# Patient Record
Sex: Female | Born: 1985 | Race: White | Hispanic: No | Marital: Married | Smoking: Never smoker
Health system: Southern US, Community
[De-identification: ages and names within clinical notes are randomized; demographics above are authoritative.]

## PROBLEM LIST (undated history)

## (undated) ENCOUNTER — Inpatient Hospital Stay (HOSPITAL_COMMUNITY): Payer: Self-pay

## (undated) DIAGNOSIS — D649 Anemia, unspecified: Secondary | ICD-10-CM

## (undated) DIAGNOSIS — N920 Excessive and frequent menstruation with regular cycle: Secondary | ICD-10-CM

## (undated) DIAGNOSIS — N879 Dysplasia of cervix uteri, unspecified: Secondary | ICD-10-CM

## (undated) DIAGNOSIS — Z8481 Family history of carrier of genetic disease: Secondary | ICD-10-CM

## (undated) DIAGNOSIS — Z9889 Other specified postprocedural states: Secondary | ICD-10-CM

## (undated) DIAGNOSIS — R87629 Unspecified abnormal cytological findings in specimens from vagina: Secondary | ICD-10-CM

## (undated) DIAGNOSIS — G43909 Migraine, unspecified, not intractable, without status migrainosus: Secondary | ICD-10-CM

## (undated) DIAGNOSIS — F3281 Premenstrual dysphoric disorder: Secondary | ICD-10-CM

## (undated) DIAGNOSIS — Z803 Family history of malignant neoplasm of breast: Secondary | ICD-10-CM

## (undated) DIAGNOSIS — R112 Nausea with vomiting, unspecified: Secondary | ICD-10-CM

## (undated) DIAGNOSIS — O24419 Gestational diabetes mellitus in pregnancy, unspecified control: Secondary | ICD-10-CM

## (undated) DIAGNOSIS — N200 Calculus of kidney: Secondary | ICD-10-CM

## (undated) DIAGNOSIS — Z319 Encounter for procreative management, unspecified: Secondary | ICD-10-CM

## (undated) DIAGNOSIS — Z853 Personal history of malignant neoplasm of breast: Secondary | ICD-10-CM

## (undated) DIAGNOSIS — Z87442 Personal history of urinary calculi: Secondary | ICD-10-CM

## (undated) HISTORY — DX: Unspecified abnormal cytological findings in specimens from vagina: R87.629

## (undated) HISTORY — DX: Premenstrual dysphoric disorder: F32.81

## (undated) HISTORY — DX: Family history of carrier of genetic disease: Z84.81

## (undated) HISTORY — DX: Excessive and frequent menstruation with regular cycle: N92.0

## (undated) HISTORY — DX: Family history of malignant neoplasm of breast: Z80.3

## (undated) HISTORY — DX: Anemia, unspecified: D64.9

## (undated) HISTORY — DX: Dysplasia of cervix uteri, unspecified: N87.9

## (undated) HISTORY — DX: Personal history of malignant neoplasm of breast: Z85.3

## (undated) HISTORY — PX: TONSILLECTOMY: SUR1361

## (undated) HISTORY — PX: WISDOM TOOTH EXTRACTION: SHX21

## (undated) HISTORY — DX: Encounter for procreative management, unspecified: Z31.9

---
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
## 2004-05-10 ENCOUNTER — Emergency Department (HOSPITAL_COMMUNITY): Admission: EM | Admit: 2004-05-10 | Discharge: 2004-05-10 | Payer: Self-pay | Admitting: Family Medicine

## 2004-07-18 ENCOUNTER — Emergency Department (HOSPITAL_COMMUNITY): Admission: EM | Admit: 2004-07-18 | Discharge: 2004-07-18 | Payer: Self-pay | Admitting: Emergency Medicine

## 2004-08-03 ENCOUNTER — Ambulatory Visit: Payer: Self-pay | Admitting: Oncology

## 2004-09-11 ENCOUNTER — Ambulatory Visit: Payer: Self-pay | Admitting: Oncology

## 2006-05-07 HISTORY — PX: LEEP: SHX91

## 2007-05-08 DIAGNOSIS — D493 Neoplasm of unspecified behavior of breast: Secondary | ICD-10-CM

## 2007-05-08 HISTORY — DX: Neoplasm of unspecified behavior of breast: D49.3

## 2007-05-08 HISTORY — PX: OTHER SURGICAL HISTORY: SHX169

## 2007-08-13 ENCOUNTER — Ambulatory Visit (HOSPITAL_COMMUNITY): Admission: RE | Admit: 2007-08-13 | Discharge: 2007-08-13 | Payer: Self-pay | Admitting: General Surgery

## 2007-08-13 ENCOUNTER — Encounter (INDEPENDENT_AMBULATORY_CARE_PROVIDER_SITE_OTHER): Payer: Self-pay | Admitting: General Surgery

## 2008-04-12 ENCOUNTER — Other Ambulatory Visit: Admission: RE | Admit: 2008-04-12 | Discharge: 2008-04-12 | Payer: Self-pay | Admitting: Obstetrics and Gynecology

## 2008-04-17 ENCOUNTER — Inpatient Hospital Stay (HOSPITAL_COMMUNITY): Admission: AD | Admit: 2008-04-17 | Discharge: 2008-04-17 | Payer: Self-pay | Admitting: Obstetrics & Gynecology

## 2008-05-07 HISTORY — PX: KIDNEY STONE SURGERY: SHX686

## 2008-10-02 ENCOUNTER — Inpatient Hospital Stay (HOSPITAL_COMMUNITY): Admission: AD | Admit: 2008-10-02 | Discharge: 2008-10-03 | Payer: Self-pay | Admitting: Obstetrics & Gynecology

## 2008-10-02 ENCOUNTER — Ambulatory Visit: Payer: Self-pay | Admitting: Advanced Practice Midwife

## 2008-10-08 ENCOUNTER — Inpatient Hospital Stay (HOSPITAL_COMMUNITY): Admission: AD | Admit: 2008-10-08 | Discharge: 2008-10-08 | Payer: Self-pay | Admitting: Obstetrics & Gynecology

## 2008-10-08 ENCOUNTER — Ambulatory Visit: Payer: Self-pay | Admitting: Obstetrics and Gynecology

## 2008-10-27 ENCOUNTER — Ambulatory Visit: Payer: Self-pay | Admitting: Physician Assistant

## 2008-10-27 ENCOUNTER — Inpatient Hospital Stay (HOSPITAL_COMMUNITY): Admission: AD | Admit: 2008-10-27 | Discharge: 2008-10-27 | Payer: Self-pay | Admitting: Obstetrics & Gynecology

## 2008-11-06 ENCOUNTER — Inpatient Hospital Stay (HOSPITAL_COMMUNITY): Admission: AD | Admit: 2008-11-06 | Discharge: 2008-11-10 | Payer: Self-pay | Admitting: Obstetrics and Gynecology

## 2008-11-06 ENCOUNTER — Ambulatory Visit: Payer: Self-pay | Admitting: Obstetrics & Gynecology

## 2008-11-07 ENCOUNTER — Encounter: Payer: Self-pay | Admitting: Obstetrics & Gynecology

## 2008-12-17 ENCOUNTER — Emergency Department (HOSPITAL_COMMUNITY): Admission: EM | Admit: 2008-12-17 | Discharge: 2008-12-17 | Payer: Self-pay | Admitting: Emergency Medicine

## 2009-01-12 ENCOUNTER — Emergency Department (HOSPITAL_COMMUNITY): Admission: EM | Admit: 2009-01-12 | Discharge: 2009-01-12 | Payer: Self-pay | Admitting: Emergency Medicine

## 2009-01-13 ENCOUNTER — Inpatient Hospital Stay (HOSPITAL_COMMUNITY): Admission: EM | Admit: 2009-01-13 | Discharge: 2009-01-16 | Payer: Self-pay | Admitting: Emergency Medicine

## 2009-01-15 ENCOUNTER — Other Ambulatory Visit: Payer: Self-pay | Admitting: Urology

## 2009-03-02 ENCOUNTER — Ambulatory Visit: Payer: Self-pay | Admitting: Obstetrics and Gynecology

## 2009-05-07 DIAGNOSIS — N200 Calculus of kidney: Secondary | ICD-10-CM

## 2009-05-07 HISTORY — DX: Calculus of kidney: N20.0

## 2009-06-15 ENCOUNTER — Ambulatory Visit: Payer: Self-pay | Admitting: Family Medicine

## 2010-01-08 ENCOUNTER — Emergency Department (HOSPITAL_COMMUNITY): Admission: EM | Admit: 2010-01-08 | Discharge: 2010-01-08 | Payer: Self-pay | Admitting: Family Medicine

## 2010-08-11 LAB — HEPATIC FUNCTION PANEL
Alkaline Phosphatase: 66 U/L (ref 39–117)
Bilirubin, Direct: 0.3 mg/dL (ref 0.0–0.3)
Indirect Bilirubin: 0.5 mg/dL (ref 0.3–0.9)
Total Bilirubin: 0.8 mg/dL (ref 0.3–1.2)

## 2010-08-11 LAB — URINALYSIS, ROUTINE W REFLEX MICROSCOPIC
Ketones, ur: NEGATIVE mg/dL
Ketones, ur: NEGATIVE mg/dL
Nitrite: NEGATIVE
Protein, ur: 100 mg/dL — AB
Urobilinogen, UA: 0.2 mg/dL (ref 0.0–1.0)
Urobilinogen, UA: 0.2 mg/dL (ref 0.0–1.0)

## 2010-08-11 LAB — CULTURE, BLOOD (ROUTINE X 2)
Culture: NO GROWTH
Report Status: 9142010

## 2010-08-11 LAB — CBC
HCT: 28 % — ABNORMAL LOW (ref 36.0–46.0)
Hemoglobin: 8.3 g/dL — ABNORMAL LOW (ref 12.0–15.0)
Hemoglobin: 9.1 g/dL — ABNORMAL LOW (ref 12.0–15.0)
Hemoglobin: 9.6 g/dL — ABNORMAL LOW (ref 12.0–15.0)
MCHC: 34.2 g/dL (ref 30.0–36.0)
MCHC: 34.2 g/dL (ref 30.0–36.0)
MCHC: 34.6 g/dL (ref 30.0–36.0)
MCHC: 34.8 g/dL (ref 30.0–36.0)
MCV: 79.3 fL (ref 78.0–100.0)
MCV: 79.8 fL (ref 78.0–100.0)
MCV: 80.1 fL (ref 78.0–100.0)
MCV: 80.2 fL (ref 78.0–100.0)
Platelets: 165 10*3/uL (ref 150–400)
RBC: 3.32 MIL/uL — ABNORMAL LOW (ref 3.87–5.11)
RBC: 3.5 MIL/uL — ABNORMAL LOW (ref 3.87–5.11)
RDW: 16 % — ABNORMAL HIGH (ref 11.5–15.5)
WBC: 4.6 10*3/uL (ref 4.0–10.5)
WBC: 8.3 10*3/uL (ref 4.0–10.5)

## 2010-08-11 LAB — DIFFERENTIAL
Basophils Absolute: 0 10*3/uL (ref 0.0–0.1)
Basophils Absolute: 0 10*3/uL (ref 0.0–0.1)
Basophils Relative: 0 % (ref 0–1)
Basophils Relative: 0 % (ref 0–1)
Basophils Relative: 1 % (ref 0–1)
Eosinophils Absolute: 0 10*3/uL (ref 0.0–0.7)
Eosinophils Absolute: 0 10*3/uL (ref 0.0–0.7)
Eosinophils Absolute: 0.1 10*3/uL (ref 0.0–0.7)
Eosinophils Absolute: 0.1 10*3/uL (ref 0.0–0.7)
Eosinophils Relative: 0 % (ref 0–5)
Eosinophils Relative: 0 % (ref 0–5)
Lymphs Abs: 0.5 10*3/uL — ABNORMAL LOW (ref 0.7–4.0)
Lymphs Abs: 0.7 10*3/uL (ref 0.7–4.0)
Monocytes Absolute: 0.5 10*3/uL (ref 0.1–1.0)
Monocytes Absolute: 0.7 10*3/uL (ref 0.1–1.0)
Monocytes Relative: 6 % (ref 3–12)
Neutro Abs: 2 10*3/uL (ref 1.7–7.7)
Neutro Abs: 3.2 10*3/uL (ref 1.7–7.7)
Neutrophils Relative %: 53 % (ref 43–77)
Neutrophils Relative %: 70 % (ref 43–77)
Neutrophils Relative %: 87 % — ABNORMAL HIGH (ref 43–77)
Neutrophils Relative %: 88 % — ABNORMAL HIGH (ref 43–77)

## 2010-08-11 LAB — URINE CULTURE: Special Requests: POSITIVE

## 2010-08-11 LAB — BASIC METABOLIC PANEL
CO2: 20 mEq/L (ref 19–32)
CO2: 25 mEq/L (ref 19–32)
Calcium: 8.4 mg/dL (ref 8.4–10.5)
Chloride: 100 mEq/L (ref 96–112)
Chloride: 104 mEq/L (ref 96–112)
Creatinine, Ser: 0.8 mg/dL (ref 0.4–1.2)
Creatinine, Ser: 0.93 mg/dL (ref 0.4–1.2)
Creatinine, Ser: 2.45 mg/dL — ABNORMAL HIGH (ref 0.4–1.2)
GFR calc Af Amer: 29 mL/min — ABNORMAL LOW (ref >60)
GFR calc Af Amer: >60 mL/min (ref >60)
Glucose, Bld: 88 mg/dL (ref 70–99)
Potassium: 3.4 mEq/L — ABNORMAL LOW (ref 3.5–5.1)
Sodium: 135 mEq/L (ref 135–145)

## 2010-08-11 LAB — SEDIMENTATION RATE: Sed Rate: 100 mm/hr — ABNORMAL HIGH (ref 0–22)

## 2010-08-11 LAB — PROTEIN AND GLUCOSE, CSF: Glucose, CSF: 49 mg/dL (ref 43–76)

## 2010-08-11 LAB — COMPREHENSIVE METABOLIC PANEL
ALT: 47 U/L — ABNORMAL HIGH (ref 0–35)
AST: 45 U/L — ABNORMAL HIGH (ref 0–37)
CO2: 22 mEq/L (ref 19–32)
Calcium: 8.6 mg/dL (ref 8.4–10.5)
Chloride: 103 mEq/L (ref 96–112)
Creatinine, Ser: 1.3 mg/dL — ABNORMAL HIGH (ref 0.4–1.2)
GFR calc Af Amer: >60 mL/min (ref >60)
GFR calc non Af Amer: 50 mL/min — ABNORMAL LOW (ref >60)
Glucose, Bld: 86 mg/dL (ref 70–99)
Sodium: 136 mEq/L (ref 135–145)
Total Bilirubin: 0.8 mg/dL (ref 0.3–1.2)

## 2010-08-11 LAB — ANA: Anti Nuclear Antibody(ANA): NEGATIVE

## 2010-08-11 LAB — RHEUMATOID FACTOR: Rhuematoid fact SerPl-aCnc: 22 IU/mL — ABNORMAL HIGH (ref 0–20)

## 2010-08-11 LAB — CSF CELL COUNT WITH DIFFERENTIAL
RBC Count, CSF: 0 /mm3
RBC Count, CSF: 1 /mm3 — ABNORMAL HIGH
Tube #: 1
Tube #: 4

## 2010-08-11 LAB — CARDIAC PANEL(CRET KIN+CKTOT+MB+TROPI)
Relative Index: INVALID (ref 0.0–2.5)
Relative Index: INVALID (ref 0.0–2.5)
Relative Index: INVALID (ref 0.0–2.5)
Total CK: 10 U/L (ref 7–177)
Total CK: 12 U/L (ref 7–177)
Troponin I: 0.03 ng/mL (ref 0.00–0.06)
Troponin I: 0.05 ng/mL (ref 0.00–0.06)

## 2010-08-11 LAB — CSF CULTURE W GRAM STAIN: Culture: NO GROWTH

## 2010-08-11 LAB — TSH: TSH: 1.471 u[IU]/mL (ref 0.350–4.500)

## 2010-08-11 LAB — PREGNANCY, URINE: Preg Test, Ur: NEGATIVE

## 2010-08-11 LAB — URINE MICROSCOPIC-ADD ON

## 2010-08-12 LAB — PREGNANCY, URINE: Preg Test, Ur: NEGATIVE

## 2010-08-12 LAB — URINE MICROSCOPIC-ADD ON

## 2010-08-12 LAB — URINALYSIS, ROUTINE W REFLEX MICROSCOPIC
Bilirubin Urine: NEGATIVE
Leukocytes, UA: NEGATIVE
Nitrite: NEGATIVE
Specific Gravity, Urine: >1.03 — ABNORMAL HIGH (ref 1.005–1.030)
Urobilinogen, UA: 0.2 mg/dL (ref 0.0–1.0)
pH: 5.5 (ref 5.0–8.0)

## 2010-08-13 LAB — CBC
HCT: 23.6 % — ABNORMAL LOW (ref 36.0–46.0)
Hemoglobin: 10.5 g/dL — ABNORMAL LOW (ref 12.0–15.0)
Hemoglobin: 8.1 g/dL — ABNORMAL LOW (ref 12.0–15.0)
MCHC: 34 g/dL (ref 30.0–36.0)
MCV: 86.2 fL (ref 78.0–100.0)
Platelets: 122 10*3/uL — ABNORMAL LOW (ref 150–400)
Platelets: 146 10*3/uL — ABNORMAL LOW (ref 150–400)
Platelets: 179 10*3/uL (ref 150–400)
RBC: 2.7 MIL/uL — ABNORMAL LOW (ref 3.87–5.11)
RBC: 2.74 MIL/uL — ABNORMAL LOW (ref 3.87–5.11)
RDW: 15 % (ref 11.5–15.5)
WBC: 10.6 10*3/uL — ABNORMAL HIGH (ref 4.0–10.5)
WBC: 10.7 10*3/uL — ABNORMAL HIGH (ref 4.0–10.5)

## 2010-08-13 LAB — RPR: RPR Ser Ql: NONREACTIVE

## 2010-08-14 LAB — URINALYSIS, ROUTINE W REFLEX MICROSCOPIC
Glucose, UA: NEGATIVE mg/dL
Ketones, ur: NEGATIVE mg/dL
Nitrite: NEGATIVE
Nitrite: NEGATIVE
Specific Gravity, Urine: 1.02 (ref 1.005–1.030)
Specific Gravity, Urine: 1.015 (ref 1.005–1.030)
Urobilinogen, UA: 0.2 mg/dL (ref 0.0–1.0)
pH: 7.5 (ref 5.0–8.0)
pH: 7.5 (ref 5.0–8.0)

## 2010-08-14 LAB — CBC
HCT: 30.3 % — ABNORMAL LOW (ref 36.0–46.0)
Hemoglobin: 10.5 g/dL — ABNORMAL LOW (ref 12.0–15.0)
Platelets: 188 10*3/uL (ref 150–400)
RDW: 14.5 % (ref 11.5–15.5)
WBC: 9.1 10*3/uL (ref 4.0–10.5)

## 2010-08-14 LAB — URINE MICROSCOPIC-ADD ON

## 2010-08-14 LAB — URINE CULTURE

## 2010-08-15 LAB — URINALYSIS, ROUTINE W REFLEX MICROSCOPIC
Bilirubin Urine: NEGATIVE
Ketones, ur: NEGATIVE mg/dL
Leukocytes, UA: NEGATIVE
Nitrite: NEGATIVE
Protein, ur: NEGATIVE mg/dL
Urobilinogen, UA: 0.2 mg/dL (ref 0.0–1.0)

## 2010-08-15 LAB — URINE MICROSCOPIC-ADD ON

## 2010-08-15 LAB — STREP B DNA PROBE: Strep Group B Ag: NEGATIVE

## 2010-09-19 NOTE — Discharge Summary (Signed)
NAMELOVELL, Julia Rangel                ACCOUNT NO.:  [id number]   MEDICAL RECORD NO.:  [id number]          PATIENT TYPE:  INP   LOCATION:  9120                          FACILITY:  [facility]   PHYSICIAN:  Horton Chin, MD DATE OF BIRTH:  Mar 05, 1986   DATE OF ADMISSION:  11/06/2008  DATE OF DISCHARGE:  11/10/2008                               DISCHARGE SUMMARY   HISTORY:  The patient was admitted as a 25 year old primigravida at 40  weeks and 6 days for an induction of labor secondary to postdate.  At  admission, the patient's problem list included:  1. Postdate pregnancy.  2. Group beta strep positive.  3. History of loop electrosurgical excision procedure.   PROCEDURES PERFORMED DURING ADMISSION:  Primary low transverse cesarean  section for failure to progress and nonreassuring fetal heart rate at  the time of draping. This procedure was performed by Dr. Elsie Lincoln  and was assisted by Zerita Boers, CNM.  Please see the  dictated  operative note for details of this operation.   POSTOPERATIVE DIAGNOSES:  1. Postdate pregnancy.  2. Group B strep positive.  3. History of loop electrosurgical excision procedure.   PERTINENT LABS:  She is A positive, antibody negative, hepatitis B  negative, and group beta strep positive.   ADMISSION IN SHORT:  The patient was initially admitted to labor and  delivery for cervical ripening with a Foley bulb secondary to postdates  pregnancy at 41 weeks by Dr. Christin Bach.  The patient's cervical  exam, cervix progressed to a rim and +2 station.  However, remained  unchanged for several hours and the decision was made to proceed with a  cesarean section.  Following the cesarean section, the patient has had  an uneventful postoperative course.  Vital signs remained stable.  Exam  within normal limits.  She is being discharged on postoperative day #3.  Her hemoglobin at the time of discharge was 8 and hematocrit was 23.2.  She is in stable status.   She is being discharged with prescriptions for  Motrin 600 mg p.o. q.6 h. p.r.n., Percocet 5/325 one-two tablets q.4-6  h. p.r.n. pain, Zoloft 50 mg daily, and ferrous sulfate 325 mg b.i.d.  The patient plans to use NuvaRing for contraception.  She was instructed to call [facility] tomorrow for followup examination  and on Monday for staple removal.  She is breast and bottle feeding and  we will have lactation consult today.  She also has an appointment on  December 07, 2008, for 6 weeks postpartum result.      Maylon Cos, C.N.M.      Horton Chin, MD  Electronically Signed    SS/MEDQ  D:  11/10/2008  T:  11/10/2008  Job:  [id number]

## 2010-09-19 NOTE — H&P (Signed)
NAMEBRISSIA, DELISA                ACCOUNT NO.:  [id number]   MEDICAL RECORD NO.:  [id number]          PATIENT TYPE:  AMB   LOCATION:  DAY                           FACILITY:  [facility]   PHYSICIAN:  Tilford Pillar, MD      DATE OF BIRTH:  24-Jul-1985   DATE OF ADMISSION:  DATE OF DISCHARGE:  LH                              HISTORY & PHYSICAL   CHIEF COMPLAINT:  Left breast mass.   HISTORY OF PRESENT ILLNESS:  The patient is a 25 year old, extremely  pleasant female with no significant medical history who presented to my  office by referral from her primary care physician for a palpable left  breast mass.  She had noted this about 2 months ago with no significant  change in size or shape.  She has said that over the last month it has  increased in discomfort with some pain with palpation of the area.  She  has not noted any overlying skin changes, no peau d'orange, no evidence  of erythema or nipple discharge.  She has not noticed any other nodules  or masses within the left or right breast, or within either axilla.  No  history of trauma.  She has not had any weight change with neither  weight gain nor weight loss.  No fever or chills.   PAST MEDICAL HISTORY:  Anxiety.   PAST SURGICAL HISTORY:  1. Tonsils and adenoidectomies.  2. Rhinoplasty x2.   MEDICATIONS:  1. Prozac.  2. Zoloft.   ALLERGIES:  No known drug allergies.   SOCIAL HISTORY:  No tobacco, alcohol occasional, no recreational drug  use.   OCCUPATION:  She is a Runner, broadcasting/film/video.   PERTINENT FAMILY HISTORY:  She does have a significant family history of  breast cancer, both maternal and a paternal grandmother were diagnosed  with breast cancer.  She has also had a maternal aunt who has had breast  cancer as well.  There is some question of whether there is some ovarian  and possibly uterine cancer in the family.  In addition, one of her  grandmothers were 26 at age of diagnosis, the other was in her late 79s.  No other  malignancies or medical conditions seem to run in the family.   REVIEW OF SYSTEMS:  CONSTITUTIONAL:  Unremarkable.  EYES:  Unremarkable.  EARS/NOSE/THROAT:  Unremarkable.  RESPIRATORY:  She does currently have  a cough and is also on antibiotics for this.  She is unsure of the  antibiotic name.  CARDIOVASCULAR:  Unremarkable.  GASTROINTESTINAL:  Unremarkable.  GENITOURINARY:  Unremarkable.  MUSCULOSKELETAL:  Unremarkable.  SKIN:  Unremarkable.  ENDOCRINE:  Unremarkable.  NEURO:  Unremarkable.   PHYSICAL EXAMINATION:  The patient is a healthy, well-developed female.  She is calm.  She is in no acute distress.  She is alert and oriented  x3.  HEENT:  Scalp, no deformities, no masses.  EYES:  Pupils are equal, round, and reactive.  Extraocular movements are  intact.  No scleral icterus or conjunctival pallor is noted.  EARS:  No diminished hearing is appreciated on exam.  ORAL MUCOSA:  Pink, normal occlusion, moist.  NECK:  Trachea is midline.  No thyroid nodules or goiter is appreciated.  No cervical lymphadenopathy is apparent.  PULMONARY:  Unlabored respirations.  She is clear to auscultation  bilaterally with no wheezes or crackles.  CARDIOVASCULAR:  Regular rate and rhythm, no murmurs, gallops.  She has 2+ radial and dorsalis pedis pulses bilaterally.  Evaluation of her chest and breast exam:  No chest wall deformities were  noted.  She does have a palpable left breast, mobile, nontender,  symmetrical mass, approximately 2 cm in size, in the left breast at  approximately the 12 o'clock position.  No nipple discharge is noted.  No mastalgia or pain is appreciated.  No overlying erythema is noted in  either breast.  No axillary lymphadenopathy is apparent.  No additional  masses are palpated in the left breast, no masses or nodules are noted  in the right breast.  ABDOMEN:  Bowel sounds are present.  The abdomen is soft.  EXTREMITIES:  Warm and dry.   PERTINENT LABORATORY AND  RADIOGRAPHIC STUDIES:  The patient has had an  ultrasound of the left breast demonstrating a left upper outer quadrant  2 x 2 x 1 cm solid mass.  This was given a BIRADS 3 level on  radiographic interpretation.   ASSESSMENT AND PLAN:  Left breast mass:  At this time I did have a long  conversation with the patient and the patient's mother, who is present  at the time of the evaluation, in regards to this likely being a benign  process due to its size and physical characteristics, both on physical  exam as well as on ultrasound.  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  Conservative management with repeat of the ultrasound at  6 months was a possible course to undertake; however, it was also  discussed with the patient that definitive diagnosis would only be  possible with tissue biopsy and tissue pathologic evaluation.  I do feel  that conservative management would be a possible course; however, due to  the patient's anxiety, especially with the patient's family history of  breast cancer, she does wish to have this area removed.  The risks,  benefits, and alternatives of an excisional biopsy were discussed at  length with the patient and the patient's mother, including bleeding,  infection, possible hematoma, seroma, as well as the possibility of  intraoperative cardiac or pulmonary events.  At this time, I do think it  is valid to continue with planned excisional biopsy, and we will plan to  proceed in the next 1 to 2 weeks for excision.  Although my suspicion is  extremely low that this would have a malignant potential, the  possibility of pathologic diagnosis of a malignancy, and the possible  options and courses following were somewhat touched upon during our  conversation today.  Again,  I did reassure the patient that based on all physical exam findings,  history, as well as radiographic studies, that this is more likely than  not a benign process.  The patient does,  again, wish to proceed, and we  will proceed at this time for excisional biopsy of this left breast  mass.      Tilford Pillar, MD  Electronically Signed     BZ/MEDQ  D:  08/01/2007  T:  08/01/2007  Job:  [id number]   cc:   Patrica Duel, M.D.  Fax: [phone]

## 2010-09-19 NOTE — Op Note (Signed)
NAMEDAO, MEARNS                ACCOUNT NO.:  [id number]   MEDICAL RECORD NO.:  [id number]          PATIENT TYPE:  AMB   LOCATION:  DAY                           FACILITY:  [facility]   PHYSICIAN:  Tilford Pillar, MD      DATE OF BIRTH:  1985-07-27   DATE OF PROCEDURE:  08/13/2007  DATE OF DISCHARGE:                               OPERATIVE REPORT   PREOPERATIVE DIAGNOSIS:  Left breast mass.   POSTOPERATIVE DIAGNOSIS:  Left breast mass.   PROCEDURE:  Excision of left breast mass.   SURGEON:  Tilford Pillar, MD   ANESTHESIA:  General under laryngeal mask airway control and local  anesthetic 1% Sensorcaine plain.   ESTIMATED BLOOD LOSS:  Minimal.   SPECIMEN:  Left breast mass.   INDICATIONS:  The patient is a 25 year old female who presented to my  office with a mass at approximately the 12 o'clock position.  This had  been unchanged in size over the last approximately month to two months,  but due to the patient's family history of breast cancer in both her  maternal and paternal grandmothers as well as a maternal aunt, the  patient had additional concerns that this could be a premalignant lesion  and was not comfortable with this being present.  The risks, benefits,  and alternatives of excisional biopsy were discussed at length with the  patient including the risk of bleeding, infection as well as wound  dehiscence.  The patient's questions and concerns were addressed.  The  patient was consented for planned procedure.   OPERATION:  The patient was taken to the operating room and was placed  in the supine position on the operating table, at which time, the  general anesthetic was administered once the patient had a laryngeal  mask airway placed by anesthesia.  At this point, the patient's left  breast and arm were prepped with a Betadine solution.  Drapes were  placed in a standard fashion.  A marking pen was used to mark the  planned incision site along the superior border of  the areola.  A 15-  blade scalpel was then utilized to make the initial skin incision.  Electrocautery was then utilized to dissect down to the subcuticular  tissue and create the initial tunneling tract to the superior origin of  the mass.  The mass was identified.  Electrocautery was utilized to  dissect around the mass.  Some overlying tissue was also removed and  sent to pathology.  Upon removal of the mass, the wound was palpated.  There was no evidence of any residual nodules or thickened tissue in  this area.  A Hemoclip was placed near the site of dissection for future  marking, and the wound was irrigated.  Hemostasis was excellent.  At  this point, a 3-0 Vicryl was utilized to reapproximate the deep  subcuticular tissue adjacent to the incision and a 4-0 Monocryl was  utilized to reapproximate the skin edges in a running subcuticular  suture after the local anesthetic had been instilled.  With the skin  edges reapproximated, the skin  was washed and dried with a moist and dry  towel.  Benzoin was applied around the incision; 1/4-inch Steri-Strips  were placed over the incision in a herringbone pattern.  The patient was  allowed to  come out of general anesthetic.  Drapes were removed.  She was  transferred back to regular hospital bed and transferred to the  postanesthetic care unit in a stable condition.  At the conclusion of  the procedure, all instrument, sponge, and needle counts were correct.  The patient tolerated the procedure well.      Tilford Pillar, MD  Electronically Signed     BZ/MEDQ  D:  08/13/2007  T:  08/13/2007  Job:  [id number]   cc:   Patrica Duel, M.D.  Fax: [phone]

## 2010-09-19 NOTE — Op Note (Signed)
Julia Rangel, Julia Rangel NO.:  [id number]   MEDICAL RECORD NO.:  [id number]          PATIENT TYPE:  INP   LOCATION:  9145                          FACILITY:  [facility]   PHYSICIAN:  Lesly Dukes, M.D. DATE OF BIRTH:  April 15, 1986   DATE OF PROCEDURE:  11/07/2008  DATE OF DISCHARGE:                               OPERATIVE REPORT   PREOPERATIVE DIAGNOSIS:  [age] year-old para 0 female with failure  to progress and nonreassuring fetal heart tones at time of draping.   POSTOPERATIVE DIAGNOSIS:  25 year-old para 0 female with  failure to progress and nonreassuring fetal heart tones at time of  draping.   PROCEDURE:  Primary low transverse cesarean section.   SURGEON:  Lesly Dukes, MD.   ASSISTANT:  Zerita Boers, CNM.   ANESTHESIA:  Epidural.   FINDINGS:  Viable female infant, Apgars 9 at 1 minute, 9 at 5 minutes.  Vertex presentation, copious amounts of clear fluid.  Grossly normal  uterus, ovaries and fallopian tubes.   PLACENTA:  To pathology.   EBL:  1000.   COMPLICATIONS:  None.   INDICATIONS FOR PROCEDURE:  The patient is a 25 year old female who  underwent post dates induction and made it to anterior lip and +1  station.  The patient had MVUs up to 170, however was having bradycardia  as the pitocin was increased.  After several hours of anterior lip it  was decided to proceed with primary low transverse cesarean section.  At  the time of prepping and draping the abdomen the last fetal heart tone  was in the 80s, so an expeditious delivery of the fetus was done.   PROCEDURE:  After informed consent was obtained the patient was taken to  the operating room.  When epidural anesthesia was found to be adequate,  the patient was placed in dorsal supine position with a leftward tilt  and prepared and draped in the normal sterile fashion.  A Foley was in  the bladder.  A Pfannenstiel skin incision was made at the scalpel and  carried down  to the fascia.  The fascia was incised in the midline.  This incision was extended bilaterally with Mayo scissors.  The superior  and inferior aspects of the fascial incision were grasped with Kocher  clamps, tented up, and dissected off sharply and bluntly with the  underlying layers of rectus muscles.  The rectus muscles were separated  in the midline.  The peritoneum was entered sharply and extended  superiorly and inferiorly with good visualization of bladder.  The  bladder flap was created.  The bladder blade was reinserted.  The  uterine incision was made in a transverse fashion in the lower uterine  segment.  The head was lifted into the incision and the head delivered  atraumatically.  The nose and mouth were suctioned.  The rest of the  baby's body delivered easily.  The cord was clamped and cut and the baby  was handed off to the waiting pediatrician.  Arterial cord gas was sent.  Placenta delivered manually.  The uterus was  cleared of all clots and  debris.  The uterine incision was closed with 0 Vicryl in a running  locked fashion.  A second suture of 0 Vicryl was used to reinforce the  incisions made and hemostasis.  The uterus was noted to be hemostatic  off tension.  The peritoneal cavity was copiously irrigated and again  the uterus was noted to be hemostatic.  The peritoneum was closed with 0  Vicryl in a running fashion.  The rectus muscles were noted to be  hemostatic.  The fascia was closed with 0 Vicryl in a running fashion.  The subcutaneous tissue was copiously irrigated and the skin was closed  with staples.  The patient tolerated the procedure well.  The sponge,  lap, instrument and needle count were correct x2 and the patient went to  the recovery room in stable condition.      Lesly Dukes, M.D.  Electronically Signed     KHL/MEDQ  D:  11/07/2008  T:  11/07/2008  Job:  [id number]

## 2010-09-19 NOTE — Assessment & Plan Note (Signed)
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  She also has a history of abnormal Pap smear in July 2008, treated with  a LEEP procedure.   MEDICATIONS:  She is not currently taking any medications.   ALLERGIES:  Denies any drug allergies.   FAMILY HISTORY:  Significant for a maternal grandmother diagnosed with  breast cancer at the age of 33 and a paternal grandmother diagnosed with  breast cancer at the age of 29.  She also had a maternal aunt diagnosed  with breast cancer, the age is unclear.  All had bilateral mastectomies  and one had metastasis to the lymph nodes.  She denies any history of  ovarian, uterine or colon cancer in her family.   In view of her family history and her personal history, the patient is  very much interested in BRCA testing.  Information on BRCA testing was  provided.  Explanation of the test as well as the results were  explained.  The patient is interested in moving forward.  The patient is  currently on Medicaid and financial planning for the testing is in  progress.           ______________________________  Catalina Antigua, MD     PC/MEDQ  D:  03/02/2009  T:  03/03/2009  Job:  [id number]

## 2010-09-19 NOTE — Discharge Summary (Signed)
Julia Rangel, Julia Rangel                ACCOUNT NO.:  [id number]   MEDICAL RECORD NO.:  [id number]          PATIENT TYPE:  INP   LOCATION:  9120                          FACILITY:  [facility]   PHYSICIAN:  Tilda Burrow, M.D. DATE OF BIRTH:  07/06/1985   DATE OF ADMISSION:  11/06/2008  DATE OF DISCHARGE:  11/10/2008                               DISCHARGE SUMMARY   PRIMARY CARE DOCTOR:  Tilda Burrow, M.D.   DISCHARGE DIAGNOSES:  1. Induction of labor, postdate.  2. Low transverse cesarean section.  3. Group B strep positive status.  4. Depression/anxiety.   PROCEDURES:  1. Induction of labor.  2. Low transverse cesarean section secondary to failure to progress.   PERTINENT LABS:  Discharge hemoglobin is 8.1, hematocrit is 23.6, and  GBS positive.   BRIEF HOSPITAL COURSE:  Ms. [name] was admitted on the 3rd of July for  induction of labor secondary to postdate.  She was induced with Pitocin.  Eventually; however, she was noted to have failure to progress and was  given a low transverse cesarean section by Dr. Penne Lash.  Her estimated  blood loss for this procedure was 1000 mL.  Also of note, in her current  hospitalization, she was seen by the social worker for her history of  depression and anxiety.  She was prescribed Zoloft.  Otherwise, her  hospital course is uncomplicated.  She did well.  She plans to breast  and bottle feed.  Her contraceptive of choice is Mirena ring at 6 weeks.   DISCHARGE INSTRUCTIONS:  Routine postnatal instructions.   DISCHARGE MEDICATIONS:  Routine postnatal meds, ibuprofen, Colace, iron  sulfate.  She was also given Percocet for pain control p.r.n.  She is  also given Zoloft 50 mg once a day for depression and anxiety.   ISSUES TO BE FOLLOWED UP AFTER DISCHARGE:  Anemia, hemoglobin 8.1; treat  with oral iron.   FOLLOWUP APPOINTMENTS:  She has an appointment at 6 weeks, visit [facility] for routine postnatal appointment.   DISCHARGE CONDITION:  The  patient was discharged to home in stable  medical condition.      Clementeen Graham, MD      Tilda Burrow, M.D.  Electronically Signed    EC/MEDQ  D:  11/10/2008  T:  11/11/2008  Job:  [id number]   cc:   Tilda Burrow, M.D.  Fax: [phone]

## 2010-09-19 NOTE — H&P (Signed)
NAMEKEYLE, DOBY NO.:  [id number]   MEDICAL RECORD NO.:  [id number]          PATIENT TYPE:  INP   LOCATION:  9120                          [facility]ACILITY[facility]   PHYSICIAN:  Tilda Burrow, M.D. DATE OF BIRTH:  [birth year]   DATE OF ADMISSION:  11/06/2008  DATE OF DISCHARGE:                              HISTORY & PHYSICAL   This is an admission to [facility], Labor and  Delivery on November 06, 2008, at 7:30 p.m.   ADMITTING DIAGNOSES:  Pregnancy 40 plus 6 weeks to 42.[redacted] weeks gestation,  post date pregnancy, group B positive, group B streptococcus carrier  status, history of loop electrosurgical excision procedure conization of  the cervix in 2008, scheduled for induction of labor for post date  pregnancy.   HISTORY OF PRESENT ILLNESS:  This is a 25 year old female gravida 1,  para 0, LMP January 18, 2008, placing menstrual EDC on October 23, 2008,  which places her at 42 weeks on July 3.  First trimester ultrasound  March 23, 2008, at 8 weeks 2 days, suggesting [facility] October 31, 2008,  placing her at 40 weeks and 6 days and 20-week ultrasound corresponds  with [facility] of October 28, 2008.  She was admitted on the evening of November 06, 2008, for induction of labor, likely to be Foley bulb ripening and  Pitocin induction of labor.  The patient has been getting evaluated by  NST at [facility] with reactive NST on November 04, 2008.  Cervix was  1 cm, 50%, -2 and posterior with a small firm exterior rim of tissue  when seen November 01, 2008.  The patient is aware of the indications for  induction, favors induction and understands the associated risks of  failed efforts in vaginal delivery.   PERTINENT LABS:  Blood type A positive.  Zoster immunity present.  Antibody screen negative.  Rubella immune present.  Hemoglobin 12,  hematocrit 37, platelets 176,000.  Hepatitis, HIV, RPR, GC, and  chlamydia all negative.  Group B strep was positive on urine sample done  at [facility].  On June 28th, hemoglobin was 10.9, hematocrit 33,  and glucose tolerance test is 92 mg%.  Plans epidural, has a female  infant, plans to breast feed, will be taken today to Dr. Phillips Odor at  [facility].   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   ALLERGIES:  None.   HABITS:  Negative for cigarettes, alcohol, and recreational drugs.   SOCIAL HISTORY:  Single.  Lives with her mother and father, works as a  Airline pilot person.   FAMILY HISTORY:  Positive for diabetes, breast cancer in both sides of  the family, in the grand mothers.   PHYSICAL EXAMINATION:  VITAL SIGNS:  Height 5 feet 6 inches, weight  208.8, blood pressure 100/50.  Urinalysis negative.  ABDOMEN:  Fundal height 40 cm, vertex presentation.  Cervix 1 cm, 50%, -  2 vertex presentation.  PLAN:  Foley bulb cervical ripening, Pitocin induction of labor when  admitted November 06, 2008.      Tilda Burrow, M.D.  Electronically Signed     JVF/MEDQ  D:  11/04/2008  T:  11/05/2008  Job:  [id number]   cc:   Corrie Mckusick, M.D.  Fax: [phone]   [facility]

## 2010-09-22 NOTE — Op Note (Signed)
NAME:  Julia Rangel, Julia Rangel                          ACCOUNT NO.:  [id number]   MEDICAL RECORD NO.:  [id number]                   PATIENT TYPE:  AMB   LOCATION:  [facility]SC                                  FACILITY:  [facility]   PHYSICIAN:  Lucky Cowboy, M.D.                    DATE OF BIRTH:  June 12, 1985   DATE OF PROCEDURE:  12/31/2003  DATE OF DISCHARGE:                                 OPERATIVE REPORT   PREOPERATIVE DIAGNOSIS:  Chronic tonsillitis.   POSTOPERATIVE DIAGNOSIS:  Chronic tonsillitis.   PROCEDURE:  Tonsillectomy.   SURGEON:  Lucky Cowboy, M.D.   ANESTHESIA:  General.   ESTIMATED BLOOD LOSS:  None.   COMPLICATIONS:  None.   INDICATIONS FOR PROCEDURE:  The patient is a 25 year old female who has been  having monthly episodes of tonsillitis for the past six months.  Her pain is  now persistent, particularly on the left side.  She has been treated with  frequent courses of antibiotics.  She is having referred ear pain.  For  these reasons, a tonsillectomy is performed.   FINDINGS:  The patient was noted to have scarred bilateral palatine tonsils  with deep cryptic debris.   DESCRIPTION OF PROCEDURE:  The patient was taken to the operating room and  placed on the table in the supine position. She was then placed under  general endotracheal anesthesia and the table rotated counter clockwise 90  degrees.  The neck was gently extended using a shoulder roll.  The Crowe-  Davis mouth gag with a #3 tongue blade was then placed intraorally, opened  and suspended on the Mayo stand.  Palpation of the soft palate was without  evidence of a submucosal cleft.  The right palatine tonsil was grasped with  Allis clamps and directed inferomedial.  The harmonic scalpel was then used  to excise the tonsil staying within the peritonsillar space adjacent to the  tonsillar capsule.  The left palatine tonsil was removed in an identical  fashion.  An NG tube was placed down the esophagus for suctioning of  the  gastric contents.  The mouth gag was removed noting no damage to the teeth  or  soft tissues.  The patient was awakened from anesthesia and the table  rotated counter clockwise 90 degrees to its original position.  The patient  was extubated in the operating room and taken to the post-anesthesia care  unit in stable condition.  There were no complications.                                               Lucky Cowboy, M.D.    SJ/MEDQ  D:  12/31/2003  T:  01/01/2004  Job:  [id number]   cc:   Patrica Duel, M.D.  [address]  [place]  [place] [address]  Fax: [phone]

## 2010-10-16 ENCOUNTER — Emergency Department (HOSPITAL_COMMUNITY)
Admission: EM | Admit: 2010-10-16 | Discharge: 2010-10-16 | Disposition: A | Discharge status: Home / Self Care | Payer: Self-pay | Attending: Emergency Medicine | Admitting: Emergency Medicine

## 2010-10-16 DIAGNOSIS — R112 Nausea with vomiting, unspecified: Secondary | ICD-10-CM | POA: Insufficient documentation

## 2010-10-16 DIAGNOSIS — E860 Dehydration: Secondary | ICD-10-CM | POA: Insufficient documentation

## 2010-10-16 LAB — URINE MICROSCOPIC-ADD ON

## 2010-10-16 LAB — URINALYSIS, ROUTINE W REFLEX MICROSCOPIC
Glucose, UA: NEGATIVE mg/dL
Ketones, ur: >80 mg/dL — AB
Nitrite: NEGATIVE
Protein, ur: NEGATIVE mg/dL
Specific Gravity, Urine: >1.03 — ABNORMAL HIGH (ref 1.005–1.030)
Urobilinogen, UA: 0.2 mg/dL (ref 0.0–1.0)
pH: 6 (ref 5.0–8.0)

## 2010-10-16 LAB — POCT PREGNANCY, URINE: Preg Test, Ur: NEGATIVE

## 2010-10-16 LAB — BASIC METABOLIC PANEL WITH GFR
BUN: 17 mg/dL (ref 6–23)
CO2: 23 meq/L (ref 19–32)
Calcium: 8.5 mg/dL (ref 8.4–10.5)
Chloride: 106 meq/L (ref 96–112)
Creatinine, Ser: 0.52 mg/dL (ref 0.4–1.2)
Glucose, Bld: 93 mg/dL (ref 70–99)

## 2010-10-16 LAB — BASIC METABOLIC PANEL
GFR calc Af Amer: >60 mL/min (ref >60)
GFR calc non Af Amer: >60 mL/min (ref >60)
Potassium: 3.4 mEq/L — ABNORMAL LOW (ref 3.5–5.1)
Sodium: 138 mEq/L (ref 135–145)

## 2010-10-22 LAB — CULTURE, BLOOD (ROUTINE X 2): Culture: NO GROWTH

## 2010-12-04 ENCOUNTER — Other Ambulatory Visit (HOSPITAL_COMMUNITY): Payer: Self-pay | Admitting: Physical Medicine and Rehabilitation

## 2010-12-04 DIAGNOSIS — M545 Low back pain: Secondary | ICD-10-CM

## 2010-12-05 ENCOUNTER — Ambulatory Visit (HOSPITAL_COMMUNITY)
Admission: RE | Admit: 2010-12-05 | Discharge: 2010-12-05 | Disposition: A | Discharge status: Home / Self Care | Payer: 59 | Source: Ambulatory Visit | Attending: Physical Medicine and Rehabilitation | Admitting: Physical Medicine and Rehabilitation

## 2010-12-05 DIAGNOSIS — R209 Unspecified disturbances of skin sensation: Secondary | ICD-10-CM | POA: Insufficient documentation

## 2010-12-05 DIAGNOSIS — M545 Low back pain, unspecified: Secondary | ICD-10-CM | POA: Insufficient documentation

## 2010-12-05 DIAGNOSIS — M79609 Pain in unspecified limb: Secondary | ICD-10-CM | POA: Insufficient documentation

## 2010-12-05 DIAGNOSIS — M25559 Pain in unspecified hip: Secondary | ICD-10-CM | POA: Insufficient documentation

## 2011-01-30 LAB — CBC
HCT: 34 — ABNORMAL LOW
Hemoglobin: 12.1
MCHC: 35.5
MCV: 90.7
RBC: 3.74 — ABNORMAL LOW
WBC: 5.7

## 2011-01-30 LAB — HCG, QUANTITATIVE, PREGNANCY: hCG, Beta Chain, Quant, S: 4

## 2011-01-30 LAB — BASIC METABOLIC PANEL
CO2: 28
Chloride: 102
GFR calc Af Amer: >60
Potassium: 4
Sodium: 136

## 2011-02-09 LAB — URINALYSIS, ROUTINE W REFLEX MICROSCOPIC
Bilirubin Urine: NEGATIVE
Glucose, UA: NEGATIVE mg/dL
Ketones, ur: 15 mg/dL — AB
Nitrite: NEGATIVE
Specific Gravity, Urine: 1.02 (ref 1.005–1.030)
pH: 6 (ref 5.0–8.0)

## 2011-02-09 LAB — CBC
HCT: 36 % (ref 36.0–46.0)
Hemoglobin: 12.2 g/dL (ref 12.0–15.0)
MCHC: 33.9 g/dL (ref 30.0–36.0)
RBC: 3.92 MIL/uL (ref 3.87–5.11)
RDW: 14 % (ref 11.5–15.5)

## 2012-05-27 ENCOUNTER — Other Ambulatory Visit (HOSPITAL_COMMUNITY)
Admission: RE | Admit: 2012-05-27 | Discharge: 2012-05-27 | Disposition: A | Discharge status: Home / Self Care | Payer: BC Managed Care – PPO | Source: Ambulatory Visit | Attending: Obstetrics and Gynecology | Admitting: Obstetrics and Gynecology

## 2012-05-27 ENCOUNTER — Other Ambulatory Visit: Payer: Self-pay | Admitting: Adult Health

## 2012-05-27 DIAGNOSIS — Z01419 Encounter for gynecological examination (general) (routine) without abnormal findings: Secondary | ICD-10-CM | POA: Insufficient documentation

## 2012-08-13 ENCOUNTER — Other Ambulatory Visit (HOSPITAL_COMMUNITY): Payer: Self-pay | Admitting: Physician Assistant

## 2012-08-13 DIAGNOSIS — J019 Acute sinusitis, unspecified: Secondary | ICD-10-CM

## 2012-08-13 DIAGNOSIS — R51 Headache: Secondary | ICD-10-CM

## 2012-08-15 ENCOUNTER — Ambulatory Visit (HOSPITAL_COMMUNITY)
Admission: RE | Admit: 2012-08-15 | Discharge: 2012-08-15 | Disposition: A | Discharge status: Home / Self Care | Payer: BC Managed Care – PPO | Source: Ambulatory Visit | Attending: Physician Assistant | Admitting: Physician Assistant

## 2012-08-15 ENCOUNTER — Encounter (HOSPITAL_COMMUNITY): Payer: Self-pay

## 2012-08-15 DIAGNOSIS — G43909 Migraine, unspecified, not intractable, without status migrainosus: Secondary | ICD-10-CM | POA: Insufficient documentation

## 2012-08-15 DIAGNOSIS — J019 Acute sinusitis, unspecified: Secondary | ICD-10-CM

## 2012-08-15 DIAGNOSIS — R51 Headache: Secondary | ICD-10-CM

## 2012-08-15 HISTORY — DX: Migraine, unspecified, not intractable, without status migrainosus: G43.909

## 2012-09-19 ENCOUNTER — Encounter: Payer: Self-pay | Admitting: *Deleted

## 2012-09-19 DIAGNOSIS — Z853 Personal history of malignant neoplasm of breast: Secondary | ICD-10-CM | POA: Insufficient documentation

## 2012-09-19 DIAGNOSIS — D649 Anemia, unspecified: Secondary | ICD-10-CM | POA: Insufficient documentation

## 2012-09-19 DIAGNOSIS — N879 Dysplasia of cervix uteri, unspecified: Secondary | ICD-10-CM

## 2012-09-19 DIAGNOSIS — F3281 Premenstrual dysphoric disorder: Secondary | ICD-10-CM

## 2012-09-19 DIAGNOSIS — D069 Carcinoma in situ of cervix, unspecified: Secondary | ICD-10-CM | POA: Insufficient documentation

## 2012-09-19 DIAGNOSIS — N920 Excessive and frequent menstruation with regular cycle: Secondary | ICD-10-CM

## 2012-09-22 ENCOUNTER — Ambulatory Visit (INDEPENDENT_AMBULATORY_CARE_PROVIDER_SITE_OTHER): Payer: BC Managed Care – PPO | Admitting: Adult Health

## 2012-09-22 ENCOUNTER — Encounter: Payer: Self-pay | Admitting: Adult Health

## 2012-09-22 VITALS — BP 110/72 | Ht 66.0 in | Wt 180.0 lb

## 2012-09-22 DIAGNOSIS — Z3049 Encounter for surveillance of other contraceptives: Secondary | ICD-10-CM

## 2012-09-22 DIAGNOSIS — Z309 Encounter for contraceptive management, unspecified: Secondary | ICD-10-CM

## 2012-09-22 MED ORDER — NORETHIN ACE-ETH ESTRAD-FE 1-20 MG-MCG(24) PO CHEW: 1.0000 | CHEWABLE_TABLET | Freq: Every day | ORAL | Status: DC

## 2012-09-22 NOTE — Progress Notes (Signed)
Subjective:     Patient ID: Julia Rangel, female   DOB: 1985/12/08, 27 y.o.   MRN: [id number]  HPI Julia Rangel is back in follow up, she was having AUB with her birth control and I changed her to Minastrin, and her periods are good.  Review of Systems Patient denies any headaches, blurred vision, shortness of breath, chest pain, abdominal pain, problems with bowel movements, urination, or intercourse. No mood changes.    Reviewed past medical,surgical, social and family history. Reviewed medications and allergies.  Objective:   Physical Exam Blood pressure 110/72, height 5\' 6"  (1.676 m), weight 180 lb (81.647 kg).   Talk only---doing great loves minastrin. Assessment:      Contraceptive management    Plan:      Refilled Minastrin  And gave her discount card and samples. Number of samples 3 Lot number [id number]  Exp date jan 16. Follow up in January 2015 for physical, prn problems

## 2012-09-22 NOTE — Patient Instructions (Addendum)
Continue Minastrin Follow up in January for physical

## 2013-05-20 ENCOUNTER — Telehealth: Payer: Self-pay

## 2013-05-20 MED ORDER — FLUOXETINE HCL 20 MG PO TABS: 20.0000 mg | ORAL_TABLET | Freq: Every day | ORAL | Status: DC

## 2013-05-20 NOTE — Telephone Encounter (Signed)
Pt requesting refill on Prozac 20 mg. Pt states has P&P appt Jan 25,2015 scheduled with Belva Bertin.

## 2013-06-01 ENCOUNTER — Encounter: Payer: Self-pay | Admitting: Adult Health

## 2013-06-01 ENCOUNTER — Ambulatory Visit (INDEPENDENT_AMBULATORY_CARE_PROVIDER_SITE_OTHER): Payer: BC Managed Care – PPO | Admitting: Adult Health

## 2013-06-01 VITALS — BP 120/80 | HR 74 | Ht 66.0 in | Wt 189.0 lb

## 2013-06-01 DIAGNOSIS — Z01419 Encounter for gynecological examination (general) (routine) without abnormal findings: Secondary | ICD-10-CM

## 2013-06-01 DIAGNOSIS — Z319 Encounter for procreative management, unspecified: Secondary | ICD-10-CM

## 2013-06-01 HISTORY — DX: Encounter for procreative management, unspecified: Z31.9

## 2013-06-01 NOTE — Progress Notes (Signed)
Patient ID: Julia Rangel, female   DOB: June 12, 1985, 28 y.o.   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   Current Medications, Allergies, Past Medical History, Past Surgical History, Family History and Social History were reviewed in [employer] record.     Review of Systems: Patient denies any headaches, blurred vision, shortness of breath, chest pain, abdominal pain, problems with bowel movements, urination, or intercourse.no joint pain, moods good with Prozac.     Physical Exam:BP 120/80  Pulse 74  Ht 5\' 6"  (1.676 m)  Wt 189 lb (85.73 kg)  BMI 30.52 kg/m2  LMP 05/30/2013 General:  Well developed, well nourished, no acute distress Skin:  Warm and dry Neck:  Midline trachea, normal thyroid Lungs; Clear to auscultation bilaterally Breast:  No dominant palpable mass, retraction, or nipple discharge Cardiovascular: Regular rate and rhythm Abdomen:  Soft, non tender, no hepatosplenomegaly Pelvic:  External genitalia is normal in appearance.  The vagina is normal in appearance, has brown discharge. The cervix is bulbous.  Uterus is felt to be normal size, shape, and contour.  No                adnexal masses or tenderness noted. Extremities:  No swelling or varicosities noted Psych:  No mood changes, alert and cooperative, seems happy   Impression: Yearly gyn exam no pap Desires pregnancy    Plan: Physical in 1 year Discussed timing of sex, and it could take 6-18 months of active trying, and take Prenatal vitamins with folic acid Ok to have some caffeine, like 1 cup of coffee Ok to continue prozac, and discussed with Dr Elonda Husky Follow with any questions or when  pregnant Review handout on preparing for pregnancy

## 2013-06-01 NOTE — Patient Instructions (Signed)
Physical in 1 year Return with pregnancy Take prenatal vitamins Preparing for Pregnancy Preparing for pregnancy (preconceptual care) by getting counseling and information from your caregiver before getting pregnant is a good idea. It will help you and your baby have a better chance to have a healthy, safe pregnancy and delivery of your baby. Make an appointment with your caregiver to talk about your health, medical, and family history and how to prepare yourself before getting pregnant. Your caregiver will do a complete physical exam and a Pap test. They will want to know:  About you, your spouse or partner, and your family's medical and genetic history.  If you are eating a balanced diet and drinking enough fluids.  What vitamins and mineral supplements you are taking. This includes taking folic acid before getting pregnant to help prevent birth defects.  What medications you are taking including prescription, over-the-counter and herbal medications.  If there is any substance abuse like alcohol, smoking, and illegal drugs.  If there is any mental or physical domestic violence.  If there is any risk of sexually transmitted disease between you and your partner.  What immunizations and vaccinations you have had and what you may need before getting pregnant.  If you should get tested for HIV infection.  If there is any exposure to chemical or toxic substances at home or work.  If there are medical problems you have that need to be treated and kept under control before getting pregnant such as diabetes, high blood pressure or others.  If there were any past surgeries, pregnancies and problems with them.  What your current weight is and to set a goal as to how much weight you should gain while pregnant. Also, they will check if you should lose or gain weight before getting pregnant.  What is your exercise routine and what it is safe when you are pregnant.  If there are any physical  disabilities that need to be addressed.  About spacing your pregnancies when there are other children.  If there is a financial problem that may affect you having a child. After talking about the above points with your caregiver, your caregiver will give you advice on how to help treat and work with you on solving any issues, if necessary, before getting pregnant. The goal is to have a healthy and safe pregnancy for you and your baby. You should keep an accurate record of your menstrual periods because it will help in determining your due date. Immunizations that you should have before getting pregnant:   Regular measles, [place] measles (rubella) and mumps.  Tetanus and diphtheria.  Chickenpox, if not immune.  Herpes zoster (Varicella) if not immune.  Human papilloma virus vaccine (HPV) between the age of 3 and 29 years old.  Hepatitis A vaccine.  Hepatitis B vaccine.  Influenza vaccine.  Pneumococcal vaccine (pneumonia). You should avoid getting pregnant for one month after getting vaccinated with a live virus vaccine such as [place] measles (rubella) which is in the MMR (Measles, Mumps and Rubella) vaccine. Other immunizations may be necessary depending on where you live, such as malaria. Ask your caregiver if any other immunizations are needed for you. HOME CARE INSTRUCTIONS   Follow the advice of your caregiver.  Before getting pregnant:  Begin taking vitamins, supplements, and 0.4 milligrams folic acid daily.  Get your immunizations up-to-date.  Get help from a nutrition counselor if you do not understand what a balanced diet is, need help with a special medical diet  or if you need help to lose or gain weight.  Begin exercising.  Stop smoking, taking illegal drugs, and drinking alcoholic beverages.  Get counseling if there is and type of domestic violence.  Get checked for sexually transmitted diseases including HIV.  Get any medical problems under control (diabetes,  high blood pressure, convulsions, asthma or others).  Resolve any financial concerns or create a plan to do so.  Be sure you and your spouse or partner are ready to have a baby.  Keep an accurate record of your menstrual periods. Document Released: 04/05/2008 Document Revised: 02/11/2013 Document Reviewed: 04/05/2008 [facility] Patient Information 2014 [place].

## 2013-07-24 ENCOUNTER — Encounter (INDEPENDENT_AMBULATORY_CARE_PROVIDER_SITE_OTHER): Payer: Self-pay

## 2013-07-24 ENCOUNTER — Ambulatory Visit (INDEPENDENT_AMBULATORY_CARE_PROVIDER_SITE_OTHER): Payer: BC Managed Care – PPO | Admitting: Obstetrics & Gynecology

## 2013-07-24 ENCOUNTER — Encounter: Payer: Self-pay | Admitting: Obstetrics & Gynecology

## 2013-07-24 VITALS — BP 124/70 | Ht 66.0 in | Wt 190.0 lb

## 2013-07-24 DIAGNOSIS — Z3201 Encounter for pregnancy test, result positive: Secondary | ICD-10-CM

## 2013-07-24 LAB — POCT URINE PREGNANCY: PREG TEST UR: POSITIVE

## 2013-07-24 NOTE — Progress Notes (Signed)
Pt here for pregnancy test. Positive result. + cramping. Advised spotting and cramping can be normal during early pregnancy. Advised if cramping gets worse, or if she starts spotting, call office. Pt voiced understanding. [place]

## 2013-08-05 ENCOUNTER — Telehealth: Payer: Self-pay | Admitting: Obstetrics and Gynecology

## 2013-08-05 MED ORDER — DOXYLAMINE-PYRIDOXINE 10-10 MG PO TBEC: 10.0000 mg | DELAYED_RELEASE_TABLET | ORAL | Status: DC

## 2013-08-05 NOTE — Telephone Encounter (Signed)
Spoke with pt. Pt is requesting a nausea med. Can you order Diclegis? Uses [facility]. Thanks!!!

## 2013-08-05 NOTE — Telephone Encounter (Signed)
Left message letting pt know Diclegis had been sent to pharmacy. Advised can come by office and get samples and coupon card while waiting on prior auth to go through. [place]

## 2013-08-12 ENCOUNTER — Telehealth: Payer: Self-pay | Admitting: Adult Health

## 2013-08-12 NOTE — Telephone Encounter (Signed)
Pt states about [redacted] weeks pregnant based on LMP having dull cramping off and on through out today, no vaginal bleeding. Pt encouraged to push fluids, take tylenol if no improvement or severe cramping or vaginal bleeding occurs to call our office back. Pt verbalized understanding. Appointment for ultrasound already scheduled for 08/24/2013.

## 2013-08-13 ENCOUNTER — Ambulatory Visit: Payer: BC Managed Care – PPO | Admitting: Obstetrics and Gynecology

## 2013-08-13 ENCOUNTER — Encounter: Payer: Self-pay | Admitting: Obstetrics and Gynecology

## 2013-08-13 ENCOUNTER — Ambulatory Visit (INDEPENDENT_AMBULATORY_CARE_PROVIDER_SITE_OTHER): Payer: BC Managed Care – PPO | Admitting: Obstetrics and Gynecology

## 2013-08-13 ENCOUNTER — Telehealth: Payer: Self-pay | Admitting: *Deleted

## 2013-08-13 VITALS — BP 110/64 | Ht 67.0 in | Wt 194.0 lb

## 2013-08-13 DIAGNOSIS — O200 Threatened abortion: Secondary | ICD-10-CM

## 2013-08-13 DIAGNOSIS — Z853 Personal history of malignant neoplasm of breast: Secondary | ICD-10-CM

## 2013-08-13 DIAGNOSIS — Z331 Pregnant state, incidental: Secondary | ICD-10-CM

## 2013-08-13 DIAGNOSIS — Z1389 Encounter for screening for other disorder: Secondary | ICD-10-CM

## 2013-08-13 DIAGNOSIS — Z3480 Encounter for supervision of other normal pregnancy, unspecified trimester: Secondary | ICD-10-CM

## 2013-08-13 LAB — POCT URINALYSIS DIPSTICK
GLUCOSE UA: NEGATIVE
Ketones, UA: NEGATIVE
Leukocytes, UA: NEGATIVE
NITRITE UA: NEGATIVE
PROTEIN UA: NEGATIVE
RBC UA: 1

## 2013-08-13 NOTE — Patient Instructions (Addendum)
Informed pt of risks of first trimester bleeding. Advised her to abstain from strenuous activity or sexual activity until the bleeding has ceased.   Threatened Miscarriage Bleeding during the first 20 weeks of pregnancy is common. This is sometimes called a threatened miscarriage. This is a pregnancy that is threatening to end before the twentieth week of pregnancy. Often this bleeding stops with bed rest or decreased activities as suggested by your caregiver and the pregnancy continues without any more problems. You may be asked to not have sexual intercourse, have orgasms or use tampons until further notice. Sometimes a threatened miscarriage can progress to a complete or incomplete miscarriage. This may or may not require further treatment. Some miscarriages occur before a woman misses a menstrual period and knows she is pregnant. Miscarriages occur in 53 to 20% of all pregnancies and usually occur during the first 13 weeks of the pregnancy. The exact cause of a miscarriage is usually never known. A miscarriage is natures way of ending a pregnancy that is abnormal or would not make it to term. There are some things that may put you at risk to have a miscarriage, such as:  Hormone problems.  Infection of the uterus or cervix.  Chronic illness, diabetes for example, especially if it is not controlled.  Abnormal shaped uterus.  Fibroids in the uterus.  Incompetent cervix (the cervix is too weak to hold the baby).  Smoking.  Drinking too much alcohol. It's best not to drink any alcohol when you are pregnant.  Taking illegal drugs. TREATMENT  When a miscarriage becomes complete and all products of conception (all the tissue in the uterus) have been passed, often no treatment is needed. If you think you passed tissue, save it in a container and take it to your doctor for evaluation. If the miscarriage is incomplete (parts of the fetus or placenta remain in the uterus), further treatment may be  needed. The most common reason for further treatment is continued bleeding (hemorrhage) because pregnancy tissue did not pass out of the uterus. This often occurs if a miscarriage is incomplete. Tissue left behind may also become infected. Treatment usually is dilatation and curettage (the removal of the remaining products of pregnancy. This can be done by a simple sucking procedure (suction curettage) or a simple scraping of the inside of the uterus. This may be done in the hospital or in the caregiver's office. This is only done when your caregiver knows that there is no chance for the pregnancy to proceed to term. This is determined by physical examination, negative pregnancy test, falling pregnancy hormone count and/or, an ultrasound revealing a dead fetus. Miscarriages are often a very emotional time for prospective mothers and fathers. This is not you or your partners fault. It did not occur because of an inadequacy in you or your partner. Nearly all miscarriages occur because the pregnancy has started off wrongly. At least half of these pregnancies have a chromosomal abnormality. It is almost always not inherited. Others may have developmental problems with the fetus or placenta. This does not always show up even when the products miscarried are studied under the microscope. The miscarriage is nearly always not your fault and it is not likely that you could have prevented it from happening. If you are having emotional and grieving problems, talk to your health care provider and even seek counseling, if necessary, before getting pregnant again. You can begin trying for another pregnancy as soon as your caregiver says it is OK.  HOME CARE INSTRUCTIONS   Your caregiver may order bed rest depending on how much bleeding and cramping you are having. You may be limited to only getting up to go to the bathroom. You may be allowed to continue light activity. You may need to make arrangements for the care of your  other children and for any other responsibilities.  Keep track of the number of pads you use each day, how often you have to change pads and how saturated (soaked) they are. Record this information.  DO NOT USE TAMPONS. Do not douche, have sexual intercourse or orgasms until approved by your caregiver.  You may receive a follow up appointment for re-evaluation of your pregnancy and a repeat blood test. Re-evaluation often occurs after 2 days and again in 4 to 6 weeks. It is very important that you follow-up in the recommended time period.  If you are Rh negative and the father is Rh positive or you do not know the fathers' blood type, you may receive a shot (Rh immune globulin) to help prevent abnormal antibodies that can develop and affect the baby in any future pregnancies. SEEK IMMEDIATE MEDICAL CARE IF:  You have severe cramps in your stomach, back, or abdomen.  You have a sudden onset of severe pain in the lower part of your abdomen.  You develop chills.  You run an unexplained temperature of 101 F (38.3 C) or higher.  You pass large clots or tissue. Save any tissue for your caregiver to inspect.  Your bleeding increases or you become light-headed, weak, or have fainting episodes.  You have a gush of fluid from your vagina.  You pass out. This could mean you have a tubal (ectopic) pregnancy. Document Released: 04/23/2005 Document Revised: 07/16/2011 Document Reviewed: 12/08/2007 [facility] Patient Information 2014 [place]. Vaginal Birth After Cesarean Delivery Vaginal birth after cesarean delivery (VBAC) is giving birth vaginally after previously delivering a baby by a cesarean. In the past, if a woman had a cesarean delivery, all births afterwards would be done by cesarean delivery. This is no longer true. It can be safe for the mother to try a vaginal delivery after having a cesarean delivery.  It is important to discuss VBAC with your health care provider early in the  pregnancy so you can understand the risks, benefits, and options. It will give you time to decide what is best in your particular case. The final decision about whether to have a VBAC or repeat cesarean delivery should be between you and your health care provider. Any changes in your health or your baby's health during your pregnancy may make it necessary to change your initial decision about VBAC.  WOMEN WHO PLAN TO HAVE A VBAC SHOULD CHECK WITH THEIR HEALTH CARE PROVIDER TO BE SURE THAT:  The previous cesarean delivery was done with a low transverse uterine cut (incision) (not a vertical classical incision).   The birth canal is big enough for the baby.   There were no other operations on the uterus.   An electronic fetal monitor (EFM) will be on at all times during labor.   An operating room will be available and ready in case an emergency cesarean delivery is needed.   A health care provider and surgical nursing staff will be available at all times during labor to be ready to do an emergency delivery cesarean if necessary.   An anesthesiologist will be present in case an emergency cesarean delivery is needed.   The nursery is  prepared and has adequate personnel and necessary equipment available to care for the baby in case of an emergency cesarean delivery. BENEFITS OF VBAC  Shorter stay in the hospital.   Avoidance of risks associated with cesarean delivery, such as:  Surgical complications, such as opening of the incision or hernia in the incision.  Injury to other organs.  Fever. This can occur if an infection develops after surgery. It can also occur as a reaction to the medicine given to make you numb during the surgery.  Less blood loss and need for blood transfusions.  Lower risk of blood clots and infection.  Shorter recovery.   Decreased risk for having to remove the uterus (hysterectomy).   Decreased risk for the placenta to completely or partially cover  the opening of the uterus (placenta previa) with a future pregnancy.   Decrease risk in future labor and delivery. RISKS OF A VBAC  Tearing (rupture) of the uterus. This is occurs in less than 1% of VBACs. The risk of this happening is higher if:  Steps are taken to begin the labor process (induce labor) or stimulate or strengthen contractions (augment labor).   Medicine is used to soften (ripen) the cervix.  Having to remove the uterus (hysterectomy) if it ruptures. VBAC SHOULD NOT BE DONE IF:  The previous cesarean delivery was done with a vertical (classical) or T-shaped incision or you do not know what kind of incision was made.   You had a ruptured uterus.   You have had certain types of surgery on your uterus, such as removal of uterine fibroids. Ask your health care provider about other types of surgeries that prevent you from having a VBAC.  You have certain medical or childbirth (obstetrical) problems.   There are problems with the baby.   You have had two previous cesarean deliveries and no vaginal deliveries. OTHER FACTS TO KNOW ABOUT VBAC:  It is safe to have an epidural anesthetic with VBAC.   It is safe to turn the baby from a breech position (attempt an external cephalic version).   It is safe to try a VBAC with twins.   VBAC may not be successful if your baby weights 8.8 lb (4 kg) or more. However, weight predictions are not always accurate and should not be used alone to decide if VBAC is right for you.  There is an increased failure rate if the time between the cesarean delivery and VBAC is less than 19 months.   Your health care provider may advise against a VBAC if you have preeclampsia (high blood pressure, protein in the urine, and swelling of face and extremities).   VBAC is often successful if you previously gave birth vaginally.   VBAC is often successful when the labor starts spontaneously before the due date.   Delivering a baby  through a VBAC is similar to having a normal spontaneous vaginal delivery. Document Released: 10/14/2006 Document Revised: 02/11/2013 Document Reviewed: 11/20/2012 [facility] Patient Information 2014 [place], [place].

## 2013-08-13 NOTE — Telephone Encounter (Signed)
Pt states spoke with nurse yesterday for cramping no bleeding still having cramping now having vaginal bleeding. Pt to be here today at 1:45 pm to be evaluated.

## 2013-08-13 NOTE — Progress Notes (Signed)
   [facility] Visit  Patient name: Jaiah Weigel MRN [id number]  Date of birth: Feb 03, 1986  CC & HPI:  Tanessa Tidd is a 28 y.o. female presenting today for 1st tri bleeding at [redacted]w[redacted]d. She reports a h/o regular menses.  On [place], measured the fetus at 4 mm. Heart rate detected.  She has an OB appointment in two weeks.   ROS:  A Pos blood type  Pertinent History Reviewed:  Medical & Surgical Hx:  Reviewed: Significant for phylloides Medications: Reviewed & Updated - see associated section Social History: Reviewed -  reports that she has never smoked. She has never used smokeless tobacco.  Objective Findings:  Vitals: BP 110/64  Ht 5\' 7"  (1.702 m)  Wt 194 lb (87.998 kg)  BMI 30.38 kg/m2  LMP 06/29/2013  Physical Examination:  Pelvic - CERVIX: normal appearing cervix without discharge or lesions U/S  SINGLETON IUP WITH + CARDIAC ACTIVITY AND 4 MM FETAL POLE== 6WK.APPROX  Assessment & Plan:   Thr ab , low likelihood Will draw Pn-I Ob appt as scheduled

## 2013-08-14 LAB — CBC
HEMATOCRIT: 39.3 % (ref 36.0–46.0)
HEMOGLOBIN: 13.6 g/dL (ref 12.0–15.0)
MCH: 31.7 pg (ref 26.0–34.0)
MCHC: 34.6 g/dL (ref 30.0–36.0)
MCV: 91.6 fL (ref 78.0–100.0)
Platelets: 261 10*3/uL (ref 150–400)
RBC: 4.29 MIL/uL (ref 3.87–5.11)
RDW: 13.1 % (ref 11.5–15.5)
WBC: 6 10*3/uL (ref 4.0–10.5)

## 2013-08-14 LAB — VARICELLA ZOSTER ANTIBODY, IGG: Varicella IgG: 1457 Index — ABNORMAL HIGH (ref <135.00)

## 2013-08-14 LAB — ABO AND RH: Rh Type: POSITIVE

## 2013-08-14 LAB — TSH: TSH: 2.691 u[IU]/mL (ref 0.350–4.500)

## 2013-08-14 LAB — HCG, QUANTITATIVE, PREGNANCY: hCG, Beta Chain, Quant, S: 52721.4 m[IU]/mL

## 2013-08-14 LAB — ANTIBODY SCREEN: ANTIBODY SCREEN: NEGATIVE

## 2013-08-14 LAB — HEPATITIS B SURFACE ANTIGEN: HEP B S AG: NEGATIVE

## 2013-08-14 LAB — HIV ANTIBODY (ROUTINE TESTING W REFLEX): HIV: NONREACTIVE

## 2013-08-14 LAB — RUBELLA SCREEN: Rubella: 5.41 Index — ABNORMAL HIGH (ref <0.90)

## 2013-08-14 LAB — RPR

## 2013-08-20 ENCOUNTER — Telehealth: Payer: Self-pay

## 2013-08-20 ENCOUNTER — Other Ambulatory Visit: Payer: BC Managed Care – PPO

## 2013-08-20 MED ORDER — ONDANSETRON 4 MG PO TBDP: 4.0000 mg | ORAL_TABLET | Freq: Four times a day (QID) | ORAL | Status: DC | PRN

## 2013-08-20 NOTE — Telephone Encounter (Signed)
Message left for pt to f/u with pharmacy.

## 2013-08-20 NOTE — Telephone Encounter (Signed)
zofran rx to [facility]

## 2013-08-20 NOTE — Telephone Encounter (Signed)
Pt states Diclegis is not working taking up to 4 tablets a day with no improvement in N/V. Pt states took Zofran during previous pregnancies and was very effective.

## 2013-08-21 ENCOUNTER — Other Ambulatory Visit: Payer: Self-pay | Admitting: Obstetrics and Gynecology

## 2013-08-21 DIAGNOSIS — O3680X0 Pregnancy with inconclusive fetal viability, not applicable or unspecified: Secondary | ICD-10-CM

## 2013-08-24 ENCOUNTER — Ambulatory Visit (INDEPENDENT_AMBULATORY_CARE_PROVIDER_SITE_OTHER): Payer: BC Managed Care – PPO

## 2013-08-24 DIAGNOSIS — O3680X0 Pregnancy with inconclusive fetal viability, not applicable or unspecified: Secondary | ICD-10-CM

## 2013-08-24 NOTE — Progress Notes (Signed)
U/S(8+0wks)-single fetus, meas c/w dates, fluid wnl, anterior Gr 0 placenta, FHR-169 bpm, CRL c/w LMP dates, cx appears closed (3.4cm), bilateral adnexa appears WNL with C.L. Noted on Lt

## 2013-08-31 ENCOUNTER — Encounter: Payer: Self-pay | Admitting: Women's Health

## 2013-08-31 ENCOUNTER — Ambulatory Visit (INDEPENDENT_AMBULATORY_CARE_PROVIDER_SITE_OTHER): Payer: BC Managed Care – PPO | Admitting: Women's Health

## 2013-08-31 VITALS — BP 118/60 | Ht 66.0 in | Wt 197.0 lb

## 2013-08-31 DIAGNOSIS — Z331 Pregnant state, incidental: Secondary | ICD-10-CM

## 2013-08-31 DIAGNOSIS — Z1389 Encounter for screening for other disorder: Secondary | ICD-10-CM

## 2013-08-31 DIAGNOSIS — Z98891 History of uterine scar from previous surgery: Secondary | ICD-10-CM

## 2013-08-31 DIAGNOSIS — O210 Mild hyperemesis gravidarum: Secondary | ICD-10-CM

## 2013-08-31 DIAGNOSIS — O34219 Maternal care for unspecified type scar from previous cesarean delivery: Secondary | ICD-10-CM

## 2013-08-31 DIAGNOSIS — Z3480 Encounter for supervision of other normal pregnancy, unspecified trimester: Secondary | ICD-10-CM | POA: Insufficient documentation

## 2013-08-31 DIAGNOSIS — O209 Hemorrhage in early pregnancy, unspecified: Secondary | ICD-10-CM | POA: Insufficient documentation

## 2013-08-31 LAB — POCT URINALYSIS DIPSTICK
GLUCOSE UA: NEGATIVE
Ketones, UA: NEGATIVE
Leukocytes, UA: NEGATIVE
Nitrite, UA: NEGATIVE
Protein, UA: NEGATIVE
RBC UA: 1

## 2013-08-31 NOTE — Patient Instructions (Signed)
Nausea & Vomiting  Have saltine crackers or pretzels by your bed and eat a few bites before you raise your head out of bed in the morning  Eat small frequent meals throughout the day instead of large meals  Drink plenty of fluids throughout the day to stay hydrated, just don't drink a lot of fluids with your meals.  This can make your stomach fill up faster making you feel sick  Do not brush your teeth right after you eat  Products with real ginger are good for nausea, like ginger ale and ginger hard candy Make sure it says made with real ginger!  Sucking on sour candy like lemon heads is also good for nausea  If your prenatal vitamins make you nauseated, take them at night so you will sleep through the nausea  If you feel like you need medicine for the nausea & vomiting please let us know  If you are unable to keep any fluids or food down please let us know    Pregnancy - First Trimester During sexual intercourse, millions of sperm go into the vagina. Only 1 sperm will penetrate and fertilize the female egg while it is in the Fallopian tube. One week later, the fertilized egg implants into the wall of the uterus. An embryo begins to develop into a baby. At 6 to 8 weeks, the eyes and face are formed and the heartbeat can be seen on ultrasound. At the end of 12 weeks (first trimester), all the baby's organs are formed. Now that you are pregnant, you will want to do everything you can to have a healthy baby. Two of the most important things are to get good prenatal care and follow your caregiver's instructions. Prenatal care is all the medical care you receive before the baby's birth. It is given to prevent, find, and treat problems during the pregnancy and childbirth. PRENATAL EXAMS  During prenatal visits, your weight, blood pressure, and urine are checked. This is done to make sure you are healthy and progressing normally during the pregnancy.  A pregnant woman should gain 25 to 35 pounds  during the pregnancy. However, if you are overweight or underweight, your caregiver will advise you regarding your weight.  Your caregiver will ask and answer questions for you.  Blood work, cervical cultures, other necessary tests, and a Pap test are done during your prenatal exams. These tests are done to check on your health and the probable health of your baby. Tests are strongly recommended and done for HIV with your permission. This is the virus that causes AIDS. These tests are done because medicines can be given to help prevent your baby from being born with this infection should you have been infected without knowing it. Blood work is also used to find out your blood type, previous infections, and follow your blood levels (hemoglobin).  Low hemoglobin (anemia) is common during pregnancy. Iron and vitamins are given to help prevent this. Later in the pregnancy, blood tests for diabetes will be done along with any other tests if any problems develop.  You may need other tests to make sure you and the baby are doing well. CHANGES DURING THE FIRST TRIMESTER  Your body goes through many changes during pregnancy. They vary from person to person. Talk to your caregiver about changes you notice and are concerned about. Changes can include:  Your menstrual period stops.  The egg and sperm carry the genes that determine what you look like. Genes from you  and your partner are forming a baby. The female genes determine whether the baby is a boy or a girl.  Your body increases in girth and you may feel bloated.  Feeling sick to your stomach (nauseous) and throwing up (vomiting). If the vomiting is uncontrollable, call your caregiver.  Your breasts will begin to enlarge and become tender.  Your nipples may stick out more and become darker.  The need to urinate more. Painful urination may mean you have a bladder infection.  Tiring easily.  Loss of appetite.  Cravings for certain kinds of  food.  At first, you may gain or lose a couple of pounds.  You may have changes in your emotions from day to day (excited to be pregnant or concerned something may go wrong with the pregnancy and baby).  You may have more vivid and strange dreams. HOME CARE INSTRUCTIONS   It is very important to avoid all smoking, alcohol and non-prescribed drugs during your pregnancy. These affect the formation and growth of the baby. Avoid chemicals while pregnant to ensure the delivery of a healthy infant.  Start your prenatal visits by the 12th week of pregnancy. They are usually scheduled monthly at first, then more often in the last 2 months before delivery. Keep your caregiver's appointments. Follow your caregiver's instructions regarding medicine use, blood and lab tests, exercise, and diet.  During pregnancy, you are providing food for you and your baby. Eat regular, well-balanced meals. Choose foods such as meat, fish, milk and other low fat dairy products, vegetables, fruits, and whole-grain breads and cereals. Your caregiver will tell you of the ideal weight gain.  You can help morning sickness by keeping soda crackers at the bedside. Eat a couple before arising in the morning. You may want to use the crackers without salt on them.  Eating 4 to 5 small meals rather than 3 large meals a day also may help the nausea and vomiting.  Drinking liquids between meals instead of during meals also seems to help nausea and vomiting.  A physical sexual relationship may be continued throughout pregnancy if there are no other problems. Problems may be early (premature) leaking of amniotic fluid from the membranes, vaginal bleeding, or belly (abdominal) pain.  Exercise regularly if there are no restrictions. Check with your caregiver or physical therapist if you are unsure of the safety of some of your exercises. Greater weight gain will occur in the last 2 trimesters of pregnancy. Exercising will  help:  Control your weight.  Keep you in shape.  Prepare you for labor and delivery.  Help you lose your pregnancy weight after you deliver your baby.  Wear a good support or jogging bra for breast tenderness during pregnancy. This may help if worn during sleep too.  Ask when prenatal classes are available. Begin classes when they are offered.  Do not use hot tubs, steam rooms, or saunas.  Wear your seat belt when driving. This protects you and your baby if you are in an accident.  Avoid raw meat, uncooked cheese, cat litter boxes, and soil used by cats throughout the pregnancy. These carry germs that can cause birth defects in the baby.  The first trimester is a good time to visit your dentist for your dental health. Getting your teeth cleaned is okay. Use a softer toothbrush and brush gently during pregnancy.  Ask for help if you have financial, counseling, or nutritional needs during pregnancy. Your caregiver will be able to offer counseling for  these needs as well as refer you for other special needs.  Do not take any medicines or herbs unless told by your caregiver.  Inform your caregiver if there is any mental or physical domestic violence.  Make a list of emergency phone numbers of family, friends, hospital, and police and fire departments.  Write down your questions. Take them to your prenatal visit.  Do not douche.  Do not cross your legs.  If you have to stand for long periods of time, rotate you feet or take small steps in a circle.  You may have more vaginal secretions that may require a sanitary pad. Do not use tampons or scented sanitary pads. MEDICINES AND DRUG USE IN PREGNANCY  Take prenatal vitamins as directed. The vitamin should contain 1 milligram of folic acid. Keep all vitamins out of reach of children. Only a couple vitamins or tablets containing iron may be fatal to a baby or young child when ingested.  Avoid use of all medicines, including herbs,  over-the-counter medicines, not prescribed or suggested by your caregiver. Only take over-the-counter or prescription medicines for pain, discomfort, or fever as directed by your caregiver. Do not use aspirin, ibuprofen, or naproxen unless directed by your caregiver.  Let your caregiver also know about herbs you may be using.  Alcohol is related to a number of birth defects. This includes fetal alcohol syndrome. All alcohol, in any form, should be avoided completely. Smoking will cause low birth rate and premature babies.  Street or illegal drugs are very harmful to the baby. They are absolutely forbidden. A baby born to an addicted mother will be addicted at birth. The baby will go through the same withdrawal an adult does.  Let your caregiver know about any medicines that you have to take and for what reason you take them. SEEK MEDICAL CARE IF:  You have any concerns or worries during your pregnancy. It is better to call with your questions if you feel they cannot wait, rather than worry about them. SEEK IMMEDIATE MEDICAL CARE IF:   An unexplained oral temperature above 102 F (38.9 C) develops, or as your caregiver suggests.  You have leaking of fluid from the vagina (birth canal). If leaking membranes are suspected, take your temperature and inform your caregiver of this when you call.  There is vaginal spotting or bleeding. Notify your caregiver of the amount and how many pads are used.  You develop a bad smelling vaginal discharge with a change in the color.  You continue to feel sick to your stomach (nauseated) and have no relief from remedies suggested. You vomit blood or coffee ground-like materials.  You lose more than 2 pounds of weight in 1 week.  You gain more than 2 pounds of weight in 1 week and you notice swelling of your face, hands, feet, or legs.  You gain 5 pounds or more in 1 week (even if you do not have swelling of your hands, face, legs, or feet).  You get  exposed to [place] measles and have never had them.  You are exposed to fifth disease or chickenpox.  You develop belly (abdominal) pain. Round ligament discomfort is a common non-cancerous (benign) cause of abdominal pain in pregnancy. Your caregiver still must evaluate this.  You develop headache, fever, diarrhea, pain with urination, or shortness of breath.  You fall or are in a car accident or have any kind of trauma.  There is mental or physical violence in your home. Document  Released: 04/17/2001 Document Revised: 01/16/2012 Document Reviewed: 10/19/2008 Exit[employer] Patient Information 2014 [employer].  

## 2013-08-31 NOTE — Progress Notes (Signed)
Subjective:  Julia Rangel is a 28 y.o. G42P1001 Caucasian female at [redacted]w[redacted]d by LMP c/w 8.2wk u/s, being seen today for her first obstetrical visit.  Her obstetrical history is significant for prev c/s for FTP @ 9cm, has had some vb this pregnancy- saw [facility] 4/9, where option of VBAC was also discussed and consent was given to take home and review- she states she has decided for RLTCS b/c birthdate would be scheduled vs. expectant management w/ VBAC. Unsure if she is planning on more babies after this one, recommended considering VBAC this pregnancy if planning other pregnancies.  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 Had yearly gyn visit w/ [facility] in Jan 2015 at which time she was trying to conceive, and per her discussion w/ [facility] was OK to continue prozac. Pregnancy history fully reviewed.  Patient reports n/v better, had tried 4 diclegis daily w/o relief, so was switched to zofran which she said had worked better for her in her previous pregnancy- states it is helping much better! Some brownish-pinkish vb today. No recent sexual intercourse or constipation, no cramping. Denies cramping, uti s/s, abnormal/malodorous vag d/c, or vulvovaginal itching/irritation.  BP 118/60  Wt 197 lb (89.359 kg)  LMP 06/29/2013  HISTORY: OB History  Gravida Para Term Preterm AB SAB TAB Ectopic Multiple Living  2 1 1       1     # Outcome Date GA Lbr Len/2nd Weight Sex Delivery Anes PTL Lv  2 CUR           1 TRM 11/07/08 [redacted]w[redacted]d  8 lb 3 oz (3.714 kg) F LTCS EPI  Y     Comments: for FTP @ 9cm     Past Medical History  Diagnosis Date  . Migraines     since age 75 per patient  . 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 Dysplasia of cervix   . PMDD (premenstrual dysphoric disorder)   . Anemia   . Menorrhagia   . Patient desires pregnancy 06/01/2013   Past Surgical History  Procedure Laterality  Date  . Leep  2008  . Kidney stone surgery  2010    stent  . Cesarean section    . Phylloid removed  2009    left breast   Family History  Problem Relation Age of Onset  . Diabetes Maternal Grandmother   . Cancer Maternal Grandmother     breast  . Diabetes Paternal Grandmother   . Cancer Paternal Grandmother     breast    Exam   System:     General: Well developed & nourished, no acute distress   Skin: Warm & dry, normal coloration and turgor, no rashes   Neurologic: Alert & oriented, normal mood   Cardiovascular: Regular rate & rhythm   Respiratory: Effort & rate normal, LCTAB, acyanotic   Abdomen: Soft, non tender   Extremities: normal strength, tone   Pelvic Exam:    Perineum: Normal perineum   Vulva: Normal, no lesions   Vagina:  Normal mucosa, normal discharge   Cervix: Normal, bulbous, appears closed   Uterus: Normal size/shape/contour for GA   Thin prep pap smear neg in Jan 2015 FHR: 180 via informal transabdominal u/s   Assessment:   Pregnancy: G2P1001 Patient Active Problem List   Diagnosis Date Noted  . Supervision of  other normal pregnancy 08/31/2013    Priority: High  . First trimester bleeding 08/31/2013    Priority: High  . Previous cesarean section 08/31/2013    Priority: High  . Patient desires pregnancy 06/01/2013  . Personal history of malignant phylloides tumor of breast 09/19/2012  . Anemia 09/19/2012  . Menorrhagia 09/19/2012  . PMDD (premenstrual dysphoric disorder) 09/19/2012  . Dysplasia of cervix 09/19/2012    [redacted]w[redacted]d G2P1001 New OB visit Prev c/s for FTP, requests RLTCS VB in 1st trimester Rh+ N/V of pregnancy- on zofran H/O depression, PMDD- on prozac  Plan:  Initial labs drawn on 4/9 appt Continue prenatal vitamins Problem list reviewed and updated Reviewed n/v relief measures and warning s/s to report Reviewed recommended weight gain based on pre-gravid BMI Encouraged well-balanced diet Genetic Screening discussed  Integrated Screen: requested Cystic fibrosis screening discussed requested Ultrasound discussed; fetal survey: requested Follow up in 3 weeks for 1st it/nt, CF, and visit [place] completed Pelvic rest until at least 7d w/o any vb, increase fluids If vb becomes heavier or accompanied by cramping, let us know or go to hosp for eval  Tawnya Crook CNM, [facility] 08/31/2013 4:28 PM

## 2013-09-01 ENCOUNTER — Encounter: Payer: Self-pay | Admitting: Women's Health

## 2013-09-01 LAB — DRUG SCREEN, URINE, NO CONFIRMATION
Amphetamine Screen, Ur: NEGATIVE
BENZODIAZEPINES.: NEGATIVE
Barbiturate Quant, Ur: NEGATIVE
COCAINE METABOLITES: NEGATIVE
Creatinine,U: 135.3 mg/dL
MARIJUANA METABOLITE: NEGATIVE
Methadone: NEGATIVE
OPIATE SCREEN, URINE: NEGATIVE
PHENCYCLIDINE (PCP): NEGATIVE
Propoxyphene: NEGATIVE

## 2013-09-01 LAB — URINALYSIS, ROUTINE W REFLEX MICROSCOPIC
BILIRUBIN URINE: NEGATIVE
Glucose, UA: NEGATIVE mg/dL
Ketones, ur: NEGATIVE mg/dL
Nitrite: NEGATIVE
Protein, ur: NEGATIVE mg/dL
SPECIFIC GRAVITY, URINE: 1.026 (ref 1.005–1.030)
Urobilinogen, UA: 0.2 mg/dL (ref 0.0–1.0)
pH: 6 (ref 5.0–8.0)

## 2013-09-01 LAB — URINALYSIS, MICROSCOPIC ONLY
Bacteria, UA: NONE SEEN
CASTS: NONE SEEN
CRYSTALS: NONE SEEN

## 2013-09-01 LAB — URINE CULTURE
COLONY COUNT: NO GROWTH
ORGANISM ID, BACTERIA: NO GROWTH

## 2013-09-01 LAB — GC/CHLAMYDIA PROBE AMP
CT PROBE, AMP APTIMA: NEGATIVE
GC PROBE AMP APTIMA: NEGATIVE

## 2013-09-01 LAB — OXYCODONE SCREEN, UA, RFLX CONFIRM: Oxycodone Screen, Ur: NEGATIVE ng/mL

## 2013-09-21 ENCOUNTER — Telehealth: Payer: Self-pay | Admitting: Obstetrics and Gynecology

## 2013-09-21 ENCOUNTER — Inpatient Hospital Stay (HOSPITAL_COMMUNITY)
Admission: AD | Admit: 2013-09-21 | Discharge: 2013-09-21 | Disposition: A | Discharge status: Home / Self Care | Payer: BC Managed Care – PPO | Source: Ambulatory Visit | Attending: Obstetrics & Gynecology | Admitting: Obstetrics & Gynecology

## 2013-09-21 ENCOUNTER — Encounter (HOSPITAL_COMMUNITY): Payer: Self-pay | Admitting: *Deleted

## 2013-09-21 DIAGNOSIS — O210 Mild hyperemesis gravidarum: Secondary | ICD-10-CM | POA: Insufficient documentation

## 2013-09-21 DIAGNOSIS — Z853 Personal history of malignant neoplasm of breast: Secondary | ICD-10-CM | POA: Insufficient documentation

## 2013-09-21 DIAGNOSIS — O209 Hemorrhage in early pregnancy, unspecified: Secondary | ICD-10-CM

## 2013-09-21 DIAGNOSIS — Z3480 Encounter for supervision of other normal pregnancy, unspecified trimester: Secondary | ICD-10-CM

## 2013-09-21 DIAGNOSIS — Z98891 History of uterine scar from previous surgery: Secondary | ICD-10-CM

## 2013-09-21 DIAGNOSIS — O99891 Other specified diseases and conditions complicating pregnancy: Secondary | ICD-10-CM | POA: Insufficient documentation

## 2013-09-21 DIAGNOSIS — G43919 Migraine, unspecified, intractable, without status migrainosus: Secondary | ICD-10-CM

## 2013-09-21 DIAGNOSIS — G43909 Migraine, unspecified, not intractable, without status migrainosus: Secondary | ICD-10-CM | POA: Insufficient documentation

## 2013-09-21 DIAGNOSIS — O9989 Other specified diseases and conditions complicating pregnancy, childbirth and the puerperium: Principal | ICD-10-CM

## 2013-09-21 DIAGNOSIS — Z833 Family history of diabetes mellitus: Secondary | ICD-10-CM | POA: Insufficient documentation

## 2013-09-21 LAB — URINALYSIS, ROUTINE W REFLEX MICROSCOPIC
BILIRUBIN URINE: NEGATIVE
Glucose, UA: NEGATIVE mg/dL
HGB URINE DIPSTICK: NEGATIVE
Ketones, ur: 15 mg/dL — AB
Leukocytes, UA: NEGATIVE
Nitrite: NEGATIVE
PH: 7.5 (ref 5.0–8.0)
Protein, ur: NEGATIVE mg/dL
Specific Gravity, Urine: 1.02 (ref 1.005–1.030)
Urobilinogen, UA: 0.2 mg/dL (ref 0.0–1.0)

## 2013-09-21 MED ORDER — IBUPROFEN 800 MG PO TABS
800.0000 mg | ORAL_TABLET | Freq: Once | ORAL | Status: AC
  Administered 2013-09-21: 800 mg via ORAL
  Filled 2013-09-21: qty 1

## 2013-09-21 MED ORDER — BUTALBITAL-APAP-CAFFEINE 50-325-40 MG PO TABS: 1.0000 | ORAL_TABLET | Freq: Four times a day (QID) | ORAL | Status: DC | PRN

## 2013-09-21 MED ORDER — DEXAMETHASONE SODIUM PHOSPHATE 10 MG/ML IJ SOLN
10.0000 mg | Freq: Once | INTRAMUSCULAR | Status: AC
  Administered 2013-09-21: 10 mg via INTRAVENOUS
  Filled 2013-09-21: qty 1

## 2013-09-21 MED ORDER — LACTATED RINGERS IV BOLUS (SEPSIS)
1000.0000 mL | Freq: Once | INTRAVENOUS | Status: AC
  Administered 2013-09-21: 1000 mL via INTRAVENOUS

## 2013-09-21 MED ORDER — METOCLOPRAMIDE HCL 5 MG/ML IJ SOLN
10.0000 mg | Freq: Once | INTRAMUSCULAR | Status: AC
  Administered 2013-09-21: 10 mg via INTRAVENOUS
  Filled 2013-09-21: qty 2

## 2013-09-21 MED ORDER — DIPHENHYDRAMINE HCL 50 MG/ML IJ SOLN
12.5000 mg | Freq: Once | INTRAMUSCULAR | Status: AC
  Administered 2013-09-21: 12.5 mg via INTRAVENOUS
  Filled 2013-09-21: qty 1

## 2013-09-21 NOTE — Telephone Encounter (Signed)
Fioricet rx faxed to pharmacy. Roma Schanz, CNM, [facility] 09/21/2013 11:27 AM

## 2013-09-21 NOTE — MAU Note (Signed)
Tried Tylenol, ice packs, caffeine, etc for headache without relief;

## 2013-09-21 NOTE — Discharge Instructions (Signed)
Migraine Headache A migraine headache is an intense, throbbing pain on one or both sides of your head. A migraine can last for 30 minutes to several hours. CAUSES  The exact cause of a migraine headache is not always known. However, a migraine may be caused when nerves in the brain become irritated and release chemicals that cause inflammation. This causes pain. Certain things may also trigger migraines, such as:  Alcohol.  Smoking.  Stress.  Menstruation.  Aged cheeses.  Foods or drinks that contain nitrates, glutamate, aspartame, or tyramine.  Lack of sleep.  Chocolate.  Caffeine.  Hunger.  Physical exertion.  Fatigue.  Medicines used to treat chest pain (nitroglycerine), birth control pills, estrogen, and some blood pressure medicines. SIGNS AND SYMPTOMS  Pain on one or both sides of your head.  Pulsating or throbbing pain.  Severe pain that prevents daily activities.  Pain that is aggravated by any physical activity.  Nausea, vomiting, or both.  Dizziness.  Pain with exposure to bright lights, loud noises, or activity.  General sensitivity to bright lights, loud noises, or smells. Before you get a migraine, you may get warning signs that a migraine is coming (aura). An aura may include:  Seeing flashing lights.  Seeing bright spots, halos, or zig-zag lines.  Having tunnel vision or blurred vision.  Having feelings of numbness or tingling.  Having trouble talking.  Having muscle weakness. DIAGNOSIS  A migraine headache is often diagnosed based on:  Symptoms.  Physical exam.  A CT scan or MRI of your head. These imaging tests cannot diagnose migraines, but they can help rule out other causes of headaches. TREATMENT Medicines may be given for pain and nausea. Medicines can also be given to help prevent recurrent migraines.  HOME CARE INSTRUCTIONS  Only take over-the-counter or prescription medicines for pain or discomfort as directed by your  health care provider. The use of long-term narcotics is not recommended.  Lie down in a dark, quiet room when you have a migraine.  Keep a journal to find out what may trigger your migraine headaches. For example, write down:  What you eat and drink.  How much sleep you get.  Any change to your diet or medicines.  Limit alcohol consumption.  Quit smoking if you smoke.  Get 7 9 hours of sleep, or as recommended by your health care provider.  Limit stress.  Keep lights dim if bright lights bother you and make your migraines worse. SEEK IMMEDIATE MEDICAL CARE IF:   Your migraine becomes severe.  You have a fever.  You have a stiff neck.  You have vision loss.  You have muscular weakness or loss of muscle control.  You start losing your balance or have trouble walking.  You feel faint or pass out.  You have severe symptoms that are different from your first symptoms. MAKE SURE YOU:   Understand these instructions.  Will watch your condition.  Will get help right away if you are not doing well or get worse. Document Released: 04/23/2005 Document Revised: 02/11/2013 Document Reviewed: 12/29/2012 ExitCare Patient Information 2014 [employer].  

## 2013-09-21 NOTE — Telephone Encounter (Signed)
Pt states has migraine so bad causing her to vomit. Tylenol is not working. Pt informed per Knute Neu, will fax RX for Fioricet to [place] if no improvement pt will need to go on to [facility] Pt verbalized understanding.

## 2013-09-21 NOTE — MAU Note (Signed)
Patient states she has had a history of headaches. States she started having a headache on the left side of her head yesterday and is getting worse. Now has vomiting from the pain and is unable to keep anything down. Patient states she is having no abdominal pain, bleeding or discharge

## 2013-09-21 NOTE — MAU Provider Note (Signed)
History     CSN: [id number]  Arrival date and time: 09/21/13 1256   First Provider Initiated Contact with Patient 09/21/13 1357      Chief Complaint  Patient presents with  . Headache  . Emesis   HPI  Julia Rangel is 28 y.o. G2P1001 [redacted]w[redacted]d weeks presenting with migraine headache--nausea and vomiting, hurts to touch.  Denies loss of vision.  She took Diclegis for nausea.  She called [facility] and instructed to come here for treatment.  She has hx of migraine.  This is the first one with pregnancy.  She usually can treat with ibuprofen and excedrine but not taking with pregnancy.     Past Medical History  Diagnosis Date  . Migraines     since age 56 per patient  . 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 Dysplasia of cervix   . PMDD (premenstrual dysphoric disorder)   . Anemia   . Menorrhagia   . Patient desires pregnancy 06/01/2013    Past Surgical History  Procedure Laterality Date  . Leep  2008  . Kidney stone surgery  2010    stent  . Cesarean section    . Phylloid removed  2009    left breast    Family History  Problem Relation Age of Onset  . Diabetes Maternal Grandmother   . Cancer Maternal Grandmother     breast  . Diabetes Paternal Grandmother   . Cancer Paternal Grandmother     breast    History  Substance Use Topics  . Smoking status: Never Smoker   . Smokeless tobacco: Never Used  . Alcohol Use: No     Comment: occ    Allergies: No Known Allergies  Prescriptions prior to admission  Medication Sig Dispense Refill  . Doxylamine-Pyridoxine (DICLEGIS) 10-10 MG TBEC Take 10 mg by mouth See admin instructions.  100 tablet  4  . ondansetron (ZOFRAN ODT) 4 MG disintegrating tablet Take 1 tablet (4 mg total) by mouth every 6 (six) hours as needed for nausea.  30 tablet  2  . Prenatal Vit-Fe Sulfate-FA (PRENATAL VITAMIN PO) Take by mouth daily.        Review of Systems  Constitutional: Positive for chills. Negative  for fever.  Gastrointestinal: Positive for nausea and vomiting. Negative for abdominal pain.  Genitourinary: Negative for dysuria, urgency, frequency and hematuria.       Neg for vaginal bleeding.   Neurological: Positive for headaches.   Physical Exam   Blood pressure 127/77, pulse 89, temperature 98.5 F (36.9 C), temperature source Oral, resp. rate 16, height 5\' 6"  (1.676 m), weight 88.361 kg (194 lb 12.8 oz), last menstrual period 06/29/2013, SpO2 98.00%.  Physical Exam  Constitutional: She is oriented to person, place, and time. She appears well-developed and well-nourished.  uncomfortable  HENT:  Head: Normocephalic.  Genitourinary:  Not indicated  Neurological: She is alert and oriented to person, place, and time. No cranial nerve deficit. She exhibits normal muscle tone. Coordination normal.  Skin: Skin is warm and dry.  Psychiatric: She has a normal mood and affect. Her behavior is normal.   Results for orders placed during the hospital encounter of 09/21/13 (from the past 24 hour(s))  URINALYSIS, ROUTINE W REFLEX MICROSCOPIC     Status: Abnormal   Collection Time    09/21/13  1:15 PM      Result Value Ref Range   Color, Urine YELLOW  YELLOW  APPearance CLEAR  CLEAR   Specific Gravity, Urine 1.020  1.005 - 1.030   pH 7.5  5.0 - 8.0   Glucose, UA NEGATIVE  NEGATIVE mg/dL   Hgb urine dipstick NEGATIVE  NEGATIVE   Bilirubin Urine NEGATIVE  NEGATIVE   Ketones, ur 15 (*) NEGATIVE mg/dL   Protein, ur NEGATIVE  NEGATIVE mg/dL   Urobilinogen, UA 0.2  0.0 - 1.0 mg/dL   Nitrite NEGATIVE  NEGATIVE   Leukocytes, UA NEGATIVE  NEGATIVE    MAU Course  Procedures    Treatment for headache:  Benadryl 12.5mg  IV, Reglan 10mg  IV and Decadron 10mg  IV.  She had 1500cc LR  MDM 16:00  Reported to Dr. Gala Rangel- evaluation and treatment of patient's sxs of migraine.  Headache better after 500cc with meds infused and now returned.  Order given for Motrin 800mg  po once and ice to left side  of face. 17:15  Patient feels much better after Motrin rating her headache as 2/10.  She is ready for discharge  Assessment and Plan  A:  Migraine headache       [redacted] weeks gestation  P:  Patient instructed to follow up with Dr. Glo Rangel.        Keep appt for next week.   Julia Rangel 09/21/2013, 1:57 PM

## 2013-09-21 NOTE — MAU Note (Signed)
[facility] sent pt to be treated for Migraine HA.  Pt states the migraine started yesterday without any relief after taking tylenol and zofran.

## 2013-09-25 NOTE — MAU Provider Note (Signed)
Attestation of Attending Supervision of Advanced Practitioner (CNM/NP): Evaluation and management procedures were performed by the Advanced Practitioner under my supervision and collaboration. I have reviewed the Advanced Practitioner's note and chart, and I agree with the management and plan.  Guss Bunde 10:57 AM

## 2013-09-29 ENCOUNTER — Other Ambulatory Visit: Payer: Self-pay | Admitting: Women's Health

## 2013-09-29 ENCOUNTER — Ambulatory Visit (INDEPENDENT_AMBULATORY_CARE_PROVIDER_SITE_OTHER): Payer: BC Managed Care – PPO | Admitting: Women's Health

## 2013-09-29 ENCOUNTER — Encounter: Payer: Self-pay | Admitting: Women's Health

## 2013-09-29 ENCOUNTER — Ambulatory Visit (INDEPENDENT_AMBULATORY_CARE_PROVIDER_SITE_OTHER): Payer: BC Managed Care – PPO

## 2013-09-29 VITALS — BP 110/68 | Wt 202.0 lb

## 2013-09-29 DIAGNOSIS — Z3480 Encounter for supervision of other normal pregnancy, unspecified trimester: Secondary | ICD-10-CM

## 2013-09-29 DIAGNOSIS — O34219 Maternal care for unspecified type scar from previous cesarean delivery: Secondary | ICD-10-CM

## 2013-09-29 DIAGNOSIS — Z1389 Encounter for screening for other disorder: Secondary | ICD-10-CM

## 2013-09-29 DIAGNOSIS — Z36 Encounter for antenatal screening of mother: Secondary | ICD-10-CM

## 2013-09-29 DIAGNOSIS — Z331 Pregnant state, incidental: Secondary | ICD-10-CM

## 2013-09-29 DIAGNOSIS — O9989 Other specified diseases and conditions complicating pregnancy, childbirth and the puerperium: Secondary | ICD-10-CM

## 2013-09-29 DIAGNOSIS — Z98891 History of uterine scar from previous surgery: Secondary | ICD-10-CM

## 2013-09-29 LAB — POCT URINALYSIS DIPSTICK
GLUCOSE UA: NEGATIVE
Ketones, UA: NEGATIVE
LEUKOCYTES UA: NEGATIVE
Nitrite, UA: NEGATIVE
Protein, UA: NEGATIVE

## 2013-09-29 NOTE — Progress Notes (Signed)
U/S(13+1wks)-single IUP with +FCA noted, FHR-158 bpm, posterior Gr 0 placenta, cx appears closed (3.7cm), bilateral adnexa appears wnl, NB present, NT-1.2mm, CRL c/w dates

## 2013-09-29 NOTE — Progress Notes (Signed)
Denies cramping, lof, vb, uti s/s.  No complaints.  Went to [employer] w/ bad migraine- ate seafood night before- was told to avoid seafood from now on. Had migraines prior to pregnancy. Discussed prevention measures- can try magnesium and b2 daily. Has rx for fioricet if needed. Interested in vbac, gave consent to take home and review. Reviewed today's nt u/s, and warning s/s to report.  All questions answered. F/U in 4wks for 2nd IT and visit.  1st it/nt today.

## 2013-09-29 NOTE — Patient Instructions (Signed)
For Migraine Prevention:  Magnesium 400mg  daily  B2 400mg  daily  Can add CoQ10 100mg  TID if needed  For Migraine relief (all together):  Benadryl 25mg    Tylenol (2 regular or extra strength)  Coffee or soda  Large gatorade  Ice on back of head  Rest  Second Trimester of Pregnancy The second trimester is from week 13 through week 28, months 4 through 6. The second trimester is often a time when you feel your best. Your body has also adjusted to being pregnant, and you begin to feel better physically. Usually, morning sickness has lessened or quit completely, you may have more energy, and you may have an increase in appetite. The second trimester is also a time when the fetus is growing rapidly. At the end of the sixth month, the fetus is about 9 inches long and weighs about 1 pounds. You will likely begin to feel the baby move (quickening) between 18 and 20 weeks of the pregnancy. BODY CHANGES Your body goes through many changes during pregnancy. The changes vary from woman to woman.   Your weight will continue to increase. You will notice your lower abdomen bulging out.  You may begin to get stretch marks on your hips, abdomen, and breasts.  You may develop headaches that can be relieved by medicines approved by your caregiver.  You may urinate more often because the fetus is pressing on your bladder.  You may develop or continue to have heartburn as a result of your pregnancy.  You may develop constipation because certain hormones are causing the muscles that push waste through your intestines to slow down.  You may develop hemorrhoids or swollen, bulging veins (varicose veins).  You may have back pain because of the weight gain and pregnancy hormones relaxing your joints between the bones in your pelvis and as a result of a shift in weight and the muscles that support your balance.  Your breasts will continue to grow and be tender.  Your gums may bleed and may be  sensitive to brushing and flossing.  Dark spots or blotches (chloasma, mask of pregnancy) may develop on your face. This will likely fade after the baby is born.  A dark line from your belly button to the pubic area (linea nigra) may appear. This will likely fade after the baby is born. WHAT TO EXPECT AT YOUR PRENATAL VISITS During a routine prenatal visit:  You will be weighed to make sure you and the fetus are growing normally.  Your blood pressure will be taken.  Your abdomen will be measured to track your baby's growth.  The fetal heartbeat will be listened to.  Any test results from the previous visit will be discussed. Your caregiver may ask you:  How you are feeling.  If you are feeling the baby move.  If you have had any abnormal symptoms, such as leaking fluid, bleeding, severe headaches, or abdominal cramping.  If you have any questions. Other tests that may be performed during your second trimester include:  Blood tests that check for:  Low iron levels (anemia).  Gestational diabetes (between 24 and 28 weeks).  Rh antibodies.  Urine tests to check for infections, diabetes, or protein in the urine.  An ultrasound to confirm the proper growth and development of the baby.  An amniocentesis to check for possible genetic problems.  Fetal screens for spina bifida and Down syndrome. HOME CARE INSTRUCTIONS   Avoid all smoking, herbs, alcohol, and unprescribed drugs. These chemicals  affect the formation and growth of the baby.  Follow your caregiver's instructions regarding medicine use. There are medicines that are either safe or unsafe to take during pregnancy.  Exercise only as directed by your caregiver. Experiencing uterine cramps is a good sign to stop exercising.  Continue to eat regular, healthy meals.  Wear a good support bra for breast tenderness.  Do not use hot tubs, steam rooms, or saunas.  Wear your seat belt at all times when driving.  Avoid  raw meat, uncooked cheese, cat litter boxes, and soil used by cats. These carry germs that can cause birth defects in the baby.  Take your prenatal vitamins.  Try taking a stool softener (if your caregiver approves) if you develop constipation. Eat more high-fiber foods, such as fresh vegetables or fruit and whole grains. Drink plenty of fluids to keep your urine clear or pale yellow.  Take warm sitz baths to soothe any pain or discomfort caused by hemorrhoids. Use hemorrhoid cream if your caregiver approves.  If you develop varicose veins, wear support hose. Elevate your feet for 15 minutes, 3 4 times a day. Limit salt in your diet.  Avoid heavy lifting, wear low heel shoes, and practice good posture.  Rest with your legs elevated if you have leg cramps or low back pain.  Visit your dentist if you have not gone yet during your pregnancy. Use a soft toothbrush to brush your teeth and be gentle when you floss.  A sexual relationship may be continued unless your caregiver directs you otherwise.  Continue to go to all your prenatal visits as directed by your caregiver. SEEK MEDICAL CARE IF:   You have dizziness.  You have mild pelvic cramps, pelvic pressure, or nagging pain in the abdominal area.  You have persistent nausea, vomiting, or diarrhea.  You have a bad smelling vaginal discharge.  You have pain with urination. SEEK IMMEDIATE MEDICAL CARE IF:   You have a fever.  You are leaking fluid from your vagina.  You have spotting or bleeding from your vagina.  You have severe abdominal cramping or pain.  You have rapid weight gain or loss.  You have shortness of breath with chest pain.  You notice sudden or extreme swelling of your face, hands, ankles, feet, or legs.  You have not felt your baby move in over an hour.  You have severe headaches that do not go away with medicine.  You have vision changes. Document Released: 04/17/2001 Document Revised: 12/24/2012  Document Reviewed: 06/24/2012 [facility] Patient Information 2014 [place].  Migraine Headache A migraine headache is an intense, throbbing pain on one or both sides of your head. A migraine can last for 30 minutes to several hours. CAUSES  The exact cause of a migraine headache is not always known. However, a migraine may be caused when nerves in the brain become irritated and release chemicals that cause inflammation. This causes pain. Certain things may also trigger migraines, such as:  Alcohol.  Smoking.  Stress.  Menstruation.  Aged cheeses.  Foods or drinks that contain nitrates, glutamate, aspartame, or tyramine.  Lack of sleep.  Chocolate.  Caffeine.  Hunger.  Physical exertion.  Fatigue.  Medicines used to treat chest pain (nitroglycerine), birth control pills, estrogen, and some blood pressure medicines. SIGNS AND SYMPTOMS  Pain on one or both sides of your head.  Pulsating or throbbing pain.  Severe pain that prevents daily activities.  Pain that is aggravated by any physical activity.  Nausea, vomiting, or both.  Dizziness.  Pain with exposure to bright lights, loud noises, or activity.  General sensitivity to bright lights, loud noises, or smells. Before you get a migraine, you may get warning signs that a migraine is coming (aura). An aura may include:  Seeing flashing lights.  Seeing bright spots, halos, or zig-zag lines.  Having tunnel vision or blurred vision.  Having feelings of numbness or tingling.  Having trouble talking.  Having muscle weakness. DIAGNOSIS  A migraine headache is often diagnosed based on:  Symptoms.  Physical exam.  A CT scan or MRI of your head. These imaging tests cannot diagnose migraines, but they can help rule out other causes of headaches. TREATMENT Medicines may be given for pain and nausea. Medicines can also be given to help prevent recurrent migraines.  HOME CARE INSTRUCTIONS  Only take  over-the-counter or prescription medicines for pain or discomfort as directed by your health care provider. The use of long-term narcotics is not recommended.  Lie down in a dark, quiet room when you have a migraine.  Keep a journal to find out what may trigger your migraine headaches. For example, write down:  What you eat and drink.  How much sleep you get.  Any change to your diet or medicines.  Limit alcohol consumption.  Quit smoking if you smoke.  Get 7 9 hours of sleep, or as recommended by your health care provider.  Limit stress.  Keep lights dim if bright lights bother you and make your migraines worse. SEEK IMMEDIATE MEDICAL CARE IF:   Your migraine becomes severe.  You have a fever.  You have a stiff neck.  You have vision loss.  You have muscular weakness or loss of muscle control.  You start losing your balance or have trouble walking.  You feel faint or pass out.  You have severe symptoms that are different from your first symptoms. MAKE SURE YOU:   Understand these instructions.  Will watch your condition.  Will get help right away if you are not doing well or get worse. Document Released: 04/23/2005 Document Revised: 02/11/2013 Document Reviewed: 12/29/2012 [facility] Patient Information 2014 [place].

## 2013-10-01 LAB — CYSTIC FIBROSIS DIAGNOSTIC STUDY

## 2013-10-03 ENCOUNTER — Encounter: Payer: Self-pay | Admitting: Women's Health

## 2013-10-03 LAB — MATERNAL SCREEN, INTEGRATED #1

## 2013-10-27 ENCOUNTER — Ambulatory Visit (INDEPENDENT_AMBULATORY_CARE_PROVIDER_SITE_OTHER): Payer: BC Managed Care – PPO | Admitting: Women's Health

## 2013-10-27 VITALS — BP 106/60 | Wt 205.0 lb

## 2013-10-27 DIAGNOSIS — Z1389 Encounter for screening for other disorder: Secondary | ICD-10-CM

## 2013-10-27 DIAGNOSIS — Z3480 Encounter for supervision of other normal pregnancy, unspecified trimester: Secondary | ICD-10-CM

## 2013-10-27 DIAGNOSIS — Z3482 Encounter for supervision of other normal pregnancy, second trimester: Secondary | ICD-10-CM

## 2013-10-27 DIAGNOSIS — Z331 Pregnant state, incidental: Secondary | ICD-10-CM

## 2013-10-27 DIAGNOSIS — Z98891 History of uterine scar from previous surgery: Secondary | ICD-10-CM

## 2013-10-27 LAB — POCT URINALYSIS DIPSTICK
Glucose, UA: NEGATIVE
Ketones, UA: NEGATIVE
Leukocytes, UA: NEGATIVE
NITRITE UA: NEGATIVE
RBC UA: NEGATIVE

## 2013-10-27 NOTE — Patient Instructions (Signed)
Second Trimester of Pregnancy The second trimester is from week 13 through week 28, months 4 through 6. The second trimester is often a time when you feel your best. Your body has also adjusted to being pregnant, and you begin to feel better physically. Usually, morning sickness has lessened or quit completely, you may have more energy, and you may have an increase in appetite. The second trimester is also a time when the fetus is growing rapidly. At the end of the sixth month, the fetus is about 9 inches long and weighs about 1 pounds. You will likely begin to feel the baby move (quickening) between 18 and 20 weeks of the pregnancy. BODY CHANGES Your body goes through many changes during pregnancy. The changes vary from woman to woman.   Your weight will continue to increase. You will notice your lower abdomen bulging out.  You may begin to get stretch marks on your hips, abdomen, and breasts.  You may develop headaches that can be relieved by medicines approved by your health care provider.  You may urinate more often because the fetus is pressing on your bladder.  You may develop or continue to have heartburn as a result of your pregnancy.  You may develop constipation because certain hormones are causing the muscles that push waste through your intestines to slow down.  You may develop hemorrhoids or swollen, bulging veins (varicose veins).  You may have back pain because of the weight gain and pregnancy hormones relaxing your joints between the bones in your pelvis and as a result of a shift in weight and the muscles that support your balance.  Your breasts will continue to grow and be tender.  Your gums may bleed and may be sensitive to brushing and flossing.  Dark spots or blotches (chloasma, mask of pregnancy) may develop on your face. This will likely fade after the baby is born.  A dark line from your belly button to the pubic area (linea nigra) may appear. This will likely fade  after the baby is born.  You may have changes in your hair. These can include thickening of your hair, rapid growth, and changes in texture. Some women also have hair loss during or after pregnancy, or hair that feels dry or thin. Your hair will most likely return to normal after your baby is born. WHAT TO EXPECT AT YOUR PRENATAL VISITS During a routine prenatal visit:  You will be weighed to make sure you and the fetus are growing normally.  Your blood pressure will be taken.  Your abdomen will be measured to track your baby's growth.  The fetal heartbeat will be listened to.  Any test results from the previous visit will be discussed. Your health care provider may ask you:  How you are feeling.  If you are feeling the baby move.  If you have had any abnormal symptoms, such as leaking fluid, bleeding, severe headaches, or abdominal cramping.  If you have any questions. Other tests that may be performed during your second trimester include:  Blood tests that check for:  Low iron levels (anemia).  Gestational diabetes (between 24 and 28 weeks).  Rh antibodies.  Urine tests to check for infections, diabetes, or protein in the urine.  An ultrasound to confirm the proper growth and development of the baby.  An amniocentesis to check for possible genetic problems.  Fetal screens for spina bifida and Down syndrome. HOME CARE INSTRUCTIONS   Avoid all smoking, herbs, alcohol, and unprescribed   drugs. These chemicals affect the formation and growth of the baby.  Follow your health care provider's instructions regarding medicine use. There are medicines that are either safe or unsafe to take during pregnancy.  Exercise only as directed by your health care provider. Experiencing uterine cramps is a good sign to stop exercising.  Continue to eat regular, healthy meals.  Wear a good support bra for breast tenderness.  Do not use hot tubs, steam rooms, or saunas.  Wear your  seat belt at all times when driving.  Avoid raw meat, uncooked cheese, cat litter boxes, and soil used by cats. These carry germs that can cause birth defects in the baby.  Take your prenatal vitamins.  Try taking a stool softener (if your health care provider approves) if you develop constipation. Eat more high-fiber foods, such as fresh vegetables or fruit and whole grains. Drink plenty of fluids to keep your urine clear or pale yellow.  Take warm sitz baths to soothe any pain or discomfort caused by hemorrhoids. Use hemorrhoid cream if your health care provider approves.  If you develop varicose veins, wear support hose. Elevate your feet for 15 minutes, 3-4 times a day. Limit salt in your diet.  Avoid heavy lifting, wear low heel shoes, and practice good posture.  Rest with your legs elevated if you have leg cramps or low back pain.  Visit your dentist if you have not gone yet during your pregnancy. Use a soft toothbrush to brush your teeth and be gentle when you floss.  A sexual relationship may be continued unless your health care provider directs you otherwise.  Continue to go to all your prenatal visits as directed by your health care provider. SEEK MEDICAL CARE IF:   You have dizziness.  You have mild pelvic cramps, pelvic pressure, or nagging pain in the abdominal area.  You have persistent nausea, vomiting, or diarrhea.  You have a bad smelling vaginal discharge.  You have pain with urination. SEEK IMMEDIATE MEDICAL CARE IF:   You have a fever.  You are leaking fluid from your vagina.  You have spotting or bleeding from your vagina.  You have severe abdominal cramping or pain.  You have rapid weight gain or loss.  You have shortness of breath with chest pain.  You notice sudden or extreme swelling of your face, hands, ankles, feet, or legs.  You have not felt your baby move in over an hour.  You have severe headaches that do not go away with  medicine.  You have vision changes. Document Released: 04/17/2001 Document Revised: 04/28/2013 Document Reviewed: 06/24/2012 [employer] Patient Information 2015 [employer]. This information is not intended to replace advice given to you by your health care provider. Make sure you discuss any questions you have with your health care provider.  

## 2013-10-27 NOTE — Progress Notes (Signed)
Low-risk OB appointment G2P1001 [redacted]w[redacted]d Estimated Date of Delivery: 04/05/14 Blood pressure 106/60, weight 205 lb (92.987 kg), last menstrual period 06/29/2013.  BP, weight, and urine reviewed.  Refer to obstetrical flow sheet for FH & FHR.  Reports good fm.  Denies regular uc's, lof, vb, or uti s/s. Still vomiting 2-3x/d on diclegis and zofran, but able to keep food/liquids down and is gaining weight- states she stayed this way til 40wks w/ her daughter.  Went to [employer] last week, they say girl! Desires VBAC, consent signed Reviewed warning s/s to report. Plan:  Continue routine obstetrical care  F/U in 3wks for OB appointment and anatomy u/s 2nd IT today

## 2013-11-03 ENCOUNTER — Encounter: Payer: Self-pay | Admitting: Women's Health

## 2013-11-03 LAB — MATERNAL SCREEN, INTEGRATED #2
AFP MoM: 1.14
AFP, SERUM MAT SCREEN: 36 ng/mL
Age risk Down Syndrome: 1:780 {titer}
CALCULATED GESTATIONAL AGE MAT SCREEN: 17.3
Crown Rump Length: 73 mm
Estriol Mom: 0.87
Estriol, Free: 0.88 ng/mL
INHIBIN A DIMERIC MAT SCREEN: 74 pg/mL
INHIBIN A MOM MAT SCREEN: 0.52
NT MoM: 1.05
Nuchal Translucency: 1.69 mm
Number of fetuses: 1
PAPP-A MAT SCREEN: 666 ng/mL
PAPP-A MOM MAT SCREEN: 1.17
hCG MoM: 0.74
hCG, Serum: 16.9 IU/mL

## 2013-11-09 ENCOUNTER — Other Ambulatory Visit: Payer: Self-pay | Admitting: Women's Health

## 2013-11-10 MED ORDER — ONDANSETRON 4 MG PO TBDP: 4.0000 mg | ORAL_TABLET | Freq: Four times a day (QID) | ORAL | Status: DC | PRN

## 2013-11-17 ENCOUNTER — Encounter: Payer: Self-pay | Admitting: Women's Health

## 2013-11-17 ENCOUNTER — Encounter: Payer: BC Managed Care – PPO | Admitting: Women's Health

## 2013-11-17 ENCOUNTER — Ambulatory Visit (INDEPENDENT_AMBULATORY_CARE_PROVIDER_SITE_OTHER): Payer: BC Managed Care – PPO

## 2013-11-17 ENCOUNTER — Other Ambulatory Visit: Payer: Self-pay | Admitting: Women's Health

## 2013-11-17 ENCOUNTER — Ambulatory Visit (INDEPENDENT_AMBULATORY_CARE_PROVIDER_SITE_OTHER): Payer: BC Managed Care – PPO | Admitting: Women's Health

## 2013-11-17 ENCOUNTER — Other Ambulatory Visit: Payer: BC Managed Care – PPO

## 2013-11-17 VITALS — BP 102/60 | Wt 210.0 lb

## 2013-11-17 DIAGNOSIS — O34219 Maternal care for unspecified type scar from previous cesarean delivery: Secondary | ICD-10-CM

## 2013-11-17 DIAGNOSIS — Z1389 Encounter for screening for other disorder: Secondary | ICD-10-CM

## 2013-11-17 DIAGNOSIS — Z363 Encounter for antenatal screening for malformations: Secondary | ICD-10-CM

## 2013-11-17 DIAGNOSIS — Z3480 Encounter for supervision of other normal pregnancy, unspecified trimester: Secondary | ICD-10-CM

## 2013-11-17 DIAGNOSIS — Z331 Pregnant state, incidental: Secondary | ICD-10-CM

## 2013-11-17 DIAGNOSIS — Z3482 Encounter for supervision of other normal pregnancy, second trimester: Secondary | ICD-10-CM

## 2013-11-17 LAB — POCT URINALYSIS DIPSTICK
Glucose, UA: NEGATIVE
Ketones, UA: NEGATIVE
Leukocytes, UA: NEGATIVE
Nitrite, UA: NEGATIVE
PROTEIN UA: NEGATIVE
RBC UA: NEGATIVE

## 2013-11-17 NOTE — Progress Notes (Signed)
Low-risk OB appointment [id number] [redacted]w[redacted]d Estimated Date of Delivery: 04/05/14 BP 102/60  Wt 210 lb (95.255 kg)  LMP 06/29/2013  BP, weight, and urine reviewed.  Refer to obstetrical flow sheet for FH & FHR.  Reports good fm.  Denies regular uc's, lof, vb, or uti s/s. 1 BH/day x past week. Drinking lots of fluid. Cx long & closed on u/s today.  Reviewed today's anatomy u/s, ptl s/s, fm. Plan:  Continue routine obstetrical care  F/U in 4wks for OB appointment

## 2013-11-17 NOTE — Patient Instructions (Signed)
Second Trimester of Pregnancy The second trimester is from week 13 through week 28, months 4 through 6. The second trimester is often a time when you feel your best. Your body has also adjusted to being pregnant, and you begin to feel better physically. Usually, morning sickness has lessened or quit completely, you may have more energy, and you may have an increase in appetite. The second trimester is also a time when the fetus is growing rapidly. At the end of the sixth month, the fetus is about 9 inches long and weighs about 1 pounds. You will likely begin to feel the baby move (quickening) between 18 and 20 weeks of the pregnancy. BODY CHANGES Your body goes through many changes during pregnancy. The changes vary from woman to woman.   Your weight will continue to increase. You will notice your lower abdomen bulging out.  You may begin to get stretch marks on your hips, abdomen, and breasts.  You may develop headaches that can be relieved by medicines approved by your health care provider.  You may urinate more often because the fetus is pressing on your bladder.  You may develop or continue to have heartburn as a result of your pregnancy.  You may develop constipation because certain hormones are causing the muscles that push waste through your intestines to slow down.  You may develop hemorrhoids or swollen, bulging veins (varicose veins).  You may have back pain because of the weight gain and pregnancy hormones relaxing your joints between the bones in your pelvis and as a result of a shift in weight and the muscles that support your balance.  Your breasts will continue to grow and be tender.  Your gums may bleed and may be sensitive to brushing and flossing.  Dark spots or blotches (chloasma, mask of pregnancy) may develop on your face. This will likely fade after the baby is born.  A dark line from your belly button to the pubic area (linea nigra) may appear. This will likely fade  after the baby is born.  You may have changes in your hair. These can include thickening of your hair, rapid growth, and changes in texture. Some women also have hair loss during or after pregnancy, or hair that feels dry or thin. Your hair will most likely return to normal after your baby is born. WHAT TO EXPECT AT YOUR PRENATAL VISITS During a routine prenatal visit:  You will be weighed to make sure you and the fetus are growing normally.  Your blood pressure will be taken.  Your abdomen will be measured to track your baby's growth.  The fetal heartbeat will be listened to.  Any test results from the previous visit will be discussed. Your health care provider may ask you:  How you are feeling.  If you are feeling the baby move.  If you have had any abnormal symptoms, such as leaking fluid, bleeding, severe headaches, or abdominal cramping.  If you have any questions. Other tests that may be performed during your second trimester include:  Blood tests that check for:  Low iron levels (anemia).  Gestational diabetes (between 24 and 28 weeks).  Rh antibodies.  Urine tests to check for infections, diabetes, or protein in the urine.  An ultrasound to confirm the proper growth and development of the baby.  An amniocentesis to check for possible genetic problems.  Fetal screens for spina bifida and Down syndrome. HOME CARE INSTRUCTIONS   Avoid all smoking, herbs, alcohol, and unprescribed   drugs. These chemicals affect the formation and growth of the baby.  Follow your health care provider's instructions regarding medicine use. There are medicines that are either safe or unsafe to take during pregnancy.  Exercise only as directed by your health care provider. Experiencing uterine cramps is a good sign to stop exercising.  Continue to eat regular, healthy meals.  Wear a good support bra for breast tenderness.  Do not use hot tubs, steam rooms, or saunas.  Wear your  seat belt at all times when driving.  Avoid raw meat, uncooked cheese, cat litter boxes, and soil used by cats. These carry germs that can cause birth defects in the baby.  Take your prenatal vitamins.  Try taking a stool softener (if your health care provider approves) if you develop constipation. Eat more high-fiber foods, such as fresh vegetables or fruit and whole grains. Drink plenty of fluids to keep your urine clear or pale yellow.  Take warm sitz baths to soothe any pain or discomfort caused by hemorrhoids. Use hemorrhoid cream if your health care provider approves.  If you develop varicose veins, wear support hose. Elevate your feet for 15 minutes, 3-4 times a day. Limit salt in your diet.  Avoid heavy lifting, wear low heel shoes, and practice good posture.  Rest with your legs elevated if you have leg cramps or low back pain.  Visit your dentist if you have not gone yet during your pregnancy. Use a soft toothbrush to brush your teeth and be gentle when you floss.  A sexual relationship may be continued unless your health care provider directs you otherwise.  Continue to go to all your prenatal visits as directed by your health care provider. SEEK MEDICAL CARE IF:   You have dizziness.  You have mild pelvic cramps, pelvic pressure, or nagging pain in the abdominal area.  You have persistent nausea, vomiting, or diarrhea.  You have a bad smelling vaginal discharge.  You have pain with urination. SEEK IMMEDIATE MEDICAL CARE IF:   You have a fever.  You are leaking fluid from your vagina.  You have spotting or bleeding from your vagina.  You have severe abdominal cramping or pain.  You have rapid weight gain or loss.  You have shortness of breath with chest pain.  You notice sudden or extreme swelling of your face, hands, ankles, feet, or legs.  You have not felt your baby move in over an hour.  You have severe headaches that do not go away with  medicine.  You have vision changes. Document Released: 04/17/2001 Document Revised: 04/28/2013 Document Reviewed: 06/24/2012 [employer] Patient Information 2015 [employer]. This information is not intended to replace advice given to you by your health care provider. Make sure you discuss any questions you have with your health care provider.  

## 2013-11-17 NOTE — Progress Notes (Signed)
U/S(20+1wks)-active fetus, meas c/w dates, fluid wnl, posterior Gr 0 placenta, cx appears closed (3.6cm), bilateral adnexa appears WNL, FHR-154 bpm, female fetus, no major abnl noted

## 2013-12-15 ENCOUNTER — Encounter: Payer: Self-pay | Admitting: Obstetrics & Gynecology

## 2013-12-15 ENCOUNTER — Ambulatory Visit (INDEPENDENT_AMBULATORY_CARE_PROVIDER_SITE_OTHER): Payer: Medicaid Other | Admitting: Obstetrics & Gynecology

## 2013-12-15 VITALS — BP 104/64 | Wt 214.0 lb

## 2013-12-15 DIAGNOSIS — Z3480 Encounter for supervision of other normal pregnancy, unspecified trimester: Secondary | ICD-10-CM

## 2013-12-15 DIAGNOSIS — Z331 Pregnant state, incidental: Secondary | ICD-10-CM

## 2013-12-15 DIAGNOSIS — Z1389 Encounter for screening for other disorder: Secondary | ICD-10-CM

## 2013-12-15 LAB — POCT URINALYSIS DIPSTICK
Blood, UA: NEGATIVE
GLUCOSE UA: NEGATIVE
Ketones, UA: NEGATIVE
Leukocytes, UA: NEGATIVE
Nitrite, UA: NEGATIVE
Protein, UA: NEGATIVE

## 2013-12-15 NOTE — Progress Notes (Signed)
Pt states that she has been feeling weak for the past few days.

## 2013-12-15 NOTE — Progress Notes (Signed)
[id number] [redacted]w[redacted]d Estimated Date of Delivery: 04/05/14  Blood pressure 104/64, weight 214 lb (97.07 kg), last menstrual period 06/29/2013.   BP weight and urine results all reviewed and noted.  Please refer to the obstetrical flow sheet for the fundal height and fetal heart rate documentation:  Patient reports good fetal movement, denies any bleeding and no rupture of membranes symptoms or regular contractions. Patient is without complaints. All questions were answered.  Plan:  Continued routine obstetrical care,   Follow up in 4 weeks for OB appointment, routine+ PN2

## 2013-12-16 ENCOUNTER — Encounter: Payer: BC Managed Care – PPO | Admitting: Advanced Practice Midwife

## 2014-01-12 ENCOUNTER — Encounter: Payer: Self-pay | Admitting: Obstetrics & Gynecology

## 2014-01-12 ENCOUNTER — Other Ambulatory Visit: Payer: BC Managed Care – PPO

## 2014-01-12 ENCOUNTER — Ambulatory Visit (INDEPENDENT_AMBULATORY_CARE_PROVIDER_SITE_OTHER): Payer: BC Managed Care – PPO | Admitting: Obstetrics & Gynecology

## 2014-01-12 VITALS — BP 90/60 | Wt 216.0 lb

## 2014-01-12 DIAGNOSIS — Z3483 Encounter for supervision of other normal pregnancy, third trimester: Secondary | ICD-10-CM

## 2014-01-12 DIAGNOSIS — Z331 Pregnant state, incidental: Secondary | ICD-10-CM

## 2014-01-12 DIAGNOSIS — Z131 Encounter for screening for diabetes mellitus: Secondary | ICD-10-CM

## 2014-01-12 DIAGNOSIS — Z1159 Encounter for screening for other viral diseases: Secondary | ICD-10-CM

## 2014-01-12 DIAGNOSIS — Z3480 Encounter for supervision of other normal pregnancy, unspecified trimester: Secondary | ICD-10-CM

## 2014-01-12 DIAGNOSIS — Z0184 Encounter for antibody response examination: Secondary | ICD-10-CM

## 2014-01-12 LAB — CBC
HCT: 33.5 % — ABNORMAL LOW (ref 36.0–46.0)
HEMOGLOBIN: 11.8 g/dL — AB (ref 12.0–15.0)
MCH: 31.4 pg (ref 26.0–34.0)
MCHC: 35.2 g/dL (ref 30.0–36.0)
MCV: 89.1 fL (ref 78.0–100.0)
Platelets: 219 10*3/uL (ref 150–400)
RBC: 3.76 MIL/uL — ABNORMAL LOW (ref 3.87–5.11)
RDW: 12.6 % (ref 11.5–15.5)
WBC: 6.6 10*3/uL (ref 4.0–10.5)

## 2014-01-12 NOTE — Progress Notes (Signed)
G2P1001 [redacted]w[redacted]d Estimated Date of Delivery: 04/05/14  Blood pressure 90/60, weight 216 lb (97.977 kg), last menstrual period 06/29/2013.   BP weight and urine results all reviewed and noted.  Please refer to the obstetrical flow sheet for the fundal height and fetal heart rate documentation:  Patient reports good fetal movement, denies any bleeding and no rupture of membranes symptoms or regular contractions. Patient is without complaints. All questions were answered.  Plan:  Continued routine obstetrical care,   Follow up in 3 weeks for OB appointment, ob visit

## 2014-01-13 LAB — ANTIBODY SCREEN: ANTIBODY SCREEN: NEGATIVE

## 2014-01-13 LAB — GLUCOSE TOLERANCE, 2 HOURS W/ 1HR
GLUCOSE, 2 HOUR: 99 mg/dL (ref 70–139)
GLUCOSE: 113 mg/dL (ref 70–170)
Glucose, Fasting: 76 mg/dL (ref 70–99)

## 2014-01-13 LAB — HIV ANTIBODY (ROUTINE TESTING W REFLEX): HIV 1&2 Ab, 4th Generation: NONREACTIVE

## 2014-01-13 LAB — HSV 2 ANTIBODY, IGG: HSV 2 Glycoprotein G Ab, IgG: <0.1 IV

## 2014-01-13 LAB — RPR

## 2014-02-02 ENCOUNTER — Ambulatory Visit (INDEPENDENT_AMBULATORY_CARE_PROVIDER_SITE_OTHER): Payer: BC Managed Care – PPO | Admitting: Women's Health

## 2014-02-02 ENCOUNTER — Encounter: Payer: Self-pay | Admitting: Women's Health

## 2014-02-02 VITALS — BP 112/60 | Wt 225.0 lb

## 2014-02-02 DIAGNOSIS — Z331 Pregnant state, incidental: Secondary | ICD-10-CM

## 2014-02-02 DIAGNOSIS — Z1389 Encounter for screening for other disorder: Secondary | ICD-10-CM

## 2014-02-02 DIAGNOSIS — Z3483 Encounter for supervision of other normal pregnancy, third trimester: Secondary | ICD-10-CM

## 2014-02-02 DIAGNOSIS — Z3480 Encounter for supervision of other normal pregnancy, unspecified trimester: Secondary | ICD-10-CM

## 2014-02-02 LAB — POCT URINALYSIS DIPSTICK
Blood, UA: NEGATIVE
Glucose, UA: NEGATIVE
Ketones, UA: NEGATIVE
LEUKOCYTES UA: NEGATIVE
NITRITE UA: NEGATIVE
PROTEIN UA: NEGATIVE

## 2014-02-02 MED ORDER — BUTALBITAL-APAP-CAFFEINE 50-325-40 MG PO TABS: ORAL_TABLET | ORAL | Status: DC

## 2014-02-02 NOTE — Progress Notes (Signed)
Low-risk OB appointment [id number] [redacted]w[redacted]d Estimated Date of Delivery: 04/05/14 BP 112/60  Wt 225 lb (102.059 kg)  LMP 06/29/2013  BP, weight, and urine reviewed.  Refer to obstetrical flow sheet for FH & FHR.  Reports good fm.  Denies regular uc's, lof, vb, or uti s/s. Some pelvic pressure- recommended pregnancy belt.  Requests refill on fioricet, takes maybe 2x/wk. Requests maternity leave note for work to begin @ 39wks, doesn't have FMLA, will have to go back at 4wks pp d/t school calendar.  Reviewed pn2 results, ptl s/s, fkc. Plan:  Continue routine obstetrical care  F/U in 2wks for OB appointment

## 2014-02-02 NOTE — Patient Instructions (Signed)
Tdap Vaccine  It is recommended that you get the Tdap vaccine during the third trimester of EACH pregnancy to help protect your baby from getting pertussis (whooping cough)  27-36 weeks is the BEST time to do this so that you can pass the protection on to your baby. During pregnancy is better than after pregnancy, but if you are unable to get it during pregnancy it will be offered at the hospital.   You can get this vaccine at the health department or your family doctor  Everyone who will be around your baby should also be up-to-date on their vaccines. Adults (who are not pregnant) only need 1 dose of Tdap during adulthood.    Call the office [phone]) or go to [facility] if:  You begin to have strong, frequent contractions  Your water breaks.  Sometimes it is a big gush of fluid, sometimes it is just a trickle that keeps getting your panties wet or running down your legs  You have vaginal bleeding.  It is normal to have a small amount of spotting if your cervix was checked.   You don't feel your baby moving like normal.  If you don't, get you something to eat and drink and lay down and focus on feeling your baby move.  You should feel at least 10 movements in 2 hours.  If you don't, you should call the office or go to [facility] The third trimester is from week 29 through week 42, months 7 through 9. The third trimester is a time when the fetus is growing rapidly. At the end of the ninth month, the fetus is about 20 inches in length and weighs 6-10 pounds.  BODY CHANGES Your body goes through many changes during pregnancy. The changes vary from woman to woman.   Your weight will continue to increase. You can expect to gain 25-35 pounds (11-16 kg) by the end of the pregnancy.  You may begin to get stretch marks on your hips, abdomen, and breasts.  You may urinate more often because the fetus is moving lower into your pelvis and pressing on  your bladder.  You may develop or continue to have heartburn as a result of your pregnancy.  You may develop constipation because certain hormones are causing the muscles that push waste through your intestines to slow down.  You may develop hemorrhoids or swollen, bulging veins (varicose veins).  You may have pelvic pain because of the weight gain and pregnancy hormones relaxing your joints between the bones in your pelvis. Backaches may result from overexertion of the muscles supporting your posture.  You may have changes in your hair. These can include thickening of your hair, rapid growth, and changes in texture. Some women also have hair loss during or after pregnancy, or hair that feels dry or thin. Your hair will most likely return to normal after your baby is born.  Your breasts will continue to grow and be tender. A yellow discharge may leak from your breasts called colostrum.  Your belly button may stick out.  You may feel short of breath because of your expanding uterus.  You may notice the fetus "dropping," or moving lower in your abdomen.  You may have a bloody mucus discharge. This usually occurs a few days to a week before labor begins.  Your cervix becomes thin and soft (effaced) near your due date. WHAT TO EXPECT AT YOUR PRENATAL EXAMS  You will have  prenatal exams every 2 weeks until week 36. Then, you will have weekly prenatal exams. During a routine prenatal visit:  You will be weighed to make sure you and the fetus are growing normally.  Your blood pressure is taken.  Your abdomen will be measured to track your baby's growth.  The fetal heartbeat will be listened to.  Any test results from the previous visit will be discussed.  You may have a cervical check near your due date to see if you have effaced. At around 36 weeks, your caregiver will check your cervix. At the same time, your caregiver will also perform a test on the secretions of the vaginal tissue.  This test is to determine if a type of bacteria, Group B streptococcus, is present. Your caregiver will explain this further. Your caregiver may ask you:  What your birth plan is.  How you are feeling.  If you are feeling the baby move.  If you have had any abnormal symptoms, such as leaking fluid, bleeding, severe headaches, or abdominal cramping.  If you have any questions. Other tests or screenings that may be performed during your third trimester include:  Blood tests that check for low iron levels (anemia).  Fetal testing to check the health, activity level, and growth of the fetus. Testing is done if you have certain medical conditions or if there are problems during the pregnancy. FALSE LABOR You may feel small, irregular contractions that eventually go away. These are called Braxton Hicks contractions, or false labor. Contractions may last for hours, days, or even weeks before true labor sets in. If contractions come at regular intervals, intensify, or become painful, it is best to be seen by your caregiver.  SIGNS OF LABOR   Menstrual-like cramps.  Contractions that are 5 minutes apart or less.  Contractions that start on the top of the uterus and spread down to the lower abdomen and back.  A sense of increased pelvic pressure or back pain.  A watery or bloody mucus discharge that comes from the vagina. If you have any of these signs before the 37th week of pregnancy, call your caregiver right away. You need to go to the hospital to get checked immediately. HOME CARE INSTRUCTIONS   Avoid all smoking, herbs, alcohol, and unprescribed drugs. These chemicals affect the formation and growth of the baby.  Follow your caregiver's instructions regarding medicine use. There are medicines that are either safe or unsafe to take during pregnancy.  Exercise only as directed by your caregiver. Experiencing uterine cramps is a good sign to stop exercising.  Continue to eat regular,  healthy meals.  Wear a good support bra for breast tenderness.  Do not use hot tubs, steam rooms, or saunas.  Wear your seat belt at all times when driving.  Avoid raw meat, uncooked cheese, cat litter boxes, and soil used by cats. These carry germs that can cause birth defects in the baby.  Take your prenatal vitamins.  Try taking a stool softener (if your caregiver approves) if you develop constipation. Eat more high-fiber foods, such as fresh vegetables or fruit and whole grains. Drink plenty of fluids to keep your urine clear or pale yellow.  Take warm sitz baths to soothe any pain or discomfort caused by hemorrhoids. Use hemorrhoid cream if your caregiver approves.  If you develop varicose veins, wear support hose. Elevate your feet for 15 minutes, 3-4 times a day. Limit salt in your diet.  Avoid heavy lifting, wear low  heal shoes, and practice good posture.  Rest a lot with your legs elevated if you have leg cramps or low back pain.  Visit your dentist if you have not gone during your pregnancy. Use a soft toothbrush to brush your teeth and be gentle when you floss.  A sexual relationship may be continued unless your caregiver directs you otherwise.  Do not travel far distances unless it is absolutely necessary and only with the approval of your caregiver.  Take prenatal classes to understand, practice, and ask questions about the labor and delivery.  Make a trial run to the hospital.  Pack your hospital bag.  Prepare the baby's nursery.  Continue to go to all your prenatal visits as directed by your caregiver. SEEK MEDICAL CARE IF:  You are unsure if you are in labor or if your water has broken.  You have dizziness.  You have mild pelvic cramps, pelvic pressure, or nagging pain in your abdominal area.  You have persistent nausea, vomiting, or diarrhea.  You have a bad smelling vaginal discharge.  You have pain with urination. SEEK IMMEDIATE MEDICAL CARE IF:    You have a fever.  You are leaking fluid from your vagina.  You have spotting or bleeding from your vagina.  You have severe abdominal cramping or pain.  You have rapid weight loss or gain.  You have shortness of breath with chest pain.  You notice sudden or extreme swelling of your face, hands, ankles, feet, or legs.  You have not felt your baby move in over an hour.  You have severe headaches that do not go away with medicine.  You have vision changes. Document Released: 04/17/2001 Document Revised: 04/28/2013 Document Reviewed: 06/24/2012 [facility] Patient Information 2015 [place], [place]. This information is not intended to replace advice given to you by your health care provider. Make sure you discuss any questions you have with your health care provider.

## 2014-02-16 ENCOUNTER — Encounter: Payer: Self-pay | Admitting: Women's Health

## 2014-02-16 ENCOUNTER — Ambulatory Visit (INDEPENDENT_AMBULATORY_CARE_PROVIDER_SITE_OTHER): Payer: BC Managed Care – PPO | Admitting: Women's Health

## 2014-02-16 VITALS — BP 110/62 | Wt 226.0 lb

## 2014-02-16 DIAGNOSIS — N949 Unspecified condition associated with female genital organs and menstrual cycle: Secondary | ICD-10-CM

## 2014-02-16 DIAGNOSIS — N898 Other specified noninflammatory disorders of vagina: Secondary | ICD-10-CM

## 2014-02-16 DIAGNOSIS — R102 Pelvic and perineal pain: Secondary | ICD-10-CM

## 2014-02-16 DIAGNOSIS — Z1389 Encounter for screening for other disorder: Secondary | ICD-10-CM

## 2014-02-16 DIAGNOSIS — O26893 Other specified pregnancy related conditions, third trimester: Secondary | ICD-10-CM

## 2014-02-16 DIAGNOSIS — Z9889 Other specified postprocedural states: Secondary | ICD-10-CM

## 2014-02-16 DIAGNOSIS — Z3483 Encounter for supervision of other normal pregnancy, third trimester: Secondary | ICD-10-CM

## 2014-02-16 DIAGNOSIS — Z98891 History of uterine scar from previous surgery: Secondary | ICD-10-CM

## 2014-02-16 DIAGNOSIS — Z331 Pregnant state, incidental: Secondary | ICD-10-CM

## 2014-02-16 LAB — POCT WET PREP (WET MOUNT): CLUE CELLS WET PREP WHIFF POC: POSITIVE

## 2014-02-16 LAB — POCT URINALYSIS DIPSTICK
Glucose, UA: NEGATIVE
Ketones, UA: NEGATIVE
Leukocytes, UA: NEGATIVE
Nitrite, UA: NEGATIVE
PROTEIN UA: NEGATIVE
RBC UA: NEGATIVE

## 2014-02-16 MED ORDER — METRONIDAZOLE 500 MG PO TABS: 500.0000 mg | ORAL_TABLET | Freq: Two times a day (BID) | ORAL | Status: DC

## 2014-02-16 NOTE — Patient Instructions (Signed)
Call the office [phone]) or go to [facility] if:  You begin to have strong, frequent contractions  Your water breaks.  Sometimes it is a big gush of fluid, sometimes it is just a trickle that keeps getting your panties wet or running down your legs  You have vaginal bleeding.  It is normal to have a small amount of spotting if your cervix was checked.   You don't feel your baby moving like normal.  If you don't, get you something to eat and drink and lay down and focus on feeling your baby move.  You should feel at least 10 movements in 2 hours.  If you don't, you should call the office or go to [facility] The third trimester is from week 29 through week 42, months 7 through 9. The third trimester is a time when the fetus is growing rapidly. At the end of the ninth month, the fetus is about 20 inches in length and weighs 6-10 pounds.  BODY CHANGES Your body goes through many changes during pregnancy. The changes vary from woman to woman.   Your weight will continue to increase. You can expect to gain 25-35 pounds (11-16 kg) by the end of the pregnancy.  You may begin to get stretch marks on your hips, abdomen, and breasts.  You may urinate more often because the fetus is moving lower into your pelvis and pressing on your bladder.  You may develop or continue to have heartburn as a result of your pregnancy.  You may develop constipation because certain hormones are causing the muscles that push waste through your intestines to slow down.  You may develop hemorrhoids or swollen, bulging veins (varicose veins).  You may have pelvic pain because of the weight gain and pregnancy hormones relaxing your joints between the bones in your pelvis. Backaches may result from overexertion of the muscles supporting your posture.  You may have changes in your hair. These can include thickening of your hair, rapid growth, and changes in texture. Some women  also have hair loss during or after pregnancy, or hair that feels dry or thin. Your hair will most likely return to normal after your baby is born.  Your breasts will continue to grow and be tender. A yellow discharge may leak from your breasts called colostrum.  Your belly button may stick out.  You may feel short of breath because of your expanding uterus.  You may notice the fetus "dropping," or moving lower in your abdomen.  You may have a bloody mucus discharge. This usually occurs a few days to a week before labor begins.  Your cervix becomes thin and soft (effaced) near your due date. WHAT TO EXPECT AT YOUR PRENATAL EXAMS  You will have prenatal exams every 2 weeks until week 36. Then, you will have weekly prenatal exams. During a routine prenatal visit:  You will be weighed to make sure you and the fetus are growing normally.  Your blood pressure is taken.  Your abdomen will be measured to track your baby's growth.  The fetal heartbeat will be listened to.  Any test results from the previous visit will be discussed.  You may have a cervical check near your due date to see if you have effaced. At around 36 weeks, your caregiver will check your cervix. At the same time, your caregiver will also perform a test on the secretions of the vaginal tissue. This test is to  determine if a type of bacteria, Group B streptococcus, is present. Your caregiver will explain this further. Your caregiver may ask you:  What your birth plan is.  How you are feeling.  If you are feeling the baby move.  If you have had any abnormal symptoms, such as leaking fluid, bleeding, severe headaches, or abdominal cramping.  If you have any questions. Other tests or screenings that may be performed during your third trimester include:  Blood tests that check for low iron levels (anemia).  Fetal testing to check the health, activity level, and growth of the fetus. Testing is done if you have certain  medical conditions or if there are problems during the pregnancy. FALSE LABOR You may feel small, irregular contractions that eventually go away. These are called Braxton Hicks contractions, or false labor. Contractions may last for hours, days, or even weeks before true labor sets in. If contractions come at regular intervals, intensify, or become painful, it is best to be seen by your caregiver.  SIGNS OF LABOR   Menstrual-like cramps.  Contractions that are 5 minutes apart or less.  Contractions that start on the top of the uterus and spread down to the lower abdomen and back.  A sense of increased pelvic pressure or back pain.  A watery or bloody mucus discharge that comes from the vagina. If you have any of these signs before the 37th week of pregnancy, call your caregiver right away. You need to go to the hospital to get checked immediately. HOME CARE INSTRUCTIONS   Avoid all smoking, herbs, alcohol, and unprescribed drugs. These chemicals affect the formation and growth of the baby.  Follow your caregiver's instructions regarding medicine use. There are medicines that are either safe or unsafe to take during pregnancy.  Exercise only as directed by your caregiver. Experiencing uterine cramps is a good sign to stop exercising.  Continue to eat regular, healthy meals.  Wear a good support bra for breast tenderness.  Do not use hot tubs, steam rooms, or saunas.  Wear your seat belt at all times when driving.  Avoid raw meat, uncooked cheese, cat litter boxes, and soil used by cats. These carry germs that can cause birth defects in the baby.  Take your prenatal vitamins.  Try taking a stool softener (if your caregiver approves) if you develop constipation. Eat more high-fiber foods, such as fresh vegetables or fruit and whole grains. Drink plenty of fluids to keep your urine clear or pale yellow.  Take warm sitz baths to soothe any pain or discomfort caused by hemorrhoids. Use  hemorrhoid cream if your caregiver approves.  If you develop varicose veins, wear support hose. Elevate your feet for 15 minutes, 3-4 times a day. Limit salt in your diet.  Avoid heavy lifting, wear low heal shoes, and practice good posture.  Rest a lot with your legs elevated if you have leg cramps or low back pain.  Visit your dentist if you have not gone during your pregnancy. Use a soft toothbrush to brush your teeth and be gentle when you floss.  A sexual relationship may be continued unless your caregiver directs you otherwise.  Do not travel far distances unless it is absolutely necessary and only with the approval of your caregiver.  Take prenatal classes to understand, practice, and ask questions about the labor and delivery.  Make a trial run to the hospital.  Pack your hospital bag.  Prepare the baby's nursery.  Continue to go to all  your prenatal visits as directed by your caregiver. SEEK MEDICAL CARE IF:  You are unsure if you are in labor or if your water has broken.  You have dizziness.  You have mild pelvic cramps, pelvic pressure, or nagging pain in your abdominal area.  You have persistent nausea, vomiting, or diarrhea.  You have a bad smelling vaginal discharge.  You have pain with urination. SEEK IMMEDIATE MEDICAL CARE IF:   You have a fever.  You are leaking fluid from your vagina.  You have spotting or bleeding from your vagina.  You have severe abdominal cramping or pain.  You have rapid weight loss or gain.  You have shortness of breath with chest pain.  You notice sudden or extreme swelling of your face, hands, ankles, feet, or legs.  You have not felt your baby move in over an hour.  You have severe headaches that do not go away with medicine.  You have vision changes. Document Released: 04/17/2001 Document Revised: 04/28/2013 Document Reviewed: 06/24/2012 [employer] Patient Information 2015 [employer]. This information is not  intended to replace advice given to you by your health care provider. Make sure you discuss any questions you have with your health care provider.  

## 2014-02-16 NOTE — Progress Notes (Signed)
Low-risk OB appointment G2P1001 [redacted]w[redacted]d Estimated Date of Delivery: 04/05/14 BP 110/62  Wt 226 lb (102.513 kg)  LMP 06/29/2013  BP, weight, and urine reviewed.  Refer to obstetrical flow sheet for FH & FHR.  Reports good fm.  Denies regular uc's, lof, vb, or uti s/s. Lots of pressure, cramping. Has decided she wants RLTCS.  Spec exam: cx visually closed, small amount thin white slightly malodorous d/c SVE: LTC Wet prep: few clues, otherwise normal, rx flagyl for bv Reviewed ptl s/s, fkc Plan:  Continue routine obstetrical care  F/U in 2wks for OB appointment w/ MD to schedule RLTCS Got flu shot & tdap at [facility]

## 2014-03-01 ENCOUNTER — Ambulatory Visit (INDEPENDENT_AMBULATORY_CARE_PROVIDER_SITE_OTHER): Payer: BC Managed Care – PPO | Admitting: Obstetrics & Gynecology

## 2014-03-01 VITALS — BP 140/80 | Wt 228.0 lb

## 2014-03-01 DIAGNOSIS — Z1389 Encounter for screening for other disorder: Secondary | ICD-10-CM

## 2014-03-01 DIAGNOSIS — Z331 Pregnant state, incidental: Secondary | ICD-10-CM

## 2014-03-01 DIAGNOSIS — Z3483 Encounter for supervision of other normal pregnancy, third trimester: Secondary | ICD-10-CM

## 2014-03-01 LAB — POCT URINALYSIS DIPSTICK
Blood, UA: NEGATIVE
Glucose, UA: NEGATIVE
KETONES UA: NEGATIVE
LEUKOCYTES UA: NEGATIVE
Nitrite, UA: NEGATIVE
Protein, UA: NEGATIVE

## 2014-03-01 NOTE — Progress Notes (Signed)
G2P1001 [redacted]w[redacted]d Estimated Date of Delivery: 04/05/14  Blood pressure 140/80, weight 228 lb (103.42 kg), last menstrual period 06/29/2013.   BP weight and urine results all reviewed and noted.  Please refer to the obstetrical flow sheet for the fundal height and fetal heart rate documentation:  Patient reports good fetal movement, denies any bleeding and no rupture of membranes symptoms or regular contractions. Patient is without complaints. All questions were answered.  Plan:  Continued routine obstetrical care,   Follow up in 2 weeks for OB appointment, routine

## 2014-03-08 ENCOUNTER — Encounter: Payer: Self-pay | Admitting: Women's Health

## 2014-03-16 ENCOUNTER — Encounter: Payer: Self-pay | Admitting: Obstetrics & Gynecology

## 2014-03-16 ENCOUNTER — Ambulatory Visit (INDEPENDENT_AMBULATORY_CARE_PROVIDER_SITE_OTHER): Payer: BC Managed Care – PPO | Admitting: Obstetrics & Gynecology

## 2014-03-16 VITALS — BP 110/70 | Wt 231.0 lb

## 2014-03-16 DIAGNOSIS — Z113 Encounter for screening for infections with a predominantly sexual mode of transmission: Secondary | ICD-10-CM

## 2014-03-16 DIAGNOSIS — Z1389 Encounter for screening for other disorder: Secondary | ICD-10-CM

## 2014-03-16 DIAGNOSIS — Z3685 Encounter for antenatal screening for Streptococcus B: Secondary | ICD-10-CM

## 2014-03-16 DIAGNOSIS — Z331 Pregnant state, incidental: Secondary | ICD-10-CM

## 2014-03-16 DIAGNOSIS — Z3483 Encounter for supervision of other normal pregnancy, third trimester: Secondary | ICD-10-CM

## 2014-03-16 LAB — POCT URINALYSIS DIPSTICK
Glucose, UA: NEGATIVE
KETONES UA: NEGATIVE
Leukocytes, UA: NEGATIVE
NITRITE UA: NEGATIVE
Protein, UA: NEGATIVE
RBC UA: NEGATIVE

## 2014-03-16 NOTE — Progress Notes (Signed)
G2P1001 [redacted]w[redacted]d Estimated Date of Delivery: 04/05/14  Blood pressure 110/70, weight 231 lb (104.781 kg), last menstrual period 06/29/2013.   BP weight and urine results all reviewed and noted.  Please refer to the obstetrical flow sheet for the fundal height and fetal heart rate documentation:  Patient reports good fetal movement, denies any bleeding and no rupture of membranes symptoms or regular contractions. Patient is without complaints. All questions were answered.  Plan:  Continued routine obstetrical care,   Follow up in 1 weeks for OB appointment, routine

## 2014-03-16 NOTE — Addendum Note (Signed)
Addended by: Doyne Keel on: 03/16/2014 05:01 PM   Modules accepted: Orders

## 2014-03-17 LAB — GC/CHLAMYDIA PROBE AMP
CT Probe RNA: NEGATIVE
GC Probe RNA: NEGATIVE

## 2014-03-17 LAB — STREP B DNA PROBE: GBSP: NOT DETECTED

## 2014-03-23 ENCOUNTER — Ambulatory Visit (INDEPENDENT_AMBULATORY_CARE_PROVIDER_SITE_OTHER): Payer: BC Managed Care – PPO | Admitting: Obstetrics & Gynecology

## 2014-03-23 ENCOUNTER — Encounter: Payer: Self-pay | Admitting: Obstetrics & Gynecology

## 2014-03-23 VITALS — BP 100/60 | Wt 233.0 lb

## 2014-03-23 DIAGNOSIS — Z331 Pregnant state, incidental: Secondary | ICD-10-CM

## 2014-03-23 DIAGNOSIS — Z3483 Encounter for supervision of other normal pregnancy, third trimester: Secondary | ICD-10-CM

## 2014-03-23 DIAGNOSIS — Z1389 Encounter for screening for other disorder: Secondary | ICD-10-CM

## 2014-03-23 DIAGNOSIS — Z98891 History of uterine scar from previous surgery: Secondary | ICD-10-CM

## 2014-03-23 LAB — POCT URINALYSIS DIPSTICK
Blood, UA: NEGATIVE
GLUCOSE UA: NEGATIVE
Ketones, UA: NEGATIVE
LEUKOCYTES UA: NEGATIVE
NITRITE UA: NEGATIVE
Protein, UA: NEGATIVE

## 2014-03-23 NOTE — Progress Notes (Signed)
G2P1001 [redacted]w[redacted]d Estimated Date of Delivery: 04/05/14  Blood pressure 100/60, weight 233 lb (105.688 kg), last menstrual period 06/29/2013.   BP weight and urine results all reviewed and noted.  Please refer to the obstetrical flow sheet for the fundal height and fetal heart rate documentation:  Patient reports good fetal movement, denies any bleeding and no rupture of membranes symptoms or regular contractions. Patient is without complaints. All questions were answered.  Plan:  Continued routine obstetrical care,   Follow up in 2 weeks for OB appointment, post op

## 2014-03-24 NOTE — Patient Instructions (Addendum)
   Your procedure is scheduled on:  Monday, Nov 23   Enter through the [employer] [facility] at: [place] up the phone at the desk and dial [phone] and inform us of your arrival.  Please call this number if you have any problems the morning of surgery: [phone]  Remember: Do not eat food after midnight: Sunday Do not drink clear liquids after: 9:30 AM Monday, day of surgery Take these medicines the morning of surgery with a SIP OF WATER:  None  Do not wear jewelry, make-up, or FINGER nail polish No metal in your hair or on your body. Do not wear lotions, powders, perfumes.  You may wear deodorant.  Do not bring valuables to the hospital. Contacts, dentures or bridgework may not be worn into surgery.  Leave suitcase in the car. After Surgery it may be brought to your room. For patients being admitted to the hospital, checkout time is 11:00am the day of discharge.  [place]  CELL [phone].

## 2014-03-25 ENCOUNTER — Other Ambulatory Visit: Payer: Self-pay | Admitting: Obstetrics & Gynecology

## 2014-03-26 ENCOUNTER — Encounter (HOSPITAL_COMMUNITY): Payer: Self-pay

## 2014-03-26 ENCOUNTER — Encounter (HOSPITAL_COMMUNITY)
Admission: RE | Admit: 2014-03-26 | Discharge: 2014-03-26 | Disposition: A | Discharge status: Home / Self Care | Payer: BC Managed Care – PPO | Source: Ambulatory Visit | Attending: Obstetrics & Gynecology | Admitting: Obstetrics & Gynecology

## 2014-03-26 VITALS — BP 123/84 | HR 99 | Resp 20 | Ht 66.0 in | Wt 233.0 lb

## 2014-03-26 DIAGNOSIS — Z98891 History of uterine scar from previous surgery: Secondary | ICD-10-CM

## 2014-03-26 DIAGNOSIS — O209 Hemorrhage in early pregnancy, unspecified: Secondary | ICD-10-CM

## 2014-03-26 HISTORY — DX: Calculus of kidney: N20.0

## 2014-03-26 HISTORY — DX: Nausea with vomiting, unspecified: R11.2

## 2014-03-26 HISTORY — DX: Other specified postprocedural states: Z98.890

## 2014-03-26 LAB — CBC
HCT: 32.6 % — ABNORMAL LOW (ref 36.0–46.0)
HEMOGLOBIN: 10.9 g/dL — AB (ref 12.0–15.0)
MCH: 29.5 pg (ref 26.0–34.0)
MCHC: 33.4 g/dL (ref 30.0–36.0)
MCV: 88.3 fL (ref 78.0–100.0)
Platelets: 162 10*3/uL (ref 150–400)
RBC: 3.69 MIL/uL — AB (ref 3.87–5.11)
RDW: 13.9 % (ref 11.5–15.5)
WBC: 6.7 10*3/uL (ref 4.0–10.5)

## 2014-03-26 LAB — ABO/RH: ABO/RH(D): A POS

## 2014-03-26 LAB — RPR

## 2014-03-28 MED ORDER — BUPIVACAINE LIPOSOME 1.3 % IJ SUSP
20.0000 mL | Freq: Once | INTRAMUSCULAR | Status: DC
  Filled 2014-03-28: qty 20

## 2014-03-29 ENCOUNTER — Inpatient Hospital Stay (HOSPITAL_COMMUNITY): Payer: BC Managed Care – PPO | Admitting: Certified Registered Nurse Anesthetist

## 2014-03-29 ENCOUNTER — Inpatient Hospital Stay (HOSPITAL_COMMUNITY)
Admission: RE | Admit: 2014-03-29 | Discharge: 2014-03-31 | DRG: 765 | Disposition: A | Discharge status: Home / Self Care | Payer: BC Managed Care – PPO | Source: Ambulatory Visit | Attending: Obstetrics & Gynecology | Admitting: Obstetrics & Gynecology

## 2014-03-29 ENCOUNTER — Encounter (HOSPITAL_COMMUNITY): Payer: Self-pay | Admitting: *Deleted

## 2014-03-29 ENCOUNTER — Encounter (HOSPITAL_COMMUNITY)
Admission: RE | Disposition: A | Discharge status: Home / Self Care | Payer: Self-pay | Source: Ambulatory Visit | Attending: Obstetrics & Gynecology

## 2014-03-29 DIAGNOSIS — Z3A39 39 weeks gestation of pregnancy: Secondary | ICD-10-CM | POA: Diagnosis present

## 2014-03-29 DIAGNOSIS — Z87410 Personal history of cervical dysplasia: Secondary | ICD-10-CM | POA: Diagnosis not present

## 2014-03-29 DIAGNOSIS — O9902 Anemia complicating childbirth: Secondary | ICD-10-CM | POA: Diagnosis present

## 2014-03-29 DIAGNOSIS — O99344 Other mental disorders complicating childbirth: Secondary | ICD-10-CM | POA: Diagnosis present

## 2014-03-29 DIAGNOSIS — G43909 Migraine, unspecified, not intractable, without status migrainosus: Secondary | ICD-10-CM | POA: Diagnosis present

## 2014-03-29 DIAGNOSIS — F53 Puerperal psychosis: Secondary | ICD-10-CM | POA: Diagnosis present

## 2014-03-29 DIAGNOSIS — Z87442 Personal history of urinary calculi: Secondary | ICD-10-CM

## 2014-03-29 DIAGNOSIS — O3421 Maternal care for scar from previous cesarean delivery: Principal | ICD-10-CM | POA: Diagnosis present

## 2014-03-29 DIAGNOSIS — Z98891 History of uterine scar from previous surgery: Secondary | ICD-10-CM

## 2014-03-29 DIAGNOSIS — Z853 Personal history of malignant neoplasm of breast: Secondary | ICD-10-CM

## 2014-03-29 DIAGNOSIS — D649 Anemia, unspecified: Secondary | ICD-10-CM | POA: Diagnosis present

## 2014-03-29 DIAGNOSIS — N858 Other specified noninflammatory disorders of uterus: Secondary | ICD-10-CM | POA: Diagnosis present

## 2014-03-29 DIAGNOSIS — O209 Hemorrhage in early pregnancy, unspecified: Secondary | ICD-10-CM

## 2014-03-29 DIAGNOSIS — O99354 Diseases of the nervous system complicating childbirth: Secondary | ICD-10-CM | POA: Diagnosis present

## 2014-03-29 LAB — PREPARE RBC (CROSSMATCH)

## 2014-03-29 SURGERY — Surgical Case
Anesthesia: Spinal

## 2014-03-29 MED ORDER — KETOROLAC TROMETHAMINE 30 MG/ML IJ SOLN
INTRAMUSCULAR | Status: AC
  Administered 2014-03-29: 30 mg via INTRAMUSCULAR
  Filled 2014-03-29: qty 1

## 2014-03-29 MED ORDER — WITCH HAZEL-GLYCERIN EX PADS: 1.0000 "application " | MEDICATED_PAD | CUTANEOUS | Status: DC | PRN

## 2014-03-29 MED ORDER — NALBUPHINE HCL 10 MG/ML IJ SOLN: 5.0000 mg | INTRAMUSCULAR | Status: DC | PRN

## 2014-03-29 MED ORDER — FENTANYL CITRATE 0.05 MG/ML IJ SOLN
INTRAMUSCULAR | Status: DC | PRN
  Administered 2014-03-29: 12.5 ug via INTRATHECAL

## 2014-03-29 MED ORDER — OXYTOCIN 40 UNITS IN LACTATED RINGERS INFUSION - SIMPLE MED: 62.5000 mL/h | INTRAVENOUS | Status: AC

## 2014-03-29 MED ORDER — MENTHOL 3 MG MT LOZG: 1.0000 | LOZENGE | OROMUCOSAL | Status: DC | PRN

## 2014-03-29 MED ORDER — KETOROLAC TROMETHAMINE 30 MG/ML IJ SOLN: 15.0000 mg | Freq: Once | INTRAMUSCULAR | Status: DC | PRN

## 2014-03-29 MED ORDER — DIPHENHYDRAMINE HCL 25 MG PO CAPS: 25.0000 mg | ORAL_CAPSULE | Freq: Four times a day (QID) | ORAL | Status: DC | PRN

## 2014-03-29 MED ORDER — PROMETHAZINE HCL 25 MG/ML IJ SOLN: 6.2500 mg | INTRAMUSCULAR | Status: DC | PRN

## 2014-03-29 MED ORDER — KETOROLAC TROMETHAMINE 30 MG/ML IJ SOLN
30.0000 mg | Freq: Four times a day (QID) | INTRAMUSCULAR | Status: AC | PRN
  Administered 2014-03-29: 30 mg via INTRAMUSCULAR

## 2014-03-29 MED ORDER — SODIUM CHLORIDE 0.9 % IJ SOLN: 3.0000 mL | INTRAMUSCULAR | Status: DC | PRN

## 2014-03-29 MED ORDER — SCOPOLAMINE 1 MG/3DAYS TD PT72
MEDICATED_PATCH | TRANSDERMAL | Status: AC
  Administered 2014-03-29: 1.5 mg via TRANSDERMAL
  Filled 2014-03-29: qty 1

## 2014-03-29 MED ORDER — MEPERIDINE HCL 25 MG/ML IJ SOLN: 6.2500 mg | INTRAMUSCULAR | Status: DC | PRN

## 2014-03-29 MED ORDER — MORPHINE SULFATE (PF) 0.5 MG/ML IJ SOLN
INTRAMUSCULAR | Status: DC | PRN
  Administered 2014-03-29: .2 mg via INTRATHECAL

## 2014-03-29 MED ORDER — ONDANSETRON HCL 4 MG/2ML IJ SOLN
INTRAMUSCULAR | Status: DC | PRN
  Administered 2014-03-29: 4 mg via INTRAVENOUS

## 2014-03-29 MED ORDER — IBUPROFEN 600 MG PO TABS: 600.0000 mg | ORAL_TABLET | Freq: Four times a day (QID) | ORAL | Status: DC | PRN

## 2014-03-29 MED ORDER — DIPHENHYDRAMINE HCL 25 MG PO CAPS
25.0000 mg | ORAL_CAPSULE | ORAL | Status: DC | PRN
  Filled 2014-03-29: qty 1

## 2014-03-29 MED ORDER — DIPHENHYDRAMINE HCL 50 MG/ML IJ SOLN
12.5000 mg | INTRAMUSCULAR | Status: DC | PRN
  Administered 2014-03-29: 12.5 mg via INTRAVENOUS
  Filled 2014-03-29 (×2): qty 1

## 2014-03-29 MED ORDER — ONDANSETRON 4 MG PO TBDP
4.0000 mg | ORAL_TABLET | Freq: Four times a day (QID) | ORAL | Status: DC | PRN
  Filled 2014-03-29: qty 1

## 2014-03-29 MED ORDER — ONDANSETRON HCL 4 MG PO TABS
4.0000 mg | ORAL_TABLET | ORAL | Status: DC | PRN
  Administered 2014-03-30: 4 mg via ORAL
  Filled 2014-03-29: qty 1

## 2014-03-29 MED ORDER — PHENYLEPHRINE HCL 10 MG/ML IJ SOLN
INTRAMUSCULAR | Status: AC
  Filled 2014-03-29: qty 1

## 2014-03-29 MED ORDER — DIBUCAINE 1 % RE OINT
1.0000 | TOPICAL_OINTMENT | RECTAL | Status: DC | PRN
Start: 2014-03-29 — End: 2014-03-31

## 2014-03-29 MED ORDER — CEFAZOLIN SODIUM-DEXTROSE 2-3 GM-% IV SOLR
2.0000 g | INTRAVENOUS | Status: AC
  Administered 2014-03-29: 2 g via INTRAVENOUS

## 2014-03-29 MED ORDER — BUPIVACAINE IN DEXTROSE 0.75-8.25 % IT SOLN
INTRATHECAL | Status: DC | PRN
  Administered 2014-03-29: 1.4 mL via INTRATHECAL

## 2014-03-29 MED ORDER — PHENYLEPHRINE 40 MCG/ML (10ML) SYRINGE FOR IV PUSH (FOR BLOOD PRESSURE SUPPORT)
PREFILLED_SYRINGE | INTRAVENOUS | Status: AC
  Filled 2014-03-29: qty 5

## 2014-03-29 MED ORDER — PHENYLEPHRINE 8 MG IN D5W 100 ML (0.08MG/ML) PREMIX OPTIME
INJECTION | INTRAVENOUS | Status: DC | PRN
  Administered 2014-03-29: 60 ug/min via INTRAVENOUS

## 2014-03-29 MED ORDER — HYDROMORPHONE HCL 1 MG/ML IJ SOLN
0.2500 mg | INTRAMUSCULAR | Status: DC | PRN
  Administered 2014-03-29 (×2): 0.5 mg via INTRAVENOUS

## 2014-03-29 MED ORDER — SCOPOLAMINE 1 MG/3DAYS TD PT72
1.0000 | MEDICATED_PATCH | Freq: Once | TRANSDERMAL | Status: DC
  Administered 2014-03-29: 1.5 mg via TRANSDERMAL

## 2014-03-29 MED ORDER — METHYLERGONOVINE MALEATE 0.2 MG PO TABS: 0.2000 mg | ORAL_TABLET | ORAL | Status: DC | PRN

## 2014-03-29 MED ORDER — OXYCODONE-ACETAMINOPHEN 5-325 MG PO TABS
2.0000 | ORAL_TABLET | ORAL | Status: DC | PRN
  Administered 2014-03-30 – 2014-03-31 (×3): 2 via ORAL
  Filled 2014-03-29 (×3): qty 2

## 2014-03-29 MED ORDER — SIMETHICONE 80 MG PO CHEW
80.0000 mg | CHEWABLE_TABLET | Freq: Three times a day (TID) | ORAL | Status: DC
  Administered 2014-03-30 – 2014-03-31 (×3): 80 mg via ORAL
  Filled 2014-03-29 (×3): qty 1

## 2014-03-29 MED ORDER — OXYCODONE-ACETAMINOPHEN 5-325 MG PO TABS
1.0000 | ORAL_TABLET | ORAL | Status: DC | PRN
  Administered 2014-03-30 (×2): 1 via ORAL
  Filled 2014-03-29 (×2): qty 1

## 2014-03-29 MED ORDER — BUPIVACAINE LIPOSOME 1.3 % IJ SUSP
INTRAMUSCULAR | Status: DC | PRN
  Administered 2014-03-29: 60 mL

## 2014-03-29 MED ORDER — CEFAZOLIN SODIUM-DEXTROSE 2-3 GM-% IV SOLR
INTRAVENOUS | Status: AC
  Filled 2014-03-29: qty 50

## 2014-03-29 MED ORDER — ONDANSETRON HCL 4 MG/2ML IJ SOLN: 4.0000 mg | Freq: Three times a day (TID) | INTRAMUSCULAR | Status: DC | PRN

## 2014-03-29 MED ORDER — NALOXONE HCL 1 MG/ML IJ SOLN
1.0000 ug/kg/h | INTRAMUSCULAR | Status: DC | PRN
  Filled 2014-03-29: qty 2

## 2014-03-29 MED ORDER — ONDANSETRON HCL 4 MG/2ML IJ SOLN: 4.0000 mg | INTRAMUSCULAR | Status: DC | PRN

## 2014-03-29 MED ORDER — ZOLPIDEM TARTRATE 5 MG PO TABS: 5.0000 mg | ORAL_TABLET | Freq: Every evening | ORAL | Status: DC | PRN

## 2014-03-29 MED ORDER — IBUPROFEN 600 MG PO TABS
600.0000 mg | ORAL_TABLET | Freq: Four times a day (QID) | ORAL | Status: DC
  Administered 2014-03-30 – 2014-03-31 (×7): 600 mg via ORAL
  Filled 2014-03-29 (×7): qty 1

## 2014-03-29 MED ORDER — SIMETHICONE 80 MG PO CHEW: 80.0000 mg | CHEWABLE_TABLET | ORAL | Status: DC | PRN

## 2014-03-29 MED ORDER — NALBUPHINE HCL 10 MG/ML IJ SOLN: 5.0000 mg | Freq: Once | INTRAMUSCULAR | Status: AC | PRN

## 2014-03-29 MED ORDER — OXYTOCIN 10 UNIT/ML IJ SOLN
40.0000 [IU] | INTRAMUSCULAR | Status: DC | PRN
  Administered 2014-03-29: 40 [IU] via INTRAVENOUS

## 2014-03-29 MED ORDER — BUPIVACAINE LIPOSOME 1.3 % IJ SUSP: 20.0000 mL | Freq: Once | INTRAMUSCULAR | Status: DC

## 2014-03-29 MED ORDER — LANOLIN HYDROUS EX OINT: 1.0000 "application " | TOPICAL_OINTMENT | CUTANEOUS | Status: DC | PRN

## 2014-03-29 MED ORDER — LACTATED RINGERS IV SOLN
INTRAVENOUS | Status: DC
  Administered 2014-03-29 – 2014-03-30 (×2): via INTRAVENOUS

## 2014-03-29 MED ORDER — LACTATED RINGERS IV SOLN
INTRAVENOUS | Status: DC
  Administered 2014-03-29 (×3): via INTRAVENOUS

## 2014-03-29 MED ORDER — SIMETHICONE 80 MG PO CHEW
80.0000 mg | CHEWABLE_TABLET | ORAL | Status: DC
  Administered 2014-03-30 – 2014-03-31 (×2): 80 mg via ORAL
  Filled 2014-03-29 (×2): qty 1

## 2014-03-29 MED ORDER — HYDROMORPHONE HCL 1 MG/ML IJ SOLN
INTRAMUSCULAR | Status: AC
  Administered 2014-03-29: 0.5 mg via INTRAVENOUS
  Filled 2014-03-29: qty 1

## 2014-03-29 MED ORDER — NALOXONE HCL 0.4 MG/ML IJ SOLN: 0.4000 mg | INTRAMUSCULAR | Status: DC | PRN

## 2014-03-29 MED ORDER — SENNOSIDES-DOCUSATE SODIUM 8.6-50 MG PO TABS
2.0000 | ORAL_TABLET | ORAL | Status: DC
  Administered 2014-03-30 – 2014-03-31 (×2): 2 via ORAL
  Filled 2014-03-29 (×2): qty 2

## 2014-03-29 MED ORDER — MORPHINE SULFATE 0.5 MG/ML IJ SOLN
INTRAMUSCULAR | Status: AC
  Filled 2014-03-29: qty 10

## 2014-03-29 MED ORDER — TETANUS-DIPHTH-ACELL PERTUSSIS 5-2.5-18.5 LF-MCG/0.5 IM SUSP: 0.5000 mL | Freq: Once | INTRAMUSCULAR | Status: DC

## 2014-03-29 MED ORDER — OXYTOCIN 10 UNIT/ML IJ SOLN
INTRAMUSCULAR | Status: AC
  Filled 2014-03-29: qty 4

## 2014-03-29 MED ORDER — SODIUM CHLORIDE 0.9 % IJ SOLN
INTRAMUSCULAR | Status: AC
  Filled 2014-03-29: qty 50

## 2014-03-29 MED ORDER — KETOROLAC TROMETHAMINE 30 MG/ML IJ SOLN: 30.0000 mg | Freq: Four times a day (QID) | INTRAMUSCULAR | Status: AC | PRN

## 2014-03-29 MED ORDER — METHYLERGONOVINE MALEATE 0.2 MG/ML IJ SOLN: 0.2000 mg | INTRAMUSCULAR | Status: DC | PRN

## 2014-03-29 MED ORDER — PRENATAL MULTIVITAMIN CH
1.0000 | ORAL_TABLET | Freq: Every day | ORAL | Status: DC
  Administered 2014-03-30 – 2014-03-31 (×2): 1 via ORAL
  Filled 2014-03-29 (×2): qty 1

## 2014-03-29 MED ORDER — ONDANSETRON HCL 4 MG/2ML IJ SOLN
INTRAMUSCULAR | Status: AC
  Filled 2014-03-29: qty 2

## 2014-03-29 MED ORDER — FENTANYL CITRATE 0.05 MG/ML IJ SOLN
INTRAMUSCULAR | Status: AC
  Filled 2014-03-29: qty 2

## 2014-03-29 MED ORDER — NALBUPHINE HCL 10 MG/ML IJ SOLN
5.0000 mg | Freq: Once | INTRAMUSCULAR | Status: AC | PRN
Start: 2014-03-29 — End: 2014-03-29

## 2014-03-29 SURGICAL SUPPLY — 37 items
CLAMP CORD UMBIL (MISCELLANEOUS) ×1 IMPLANT
CLOTH BEACON ORANGE TIMEOUT ST (SAFETY) ×2 IMPLANT
DRAPE SHEET LG 3/4 BI-LAMINATE (DRAPES) ×1 IMPLANT
DRSG OPSITE POSTOP 4X10 (GAUZE/BANDAGES/DRESSINGS) ×2 IMPLANT
DURAPREP 26ML APPLICATOR (WOUND CARE) ×4 IMPLANT
ELECT REM PT RETURN 9FT ADLT (ELECTROSURGICAL) ×2
ELECTRODE REM PT RTRN 9FT ADLT (ELECTROSURGICAL) ×1 IMPLANT
EXTRACTOR VACUUM BELL STYLE (SUCTIONS) ×1 IMPLANT
GLOVE BIOGEL PI IND STRL 8 (GLOVE) ×1 IMPLANT
GLOVE BIOGEL PI INDICATOR 8 (GLOVE) ×1
GLOVE ECLIPSE 8.0 STRL XLNG CF (GLOVE) ×2 IMPLANT
GOWN STRL REUS W/TWL LRG LVL3 (GOWN DISPOSABLE) ×4 IMPLANT
KIT ABG SYR 3ML LUER SLIP (SYRINGE) ×2 IMPLANT
LIQUID BAND (GAUZE/BANDAGES/DRESSINGS) ×3 IMPLANT
NDL HYPO 18GX1.5 BLUNT FILL (NEEDLE) ×1 IMPLANT
NDL HYPO 25X5/8 SAFETYGLIDE (NEEDLE) ×1 IMPLANT
NEEDLE HYPO 18GX1.5 BLUNT FILL (NEEDLE) ×2 IMPLANT
NEEDLE HYPO 22GX1.5 SAFETY (NEEDLE) ×2 IMPLANT
NEEDLE HYPO 25X5/8 SAFETYGLIDE (NEEDLE) ×2 IMPLANT
NS IRRIG 1000ML POUR BTL (IV SOLUTION) ×2 IMPLANT
PACK C SECTION WH (CUSTOM PROCEDURE TRAY) ×2 IMPLANT
PAD OB MATERNITY 4.3X12.25 (PERSONAL CARE ITEMS) ×2 IMPLANT
RTRCTR C-SECT PINK 25CM LRG (MISCELLANEOUS) IMPLANT
STAPLER VISISTAT 35W (STAPLE) IMPLANT
SUT CHROMIC 0 CT 1 (SUTURE) ×2 IMPLANT
SUT MNCRL 0 VIOLET CTX 36 (SUTURE) ×2 IMPLANT
SUT MONOCRYL 0 CTX 36 (SUTURE) ×2
SUT PLAIN 2 0 (SUTURE)
SUT PLAIN 2 0 XLH (SUTURE) IMPLANT
SUT PLAIN ABS 2-0 CT1 27XMFL (SUTURE) IMPLANT
SUT VIC AB 0 CTX 36 (SUTURE) ×2
SUT VIC AB 0 CTX36XBRD ANBCTRL (SUTURE) ×1 IMPLANT
SUT VIC AB 4-0 KS 27 (SUTURE) ×1 IMPLANT
SYR 20CC LL (SYRINGE) ×3 IMPLANT
TOWEL OR 17X24 6PK STRL BLUE (TOWEL DISPOSABLE) ×2 IMPLANT
TRAY FOLEY CATH 14FR (SET/KITS/TRAYS/PACK) IMPLANT
WATER STERILE IRR 1000ML POUR (IV SOLUTION) ×2 IMPLANT

## 2014-03-29 NOTE — Plan of Care (Signed)
Problem: Consults Goal: Postpartum Patient Education (See Patient Education module for education specifics.)  Outcome: Progressing  Problem: Phase I Progression Outcomes Goal: Pain controlled with appropriate interventions Outcome: Progressing Goal: Foley catheter patent Outcome: Completed/Met Date Met:  03/29/14 Goal: IS, TCDB as ordered Outcome: Completed/Met Date Met:  03/29/14 Goal: Initial discharge plan identified Outcome: Completed/Met Date Met:  03/29/14

## 2014-03-29 NOTE — Op Note (Signed)
Preoperative diagnosis:  1.  Intrauterine pregnancy at [redacted]w[redacted]d  weeks gestation                                         2.  Previous Caesarean section                                         3.  Declines TOL   Postoperative diagnosis:  Same as above   Procedure: Repeat Caesarean section  Surgeon:  Florian Buff MD  Assistant:    Anesthesia: Spinal  Findings:  Uncomplicated, used vacuum extractor.    Over a low transverse incision was delivered a viable female with Apgars of 8 and 10 weighing 8 lbs. 1 oz. Uterus, tubes and ovaries were all normal.  There were no other significant findings  Description of operation:  Patient was taken to the operating room and placed in the sitting position where she underwent a spinal anesthetic. She was then placed in the supine position with tilt to the left side. When adequate anesthetic level was obtained she was prepped and draped in usual sterile fashion and a Foley catheter was placed. A Pfannenstiel skin incision was made and carried down sharply to the rectus fascia which was scored in the midline extended laterally. The fascia was taken off the muscles both superiorly and without difficulty. The muscles were divided.  The peritoneal cavity was entered.  Bladder blade was placed, no bladder flap was created.  A low transverse hysterotomy incision was made and delivered a viable female  infant at 77 with Apgars of 8 and 10 weighing8 lbs 1 oz.  Cord pH was obtained and was pending. The uterus was exteriorized. It was closed in 2 layers, the first being a running interlocking layer and the second being an imbricating layer using 0 monocryl on a CTX needle. There was good resulting hemostasis. The uterus tubes and ovaries were all normal. Peritoneal cavity was irrigated vigorously. The muscles and peritoneum were reapproximated loosely. The fascia was closed using 0 Vicryl in running fashion. Subcutaneous tissue was made hemostatic and irrigated. The skin was  closed using 4-0 Vicryl on a Keith needle in a subcuticular fashion.  Dermabond was placed for additional wound integrity and to serve as a barrier. Blood loss for the procedure was 650 cc. The patient received a gram of Ancef prophylactically. The patient was taken to the recovery room in good stable condition with all counts being correct x3.  EBL 650 cc  Kia Stavros H 03/29/2014 2:52 PM

## 2014-03-29 NOTE — Lactation Note (Signed)
This note was copied from the chart of Julia Christne Platts. Lactation Consultation Note Initial visit at 9 hours of age.  Mom reports baby is difficult to latch.  [facility] RN gave mom a #20 NS due to difficult latch.  Mom has everted nipples with compressible areolas.  Baby asleep in crib, but last feeding about 3 hours ago.  Awakened baby and place STS in football hold.  Baby sleepy and only a few sucks, but didn't maintain a good latch.  Encouraged mom to try hand pump and latch prior to NS use.  Mom given foley cup to offer feedings if needed when she hand expresses she has copious colostrum flowing.  [facility] LC resources given and discussed.  Encouraged to feed with early cues on demand.  Early newborn behavior discussed.  Report given to [facility] RN.   Mom to call for assist as needed.    Patient Name: Julia Rangel [id number] Date: 03/29/2014 Reason for consult: Initial assessment   Maternal Data Has patient been taught Hand Expression?: Yes Does the patient have breastfeeding experience prior to this delivery?: No (attempted 1-2 days )  Feeding Feeding Type: Breast Fed Length of feed: 5 min  LATCH Score/Interventions Latch: Repeated attempts needed to sustain latch, nipple held in mouth throughout feeding, stimulation needed to elicit sucking reflex. Intervention(s): Adjust position;Assist with latch;Breast massage;Breast compression  Audible Swallowing: None Intervention(s): Skin to skin;Hand expression  Type of Nipple: Everted at rest and after stimulation  Comfort (Breast/Nipple): Soft / non-tender     Hold (Positioning): Assistance needed to correctly position infant at breast and maintain latch. Intervention(s): Skin to skin;Position options;Support Pillows;Breastfeeding basics reviewed  LATCH Score: 6  Lactation Tools Discussed/Used Tools: Nipple Shields (#20) Nipple shield size: 20 Initiated by:: JS Date initiated:: 03/29/14   Consult Status Consult Status: Follow-up Date:  03/30/14    [name] 03/29/2014, 11:23 PM

## 2014-03-29 NOTE — H&P (Signed)
Preoperative History and Physical  Julia Rangel is a 28 y.o. G2P1001 with Patient's last menstrual period was 06/29/2013. [redacted]w[redacted]d Estimated Date of Delivery: 04/05/14 admitted for a repeat Caesarean section.  Previous declines TOL  PMH:    Past Medical History  Diagnosis Date  . Migraines     since age 48 per patient  . 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 Dysplasia of cervix   . Anemia   . Menorrhagia   . Patient desires pregnancy 06/01/2013  . PONV (postoperative nausea and vomiting)   . PMDD (premenstrual dysphoric disorder)     PROZAC 20 MG BUT NONE WITH PREGNANCY  . Kidney stone     PSH:     Past Surgical History  Procedure Laterality Date  . Leep  2008  . Kidney stone surgery  2010    stent  . Cesarean section  2010  . Phylloid removed  2009    left breast  . Tonsillectomy      POb/GynH:      OB History    Gravida Para Term Preterm AB TAB SAB Ectopic Multiple Living   2 1 1       1       SH:   History  Substance Use Topics  . Smoking status: Never Smoker   . Smokeless tobacco: Never Used  . Alcohol Use: No     Comment: occ    FH:    Family History  Problem Relation Age of Onset  . Diabetes Maternal Grandmother   . Cancer Maternal Grandmother     breast  . Diabetes Paternal Grandmother   . Cancer Paternal Grandmother     breast     Allergies: No Known Allergies  Medications:      Current facility-administered medications: bupivacaine liposome (EXPAREL) 1.3 % injection 266 mg, 20 mL, Infiltration, Once, Florian Buff, MD;  ceFAZolin (ANCEF) 2-3 GM-% IVPB SOLR, , , , ;  ceFAZolin (ANCEF) IVPB 2 g/50 mL premix, 2 g, Intravenous, On Call to OR, Florian Buff, MD ketorolac (TORADOL) 30 MG/ML injection 30 mg, 30 mg, Intravenous, Q6H PRN **OR** ketorolac (TORADOL) 30 MG/ML injection 30 mg, 30 mg, Intramuscular, Q6H PRN, Lyn Hollingshead, MD;  lactated ringers infusion, , Intravenous, Continuous, Lyn Hollingshead,  MD, Last Rate: 125 mL/hr at 03/29/14 1254;  scopolamine (TRANSDERM-SCOP) 1 MG/3DAYS 1.5 mg, 1 patch, Transdermal, Once, Lyn Hollingshead, MD, 1.5 mg at 03/29/14 1254  Review of Systems:   Review of Systems  Constitutional: Negative for fever, chills, weight loss, malaise/fatigue and diaphoresis.  HENT: Negative for hearing loss, ear pain, nosebleeds, congestion, sore throat, neck pain, tinnitus and ear discharge.   Eyes: Negative for blurred vision, double vision, photophobia, pain, discharge and redness.  Respiratory: Negative for cough, hemoptysis, sputum production, shortness of breath, wheezing and stridor.   Cardiovascular: Negative for chest pain, palpitations, orthopnea, claudication, leg swelling and PND.  Gastrointestinal: Positive for abdominal pain. Negative for heartburn, nausea, vomiting, diarrhea, constipation, blood in stool and melena.  Genitourinary: Negative for dysuria, urgency, frequency, hematuria and flank pain.  Musculoskeletal: Negative for myalgias, back pain, joint pain and falls.  Skin: Negative for itching and rash.  Neurological: Negative for dizziness, tingling, tremors, sensory change, speech change, focal weakness, seizures, loss of consciousness, weakness and headaches.  Endo/Heme/Allergies: Negative for environmental allergies and polydipsia. Does not bruise/bleed easily.  Psychiatric/Behavioral: Negative for depression, suicidal ideas, hallucinations, memory loss and substance abuse. The patient  is not nervous/anxious and does not have insomnia.      PHYSICAL EXAM:  Blood pressure 120/64, pulse 99, temperature 97.7 F (36.5 C), temperature source Oral, resp. rate 18, last menstrual period 06/29/2013, SpO2 100 %.    Vitals reviewed. Constitutional: She is oriented to person, place, and time. She appears well-developed and well-nourished.  HENT:  Head: Normocephalic and atraumatic.  Right Ear: External ear normal.  Left Ear: External ear normal.   Nose: Nose normal.  Mouth/Throat: Oropharynx is clear and moist.  Eyes: Conjunctivae and EOM are normal. Pupils are equal, round, and reactive to light. Right eye exhibits no discharge. Left eye exhibits no discharge. No scleral icterus.  Neck: Normal range of motion. Neck supple. No tracheal deviation present. No thyromegaly present.  Cardiovascular: Normal rate, regular rhythm, normal heart sounds and intact distal pulses.  Exam reveals no gallop and no friction rub.   No murmur heard. Respiratory: Effort normal and breath sounds normal. No respiratory distress. She has no wheezes. She has no rales. She exhibits no tenderness.  GI: Soft. Bowel sounds are normal. She exhibits no distension and no mass. There is tenderness. There is no rebound and no guarding.  Genitourinary:       Vulva is normal without lesions Vagina is pink moist without discharge Cervix normal in appearance and pap is normal Uterus is enlarged term size  Adnexa is negative with normal sized ovaries by sonogram  Musculoskeletal: Normal range of motion. She exhibits no edema and no tenderness.  Neurological: She is alert and oriented to person, place, and time. She has normal reflexes. She displays normal reflexes. No cranial nerve deficit. She exhibits normal muscle tone. Coordination normal.  Skin: Skin is warm and dry. No rash noted. No erythema. No pallor.  Psychiatric: She has a normal mood and affect. Her behavior is normal. Judgment and thought content normal.    Labs: Results for orders placed or performed during the hospital encounter of 03/26/14 (from the past 336 hour(s))  ABO/Rh   Collection Time: 03/26/14  9:37 AM  Result Value Ref Range   ABO/RH(D) A POS   CBC   Collection Time: 03/26/14  9:39 AM  Result Value Ref Range   WBC 6.7 4.0 - 10.5 K/uL   RBC 3.69 (L) 3.87 - 5.11 MIL/uL   Hemoglobin 10.9 (L) 12.0 - 15.0 g/dL   HCT 32.6 (L) 36.0 - 46.0 %   MCV 88.3 78.0 - 100.0 fL   MCH 29.5 26.0 - 34.0  pg   MCHC 33.4 30.0 - 36.0 g/dL   RDW 13.9 11.5 - 15.5 %   Platelets 162 150 - 400 K/uL  RPR   Collection Time: 03/26/14  9:39 AM  Result Value Ref Range   RPR NON REAC NON REAC  Type and screen   Collection Time: 03/26/14  9:40 AM  Result Value Ref Range   ABO/RH(D) A POS    Antibody Screen NEG    Sample Expiration 03/29/2014   Results for orders placed or performed in visit on 03/23/14 (from the past 336 hour(s))  POCT urinalysis dipstick   Collection Time: 03/23/14  4:24 PM  Result Value Ref Range   Color, UA     Clarity, UA     Glucose, UA neg    Bilirubin, UA     Ketones, UA neg    Spec Grav, UA     Blood, UA neg    pH, UA     Protein, UA neg  Urobilinogen, UA     Nitrite, UA neg    Leukocytes, UA Negative   Results for orders placed or performed in visit on 03/16/14 (from the past 336 hour(s))  POCT urinalysis dipstick   Collection Time: 03/16/14  5:00 PM  Result Value Ref Range   Color, UA     Clarity, UA     Glucose, UA neg    Bilirubin, UA     Ketones, UA neg    Spec Grav, UA     Blood, UA neg    pH, UA     Protein, UA neg    Urobilinogen, UA     Nitrite, UA neg    Leukocytes, UA Negative   Strep B DNA Probe   Collection Time: 03/16/14  5:02 PM  Result Value Ref Range   GBSP NOT DETECTED   GC/Chlamydia Probe Amp   Collection Time: 03/16/14  5:02 PM  Result Value Ref Range   CT Probe RNA NEGATIVE    GC Probe RNA NEGATIVE     EKG: No orders found for this or any previous visit.  Imaging Studies: No results found.    Assessment: [redacted]w[redacted]d Estimated Date of Delivery: 04/05/14  Previous Caesarean section Patient Active Problem List   Diagnosis Date Noted  . Supervision of other normal pregnancy 08/31/2013  . First trimester bleeding 08/31/2013  . Previous cesarean section 08/31/2013  . Personal history of malignant phylloides tumor of breast 09/19/2012  . Anemia 09/19/2012  . Menorrhagia 09/19/2012  . PMDD (premenstrual dysphoric disorder)  09/19/2012  . Dysplasia of cervix 09/19/2012    Plan: Repeat caesarean section  Elexius Minar H 03/29/2014 1:41 PM

## 2014-03-29 NOTE — Transfer of Care (Signed)
Immediate Anesthesia Transfer of Care Note  Patient: Julia Rangel  Procedure(s) Performed: Procedure(s): REPEAT CESAREAN SECTION (N/A)  Patient Location: PACU  Anesthesia Type:Spinal  Level of Consciousness: awake, alert  and oriented  Airway & Oxygen Therapy: Patient Spontanous Breathing  Post-op Assessment: Report given to PACU RN  Post vital signs: Reviewed and stable  Complications: No apparent anesthesia complications

## 2014-03-29 NOTE — Anesthesia Preprocedure Evaluation (Signed)
Anesthesia Evaluation  Patient identified by MRN, date of birth, ID band Patient awake    Reviewed: Allergy & Precautions, H&P , NPO status , Patient's Chart, lab work & pertinent test results  Airway Mallampati: II  TM Distance: >3 FB Neck ROM: full    Dental no notable dental hx.    Pulmonary neg pulmonary ROS,    Pulmonary exam normal       Cardiovascular negative cardio ROS      Neuro/Psych    GI/Hepatic negative GI ROS, Neg liver ROS,   Endo/Other  negative endocrine ROS  Renal/GU      Musculoskeletal   Abdominal Normal abdominal exam  (+)   Peds  Hematology   Anesthesia Other Findings   Reproductive/Obstetrics (+) Pregnancy                             Anesthesia Physical Anesthesia Plan  ASA: II  Anesthesia Plan: Spinal   Post-op Pain Management:    Induction:   Airway Management Planned:   Additional Equipment:   Intra-op Plan:   Post-operative Plan:   Informed Consent: I have reviewed the patients History and Physical, chart, labs and discussed the procedure including the risks, benefits and alternatives for the proposed anesthesia with the patient or authorized representative who has indicated his/her understanding and acceptance.     Plan Discussed with: CRNA and Surgeon  Anesthesia Plan Comments:         Anesthesia Quick Evaluation

## 2014-03-29 NOTE — Anesthesia Postprocedure Evaluation (Signed)
Anesthesia Post Note  Patient: Julia Rangel  Procedure(s) Performed: Procedure(s) (LRB): REPEAT CESAREAN SECTION (N/A)  Anesthesia type: Spinal  Patient location: PACU  Post pain: Pain level controlled  Post assessment: Post-op Vital signs reviewed  Last Vitals:  Filed Vitals:   03/29/14 1600  BP:   Pulse: 83  Temp:   Resp: 22    Post vital signs: Reviewed  Level of consciousness: awake  Complications: No apparent anesthesia complications

## 2014-03-29 NOTE — Anesthesia Procedure Notes (Signed)
Spinal Patient location during procedure: OR Start time: 03/29/2014 1:55 PM End time: 03/29/2014 2:00 PM Staffing Anesthesiologist: Lyn Hollingshead Preanesthetic Checklist Completed: patient identified, surgical consent, pre-op evaluation, timeout performed, IV checked, risks and benefits discussed and monitors and equipment checked Spinal Block Patient position: sitting Prep: site prepped and draped and DuraPrep Patient monitoring: heart rate, cardiac monitor, continuous pulse ox and blood pressure Approach: midline Location: L3-4 Injection technique: single-shot Needle Needle type: Pencan  Needle gauge: 24 G Needle length: 9 cm Needle insertion depth: 6 cm Assessment Sensory level: T4

## 2014-03-29 NOTE — Plan of Care (Signed)
Problem: Phase I Progression Outcomes Goal: Pain controlled with appropriate interventions Outcome: Completed/Met Date Met:  03/29/14 Goal: OOB as tolerated unless otherwise ordered Outcome: Completed/Met Date Met:  03/29/14

## 2014-03-30 ENCOUNTER — Encounter (HOSPITAL_COMMUNITY): Payer: Self-pay | Admitting: Obstetrics & Gynecology

## 2014-03-30 LAB — TYPE AND SCREEN
ABO/RH(D): A POS
ANTIBODY SCREEN: NEGATIVE
UNIT DIVISION: 0
Unit division: 0

## 2014-03-30 LAB — CBC
HCT: 28.2 % — ABNORMAL LOW (ref 36.0–46.0)
Hemoglobin: 9.4 g/dL — ABNORMAL LOW (ref 12.0–15.0)
MCH: 29.5 pg (ref 26.0–34.0)
MCHC: 33.3 g/dL (ref 30.0–36.0)
MCV: 88.4 fL (ref 78.0–100.0)
PLATELETS: 138 10*3/uL — AB (ref 150–400)
RBC: 3.19 MIL/uL — ABNORMAL LOW (ref 3.87–5.11)
RDW: 14.2 % (ref 11.5–15.5)
WBC: 7.7 10*3/uL (ref 4.0–10.5)

## 2014-03-30 NOTE — Anesthesia Postprocedure Evaluation (Signed)
Anesthesia Post Note  Patient: Julia Rangel  Procedure(s) Performed: Procedure(s) (LRB): REPEAT CESAREAN SECTION (N/A)  Anesthesia type: SAB  Patient location: Mother/Baby  Post pain: Pain level controlled  Post assessment: Post-op Vital signs reviewed  Last Vitals:  Filed Vitals:   03/30/14 0600  BP: 98/50  Pulse: 82  Temp: 36.6 C  Resp: 18    Post vital signs: Reviewed  Level of consciousness: awake  Complications: No apparent anesthesia complications

## 2014-03-30 NOTE — Plan of Care (Signed)
Problem: Phase I Progression Outcomes Goal: VS, stable, temp < 100.4 degrees F Outcome: Completed/Met Date Met:  03/30/14 Goal: Other Phase I Outcomes/Goals Outcome: Not Applicable Date Met:  11/55/20

## 2014-03-30 NOTE — Lactation Note (Signed)
This note was copied from the chart of Julia Rangel. Lactation Consultation Note  Patient Name: Julia Kalifa Cadden [id number] Date: 03/30/2014  Mom had requested additional [facility] consult.  Upon arrival into room, older sibling was visiting baby.  Mom requested that we defer the consultation for now. Mom has my # to call when ready for consult.  Matthias Hughs [facility] 03/30/2014, 4:05 PM

## 2014-03-30 NOTE — Lactation Note (Addendum)
This note was copied from the chart of Julia Rangel. Lactation Consultation Note  Patient Name: Julia Rangel [id number] Date: 03/30/2014 Reason for consult: Follow-up assessment  Baby is able to latch to bare breast (Mom's R nipple noted to evert w/stimulation). However, baby falls asleep and will not continue to suckle.  Mom taught hand-expression and baby fed a total of 3 mL.  Mom has my # to call for assist w/next feeding. Some good tongue protrusion noted when baby was cueing prior to latching.  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    Matthias Hughs [facility] 03/30/2014, 5:12 PM

## 2014-03-30 NOTE — Lactation Note (Signed)
This note was copied from the chart of Julia Rangel. Lactation Consultation Note  Patient Name: Julia Alyssandra Hulsebus [id number] Date: 03/30/2014 Reason for consult: Follow-up assessment   Follow-up visit at 65 hrs old.  Infant has breastfed x4 (10-15 & 60 minute feeds - all since midnight) + attempts x6 (0-5 min) since birth 22 hrs ago; voids-5; stools-2.  Reported that mom was getting frustrated with trying to get a sleepy infant to latch and suck.  [place] visited mom and mom addressed concerns.  Attempted to awaken infant; when [place] got infant to stirring tried latching, infant latched but would not suck.  Taught mom asymmetrical latching technique with sandwiching breast and importance of feeding skin-to-skin.  Encouragement given regarding normal newborn behaviors.  Reviewed cluster feeding and watching for early feeding cues.  Reviewed hand expression with return demonstration and observation of colostrum.  Mom has been feeding with nipple shield and reports the last 4 feedings were with shield which were more sustained feedings.  When gloved finger put in infant's mouth noted a semi-high palate.  Discussed with mom to roll nipple to help with getting erect prior to latching or pre-pump so infant will feel nipple in mouth; discussed how nipple shield could help with initial latch and a sitting football hold so infant can feel nipple in mouth.  Mom has a hand pump in room. Instructed mom to call for a latch check with next feeding.      Maternal Data Formula Feeding for Exclusion: No Has patient been taught Hand Expression?: Yes Does the patient have breastfeeding experience prior to this delivery?: No  Feeding Feeding Type: Breast Fed Length of feed: 0 min (few sucks)  LATCH Score/Interventions Latch: Too sleepy or reluctant, no latch achieved, no sucking elicited. Intervention(s): Skin to skin;Teach feeding cues;Waking techniques Intervention(s): Breast compression  Audible Swallowing:  None  Type of Nipple: Everted at rest and after stimulation  Comfort (Breast/Nipple): Soft / non-tender     Hold (Positioning): Assistance needed to correctly position infant at breast and maintain latch. Intervention(s): Breastfeeding basics reviewed;Support Pillows;Position options;Skin to skin  LATCH Score: 5  Lactation Tools Discussed/Used     Consult Status Consult Status: Follow-up Date: 03/31/14 Follow-up type: In-patient    Merlene Laughter 03/30/2014, 1:19 PM

## 2014-03-30 NOTE — Plan of Care (Signed)
Problem: Discharge Progression Outcomes Goal: Barriers To Progression Addressed/Resolved Outcome: Not Applicable Date Met:  16/83/87 Goal: Complications resolved/controlled Outcome: Not Applicable Date Met:  06/58/26

## 2014-03-30 NOTE — Addendum Note (Signed)
Addendum  created 03/30/14 [id number] by Talbot Grumbling, CRNA   Modules edited: Notes Section   Notes Section:  File: [id number]

## 2014-03-30 NOTE — Progress Notes (Signed)
Subjective: Postpartum Day 1: Cesarean Delivery Patient reports: Not yet voiding or stooling. - Flatus.  Tolerating PO with no N/V.   Objective: Vital signs in last 24 hours: Temp:  [97.6 F (36.4 C)-98.9 F (37.2 C)] 97.9 F (36.6 C) (11/24 0600) Pulse Rate:  [75-102] 82 (11/24 0600) Resp:  [12-22] 18 (11/24 0600) BP: (87-120)/(43-70) 98/50 mmHg (11/24 0600) SpO2:  [95 %-100 %] 96 % (11/24 0600)  Physical Exam:  General: alert, cooperative, appears stated age and no distress Lochia: appropriate Uterine Fundus: firm Incision: healing well, no significant drainage DVT Evaluation: No evidence of DVT seen on physical exam. Negative Homan's sign. Calf/Ankle edema is present.   Recent Labs  03/30/14 0535  HGB 9.4*  HCT 28.2*    Assessment/Plan: Status post Cesarean section. Doing well postoperatively.  Continue current care.  Gevena Barre, [place]   03/30/2014, 8:50 AM

## 2014-03-30 NOTE — Plan of Care (Signed)
Problem: Consults Goal: Postpartum Patient Education (See Patient Education module for education specifics.)  Outcome: Completed/Met Date Met:  03/30/14  Problem: Phase I Progression Outcomes Goal: Voiding adequately Outcome: Completed/Met Date Met:  03/30/14  Problem: Phase II Progression Outcomes Goal: Pain controlled on oral analgesia Outcome: Completed/Met Date Met:  03/30/14 Goal: Progress activity as tolerated unless otherwise ordered Outcome: Completed/Met Date Met:  03/30/14 Goal: Afebrile, VS remain stable Outcome: Completed/Met Date Met:  03/30/14 Goal: Incision intact & without signs/symptoms of infection Outcome: Completed/Met Date Met:  03/30/14 Goal: Tolerating diet Outcome: Completed/Met Date Met:  03/30/14 Goal: Other Phase II Outcomes/Goals Outcome: Not Applicable Date Met:  63/78/58  Problem: Discharge Progression Outcomes Goal: Tolerating diet Outcome: Completed/Met Date Met:  03/30/14 Goal: Other Discharge Outcomes/Goals Outcome: Not Applicable Date Met:  85/02/77

## 2014-03-30 NOTE — Lactation Note (Signed)
This note was copied from the chart of Julia Maddeline Roorda. Lactation Consultation Note  Patient Name: Julia Rangel [id number] Date: 03/30/2014 Reason for consult: Follow-up assessment  Baby ravenous when I entered room; baby unable to latch.  Baby finger-fed formula (with parents' consent) via curved-tip syringe.  Baby put to breast, but still wouldn't latch.  Baby latched w/ease once nipple shield was reapplied & it was prefilled w/formula.  Dad taught how to instill formula into tip of nipple shield.    Mom advised to add some pumping tomorrow, but just sleep & feed baby tonight.  Matthias Hughs [facility] 03/30/2014, 8:15 PM

## 2014-03-31 MED ORDER — FLUOXETINE HCL 20 MG PO CAPS
20.0000 mg | ORAL_CAPSULE | Freq: Every day | ORAL | Status: DC
  Administered 2014-03-31: 20 mg via ORAL
  Filled 2014-03-31 (×2): qty 1

## 2014-03-31 MED ORDER — OXYCODONE-ACETAMINOPHEN 5-325 MG PO TABS: 1.0000 | ORAL_TABLET | ORAL | Status: DC | PRN

## 2014-03-31 MED ORDER — FLUOXETINE HCL 20 MG PO CAPS: 20.0000 mg | ORAL_CAPSULE | Freq: Every day | ORAL | Status: DC

## 2014-03-31 MED ORDER — IBUPROFEN 600 MG PO TABS: 600.0000 mg | ORAL_TABLET | ORAL | Status: DC | PRN

## 2014-03-31 NOTE — Discharge Summary (Signed)
Obstetric Discharge Summary Reason for Admission: cesarean section Prenatal Procedures: none Intrapartum Procedures: vacuum and cesarean: low cervical, transverse Postpartum Procedures: none Complications-Operative and Postpartum: none HEMOGLOBIN  Date Value Ref Range Status  03/30/2014 9.4* 12.0 - 15.0 g/dL Final   HCT  Date Value Ref Range Status  03/30/2014 28.2* 36.0 - 46.0 % Final   Julia Rangel is a 28 yo female, G2P2002, who presented to [facility] hospital for repeat C-S. She delivered a baby girl by vacuum assisted LTCS with no complications. She is doing well post-operatively.   Physical Exam:  General: alert, cooperative, appears stated age and no distress Lochia: appropriate Uterine Fundus: firm Incision: healing well DVT Evaluation: No evidence of DVT seen on physical exam. Negative Homan's sign.  Discharge Diagnoses: Term Pregnancy-delivered  Discharge Information: Date: 03/31/2014 Activity: pelvic rest Diet: routine Medications: Ibuprofen Condition: stable Instructions: refer to practice specific booklet Discharge to: home   Newborn Data: Live born female  Birth Weight: 8 lb 1.4 oz (3668 g) APGAR: 8, 10  Home with mother.  Gevena Barre, Student-PA   03/31/2014, 9:15 AM

## 2014-03-31 NOTE — Lactation Note (Signed)
This note was copied from the chart of Julia Rangel. Lactation Consultation Note  Patient Name: Julia Prisilla Kocsis [id number] Date: 03/31/2014 Reason for consult: Follow-up assessment;Other (Comment) (re sized a Nipple shield see LC note )  Baby is 72-45 hours old and the Pedis MD has been in to discharge baby and Follow up for this Saturday. Baby latched on the left breast with a #20 Nipple shield and mom using a curved tip syringe. LC recommended being d/c with a  SNS to help the baby be more consistent at the breast until the milk comes in. [place] instructed both mom and dad how to use the SNS on to of the NS. And cleaning. LC resized mom for the NS , and noted the  #20 NS fits well , but the nipples appear pinky red and some bruising. #24 NS accomodates the areola more. Baby woke up and LC assisted  With latching on the right breast , football, and baby able to latch well with #24 NS , with formula instilled in the top. Multiply swallows noted  And colostrum noted in the base of the NS and easily expressed after the NS taken off. LC reviewed sore nipple and engorgement prevention and tx. LC instructed mom on the use shells and comfort gels. LC reviewed the [facility] plan with the use os NS , SNS , and extra pumping. Mom expressed understanding. Mom and dad willing to come in for [facility] O/P apt Tuesday Dec. 1st at [place] reminder sheet given to mom.  Mom has a DEBP Medela .    Maternal Data Has patient been taught Hand Expression?: Yes  Feeding Feeding Type: Breast Fed (used #24 NS ) Length of feed: 10 min  LATCH Score/Interventions Latch: Grasps breast easily, tongue down, lips flanged, rhythmical sucking. (#24 Nipple shield ) Intervention(s): Skin to skin;Teach feeding cues;Waking techniques Intervention(s): Adjust position;Assist with latch;Breast massage;Breast compression  Audible Swallowing: Spontaneous and intermittent  Type of Nipple: Everted at rest and after stimulation  Comfort  (Breast/Nipple): Soft / non-tender     Hold (Positioning): Assistance needed to correctly position infant at breast and maintain latch. Intervention(s): Breastfeeding basics reviewed;Position options;Support Pillows;Skin to skin  LATCH Score: 9  Lactation Tools Discussed/Used Tools: Shells;Nipple Shields;Pump;Supplemental Nutrition System;Comfort gels Nipple shield size: 24;20 Shell Type: Inverted Breast pump type:  (mom has her won DEBP Medela at home )   Consult Status Consult Status: Follow-up Date: 04/06/14 (at 1pm ) Follow-up type: Out-patient    Myer Haff 03/31/2014, 11:33 AM

## 2014-03-31 NOTE — Plan of Care (Signed)
Problem: Discharge Progression Outcomes Goal: Activity appropriate for discharge plan Outcome: Completed/Met Date Met:  03/31/14 Goal: Pain controlled with appropriate interventions Outcome: Completed/Met Date Met:  03/31/14 Goal: Afebrile, VS remain stable at discharge Outcome: Completed/Met Date Met:  03/31/14 Goal: Remove staples per MD order Outcome: Not Applicable Date Met:  73/95/84 Goal: Discharge plan in place and appropriate Outcome: Completed/Met Date Met:  03/31/14

## 2014-03-31 NOTE — Discharge Instructions (Signed)

## 2014-04-05 ENCOUNTER — Ambulatory Visit (INDEPENDENT_AMBULATORY_CARE_PROVIDER_SITE_OTHER): Payer: BC Managed Care – PPO | Admitting: Obstetrics & Gynecology

## 2014-04-05 ENCOUNTER — Encounter: Payer: Self-pay | Admitting: Obstetrics & Gynecology

## 2014-04-05 VITALS — BP 100/60 | Wt 213.0 lb

## 2014-04-05 DIAGNOSIS — Z9889 Other specified postprocedural states: Secondary | ICD-10-CM

## 2014-04-05 MED ORDER — NORETHINDRONE 0.35 MG PO TABS: 1.0000 | ORAL_TABLET | Freq: Every day | ORAL | Status: DC

## 2014-04-05 NOTE — Progress Notes (Signed)
Patient ID: Julia Rangel, female   DOB: Jan 24, 1986, 28 y.o.   MRN: [id number] Blood pressure 100/60, weight 213 lb (96.616 kg), unknown if currently breastfeeding.  Incision clean dry intact  No complaints  Fu 2 weeks nexplanon

## 2014-04-05 NOTE — Addendum Note (Signed)
Addended by: Florian Buff on: 04/05/2014 11:56 AM   Modules accepted: Orders, Level of Service

## 2014-04-06 ENCOUNTER — Ambulatory Visit (HOSPITAL_COMMUNITY)
Admission: RE | Admit: 2014-04-06 | Discharge: 2014-04-06 | Disposition: A | Discharge status: Home / Self Care | Payer: BC Managed Care – PPO | Source: Ambulatory Visit | Attending: Obstetrics & Gynecology | Admitting: Obstetrics & Gynecology

## 2014-05-03 ENCOUNTER — Ambulatory Visit (INDEPENDENT_AMBULATORY_CARE_PROVIDER_SITE_OTHER): Payer: BC Managed Care – PPO | Admitting: Women's Health

## 2014-05-03 ENCOUNTER — Encounter: Payer: Self-pay | Admitting: Women's Health

## 2014-05-03 DIAGNOSIS — Z3009 Encounter for other general counseling and advice on contraception: Secondary | ICD-10-CM

## 2014-05-03 MED ORDER — FLUOXETINE HCL 20 MG PO CAPS: 20.0000 mg | ORAL_CAPSULE | Freq: Every day | ORAL | Status: DC

## 2014-05-03 NOTE — Progress Notes (Signed)
Patient ID: Julia Rangel, female   DOB: 10/19/1985, 28 y.o.   MRN: [id number] Subjective:    Julia Rangel is a 28 y.o. G79P2002 Caucasian female who presents for a postpartum visit. She is 4 weeks postpartum following a repeat cesarean section, low transverse incision at 39.0 gestational weeks. Anesthesia: spinal. I have fully reviewed the prenatal and intrapartum course. Postpartum course has been uncomplicated.  She has been on micronor since 1wk s/p c/s, forgets pills, doesn't take at same time daily consistently. Was told she could rely on BF as backup, but she is supplementing w/ formula and only pumpin TID.  Baby's course has been uncomplicated. Baby feeding by Breast/Bottle. Bleeding no bleeding. Bowel function is normal. Bladder function is normal. Patient is sexually active.  Contraception method is oral progesterone-only contraceptive. Postpartum depression screening: negative. Score 4.  Last pap 05/27/12 and was neg. Does have h/o abnormal paps and LEEP procedure. Requests refill on prozac, states ped is aware she is on it and BF, no adverse effects on infant. Discussed that it is excreted into breastmilk and not recommended by manufacturer, but pt wishes to continue.   The following portions of the patient's history were reviewed and updated as appropriate: allergies, current medications, past medical history, past surgical history and problem list.  Review of Systems Pertinent items are noted in HPI.   Filed Vitals:   05/03/14 1023  BP: 90/60  Weight: 201 lb (91.173 kg)   No LMP recorded.  Objective:   General:  alert, cooperative and no distress   Breasts:  deferred, no complaints  Lungs: clear to auscultation bilaterally  Heart:  regular rate and rhythm  Abdomen: soft, nontender, incision well-healed   Vulva: normal  Vagina: normal vagina  Cervix:  closed  Corpus: Well-involuted  Adnexa:  Non-palpable  Rectal Exam: No hemorrhoids        Assessment:   Postpartum exam 4  wks s/p RLTCS Pumping and bottlefeeding Depression screening Contraception counseling   Plan:  Refilled prozac per request Contraception: oral progesterone-only contraceptive, to set alarm to take at same time daily, do not rely on breastfeeding as back-up method as she is also supplementing and only pumping TID. Discussed other progestin-only methods, none of which she wants at this time.  Follow up in: after 05/27/14 for pap & physical or earlier if needed  Julia Rangel CNM, [facility] 05/03/2014 10:33 AM

## 2014-05-12 ENCOUNTER — Other Ambulatory Visit: Payer: Self-pay | Admitting: Women's Health

## 2014-05-12 ENCOUNTER — Encounter: Payer: Self-pay | Admitting: Women's Health

## 2014-05-12 MED ORDER — NORETHIN ACE-ETH ESTRAD-FE 1-20 MG-MCG(24) PO CHEW: 1.0000 | CHEWABLE_TABLET | Freq: Every day | ORAL | Status: DC

## 2014-06-01 ENCOUNTER — Ambulatory Visit (INDEPENDENT_AMBULATORY_CARE_PROVIDER_SITE_OTHER): Payer: BC Managed Care – PPO | Admitting: Women's Health

## 2014-06-01 ENCOUNTER — Encounter: Payer: Self-pay | Admitting: Women's Health

## 2014-06-01 VITALS — BP 118/68 | Ht 65.0 in | Wt 201.0 lb

## 2014-06-01 DIAGNOSIS — Z01419 Encounter for gynecological examination (general) (routine) without abnormal findings: Secondary | ICD-10-CM

## 2014-06-01 DIAGNOSIS — Z1389 Encounter for screening for other disorder: Secondary | ICD-10-CM

## 2014-06-01 LAB — POCT URINALYSIS DIPSTICK
Blood, UA: NEGATIVE
GLUCOSE UA: NEGATIVE
KETONES UA: NEGATIVE
Leukocytes, UA: NEGATIVE
NITRITE UA: NEGATIVE
PROTEIN UA: NEGATIVE

## 2014-06-01 NOTE — Progress Notes (Signed)
Patient ID: Julia Rangel, female   DOB: 22-Feb-1986, 29 y.o.   MRN: [id number] Subjective:   Julia Rangel is a 29 y.o. G12P2002 Caucasian female here for a routine well-woman exam.  No LMP recorded.    Current complaints: still has weird cramping sensation in incision when voiding, no uti s/s. Doing well on minastrin, still on 1st pack, hasn't gotten to placebo week to have period yet.  PCP: none       Doesn't desire labs, is 9wks s/p C/S- had labs during pregnancy and normal tsh at beginning of pregnancy  Social History: Sexual: heterosexual Marital Status: married Living situation: with spouse Occupation: [employer], Psychologist, prison and probation services Tobacco/alcohol: no tobacco, etoh occ Illicit drugs: no history of illicit drug use  The following portions of the patient's history were reviewed and updated as appropriate: allergies, current medications, past family history, past medical history, past social history, past surgical history and problem list.  Past Medical History Past Medical History  Diagnosis Date  . Migraines     since age 58 per patient  . 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 Dysplasia of cervix   . Anemia   . Menorrhagia   . Patient desires pregnancy 06/01/2013  . PONV (postoperative nausea and vomiting)   . PMDD (premenstrual dysphoric disorder)     PROZAC 20 MG BUT NONE WITH PREGNANCY  . Kidney stone     Past Surgical History Past Surgical History  Procedure Laterality Date  . Leep  2008  . Kidney stone surgery  2010    stent  . Cesarean section  2010  . Phylloid removed  2009    left breast  . Tonsillectomy    . Cesarean section N/A 03/29/2014    Procedure: REPEAT CESAREAN SECTION;  Surgeon: Florian Buff, MD;  Location: [facility];  Service: Obstetrics;  Laterality: N/A;    Gynecologic History [id number]  No LMP recorded. Contraception: OCP (estrogen/progesterone) Last Pap: 05/27/12. Results were: normal Last mammogram: never. Results  were: n/a Last TCS: never  Obstetric History OB History  Gravida Para Term Preterm AB SAB TAB Ectopic Multiple Living  2 2 2       0 2    # Outcome Date GA Lbr Len/2nd Weight Sex Delivery Anes PTL Lv  2 Term 03/29/14 [redacted]w[redacted]d  8 lb 1.4 oz (3.668 kg) F CS-LTranv Spinal  Y  1 Term 11/07/08 [redacted]w[redacted]d  8 lb 3 oz (3.714 kg) F CS-LTranv EPI  Y     Comments: for FTP @ 9cm      Current Medications Current Outpatient Prescriptions on File Prior to Visit  Medication Sig Dispense Refill  . FLUoxetine (PROZAC) 20 MG capsule Take 1 capsule (20 mg total) by mouth daily. 30 capsule 6  . Norethin Ace-Eth Estrad-FE (MINASTRIN 24 FE) 1-20 MG-MCG(24) CHEW Chew 1 tablet by mouth daily. 28 tablet 11  . butalbital-acetaminophen-caffeine (FIORICET, ESGIC) 50-325-40 MG per tablet TAKE 1 TABLET EVERY 6 HOURS AS NEEDED FOR HEADACHE. (Patient not taking: Reported on 03/15/2014) 20 tablet 0  . Doxylamine-Pyridoxine (DICLEGIS) 10-10 MG TBEC Take 10 mg by mouth See admin instructions. (Patient not taking: Reported on 03/15/2014) 100 tablet 4  . ibuprofen (ADVIL,MOTRIN) 600 MG tablet Take 1 tablet (600 mg total) by mouth every 4 (four) hours as needed for mild pain. (Patient not taking: Reported on 04/05/2014) 30 tablet 0  . metroNIDAZOLE (FLAGYL) 500 MG tablet Take 1 tablet (500 mg total) by  mouth 2 (two) times daily. X 7 days (Patient not taking: Reported on 03/15/2014) 14 tablet 0  . ondansetron (ZOFRAN ODT) 4 MG disintegrating tablet Take 1 tablet (4 mg total) by mouth every 6 (six) hours as needed for nausea. (Patient not taking: Reported on 03/15/2014) 30 tablet 2  . oxyCODONE-acetaminophen (PERCOCET/ROXICET) 5-325 MG per tablet Take 1 tablet by mouth every 4 (four) hours as needed for moderate pain or severe pain. (Patient not taking: Reported on 04/05/2014) 50 tablet 0  . Prenatal Vit-Fe Sulfate-FA (PRENATAL VITAMIN PO) Take by mouth daily.     No current facility-administered medications on file prior to visit.     Review of Systems Patient denies any headaches, blurred vision, shortness of breath, chest pain, abdominal pain, problems with bowel movements, urination, or intercourse.  Objective:  BP 118/68 mmHg  Ht 5\' 5"  (1.651 m)  Wt 201 lb (91.173 kg)  BMI 33.45 kg/m2  Breastfeeding? No Physical Exam  General:  Well developed, well nourished, no acute distress. She is alert and oriented x3. Skin:  Warm and dry Neck:  Midline trachea, no thyromegaly or nodules Cardiovascular: Regular rate and rhythm, no murmur heard Lungs:  Effort normal, all lung fields clear to auscultation bilaterally Breasts:  No dominant palpable mass, retraction, or nipple discharge Abdomen:  Soft, non tender, no hepatosplenomegaly or masses. C/S incision well-healed- no pain w/ palpation Pelvic:  External genitalia is normal in appearance.  The vagina is normal in appearance. The cervix is bulbous, no CMT.  Thin prep pap is done w/ HR HPV cotesting. Uterus is felt to be normal size, shape, and contour.  No adnexal masses or tenderness noted. Extremities:  No swelling or varicosities noted Psych:  She has a normal mood and affect  Assessment:   Healthy well-woman exam  Plan:  Continue minastrin F/U 72yr for physical, or sooner if needed Mammogram @30yo  or sooner if problems Colonoscopy @30yo  or sooner if problems Will dipstick urine d/t cramping w/ urinating (urine dipstick neg)  Tawnya Crook CNM, [facility] 06/01/2014 4:53 PM

## 2014-06-02 ENCOUNTER — Other Ambulatory Visit (HOSPITAL_COMMUNITY)
Admission: RE | Admit: 2014-06-02 | Discharge: 2014-06-02 | Disposition: A | Discharge status: Home / Self Care | Payer: BC Managed Care – PPO | Source: Ambulatory Visit | Attending: Obstetrics & Gynecology | Admitting: Obstetrics & Gynecology

## 2014-06-02 DIAGNOSIS — Z01419 Encounter for gynecological examination (general) (routine) without abnormal findings: Secondary | ICD-10-CM | POA: Diagnosis present

## 2014-06-02 DIAGNOSIS — Z1151 Encounter for screening for human papillomavirus (HPV): Secondary | ICD-10-CM | POA: Insufficient documentation

## 2014-06-02 NOTE — Addendum Note (Signed)
Addended by: Traci Sermon A on: 06/02/2014 09:34 AM   Modules accepted: Orders

## 2014-06-03 LAB — CYTOLOGY - PAP

## 2014-12-15 ENCOUNTER — Other Ambulatory Visit: Payer: Self-pay | Admitting: Women's Health

## 2015-02-02 DIAGNOSIS — E663 Overweight: Secondary | ICD-10-CM | POA: Insufficient documentation

## 2015-02-02 DIAGNOSIS — E669 Obesity, unspecified: Secondary | ICD-10-CM | POA: Insufficient documentation

## 2015-02-02 DIAGNOSIS — G43909 Migraine, unspecified, not intractable, without status migrainosus: Secondary | ICD-10-CM | POA: Insufficient documentation

## 2015-03-23 ENCOUNTER — Other Ambulatory Visit: Payer: Self-pay | Admitting: Women's Health

## 2015-04-23 ENCOUNTER — Other Ambulatory Visit: Payer: Self-pay | Admitting: Women's Health

## 2015-05-23 ENCOUNTER — Other Ambulatory Visit: Payer: Self-pay | Admitting: Women's Health

## 2015-06-14 ENCOUNTER — Encounter: Payer: Self-pay | Admitting: Women's Health

## 2015-06-14 ENCOUNTER — Other Ambulatory Visit: Payer: BC Managed Care – PPO | Admitting: Women's Health

## 2015-06-20 ENCOUNTER — Other Ambulatory Visit: Payer: Self-pay | Admitting: Women's Health

## 2015-07-07 ENCOUNTER — Other Ambulatory Visit: Payer: Self-pay | Admitting: Women's Health

## 2015-07-08 ENCOUNTER — Other Ambulatory Visit: Payer: Self-pay | Admitting: Adult Health

## 2015-07-18 ENCOUNTER — Other Ambulatory Visit: Payer: Self-pay | Admitting: Women's Health

## 2015-07-28 ENCOUNTER — Other Ambulatory Visit (HOSPITAL_COMMUNITY)
Admission: RE | Admit: 2015-07-28 | Discharge: 2015-07-28 | Disposition: A | Discharge status: Home / Self Care | Payer: BC Managed Care – PPO | Source: Ambulatory Visit | Attending: Advanced Practice Midwife | Admitting: Advanced Practice Midwife

## 2015-07-28 ENCOUNTER — Encounter: Payer: Self-pay | Admitting: Advanced Practice Midwife

## 2015-07-28 ENCOUNTER — Ambulatory Visit (INDEPENDENT_AMBULATORY_CARE_PROVIDER_SITE_OTHER): Payer: BC Managed Care – PPO | Admitting: Advanced Practice Midwife

## 2015-07-28 VITALS — BP 118/82 | HR 74 | Ht 66.0 in | Wt 186.0 lb

## 2015-07-28 DIAGNOSIS — Z01419 Encounter for gynecological examination (general) (routine) without abnormal findings: Secondary | ICD-10-CM | POA: Diagnosis present

## 2015-07-28 DIAGNOSIS — Z13220 Encounter for screening for lipoid disorders: Secondary | ICD-10-CM

## 2015-07-28 DIAGNOSIS — Z1329 Encounter for screening for other suspected endocrine disorder: Secondary | ICD-10-CM

## 2015-07-28 DIAGNOSIS — Z1151 Encounter for screening for human papillomavirus (HPV): Secondary | ICD-10-CM | POA: Diagnosis present

## 2015-07-28 MED ORDER — FLUOXETINE HCL 20 MG PO TABS: 20.0000 mg | ORAL_TABLET | Freq: Every day | ORAL | Status: DC

## 2015-07-28 MED ORDER — NORETHIN ACE-ETH ESTRAD-FE 1-20 MG-MCG(24) PO CHEW: 1.0000 | CHEWABLE_TABLET | Freq: Every day | ORAL | Status: DC

## 2015-07-28 NOTE — Progress Notes (Signed)
Julia Rangel 30 y.o.  Filed Vitals:   07/28/15 1103  BP: 118/82  Pulse: 74     Filed Weights   07/28/15 1103  Weight: 186 lb (84.369 kg)    Past Medical History: Past Medical History  Diagnosis Date  . Migraines     since age 66 per patient  . 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 Dysplasia of cervix   . Anemia   . Menorrhagia   . Patient desires pregnancy 06/01/2013  . PONV (postoperative nausea and vomiting)   . PMDD (premenstrual dysphoric disorder)     PROZAC 20 MG BUT NONE WITH PREGNANCY  . Kidney stone     Past Surgical History: Past Surgical History  Procedure Laterality Date  . Leep  2008  . Kidney stone surgery  2010    stent  . Cesarean section  2010  . Phylloid removed  2009    left breast  . Tonsillectomy    . Cesarean section N/A 03/29/2014    Procedure: REPEAT CESAREAN SECTION;  Surgeon: Florian Buff, MD;  Location: [facility];  Service: Obstetrics;  Laterality: N/A;    Family History: Family History  Problem Relation Age of Onset  . Diabetes Maternal Grandmother   . Cancer Maternal Grandmother     breast  . Diabetes Paternal Grandmother   . Cancer Paternal Grandmother     breast    Social History: Social History  Substance Use Topics  . Smoking status: Never Smoker   . Smokeless tobacco: Never Used  . Alcohol Use: No    Allergies: No Known Allergies    Current outpatient prescriptions:  .  FLUoxetine (PROZAC) 20 MG capsule, TAKE (1) CAPSULE BY MOUTH ONCE DAILY., Disp: 30 capsule, Rfl: 0 .  ibuprofen (ADVIL,MOTRIN) 600 MG tablet, Take 1 tablet (600 mg total) by mouth every 4 (four) hours as needed for mild pain., Disp: 30 tablet, Rfl: 0 .  MINASTRIN 24 FE 1-20 MG-MCG(24) CHEW, CHEW ONE TABLET BY MOUTH ONCE DAILY., Disp: 28 tablet, Rfl: 0 .  phentermine 37.5 MG capsule, Take 37.5 mg by mouth every morning., Disp: , Rfl:  .  SUMAtriptan (IMITREX) 100 MG tablet, Take 100 mg by mouth every 2 (two)  hours as needed for migraine. May repeat in 2 hours if headache persists or recurs., Disp: , Rfl:   History of Present Illness: Here for pap smear. Aware that guidelines are q 3 years, but wants them yearly.  Has "severe" PMS 10 days before period--very moody.  Takes daily prozac.  Wants fasting labs   Review of Systems   Patient denies any headaches, blurred vision, shortness of breath, chest pain, abdominal pain, problems with bowel movements, urination, or intercourse.   Physical Exam: General:  Well developed, well nourished, no acute distress Skin:  Warm and dry Neck:  Midline trachea, normal thyroid Lungs; Clear to auscultation bilaterally Breast:  No dominant palpable mass, retraction, or nipple discharge Cardiovascular: Regular rate and rhythm Abdomen:  Soft, non tender, no hepatosplenomegaly Pelvic:  External genitalia is normal in appearance.  The vagina is normal in appearance.  The cervix is bulbous.  Uterus is felt to be normal size, shape, and contour.  No adnexal masses or tenderness noted.  Extremities:  No swelling or varicosities noted Psych:  No mood changes.     Impression: Normal GYN exam.   PMS     Plan: Increase prozac to 40mg /day 12 days before  period.  Fasting labs ordered minastrin refilled

## 2015-07-31 LAB — TSH: TSH: 1.86 u[IU]/mL (ref 0.450–4.500)

## 2015-07-31 LAB — CBC
HEMOGLOBIN: 13.5 g/dL (ref 11.1–15.9)
Hematocrit: 39.8 % (ref 34.0–46.6)
MCH: 30.8 pg (ref 26.6–33.0)
MCHC: 33.9 g/dL (ref 31.5–35.7)
MCV: 91 fL (ref 79–97)
Platelets: 308 10*3/uL (ref 150–379)
RBC: 4.39 x10E6/uL (ref 3.77–5.28)
RDW: 12.3 % (ref 12.3–15.4)
WBC: 5 10*3/uL (ref 3.4–10.8)

## 2015-07-31 LAB — COMPREHENSIVE METABOLIC PANEL
A/G RATIO: 1.5 (ref 1.2–2.2)
ALBUMIN: 4.1 g/dL (ref 3.5–5.5)
ALT: 17 IU/L (ref 0–32)
AST: 16 IU/L (ref 0–40)
Alkaline Phosphatase: 52 IU/L (ref 39–117)
BUN/Creatinine Ratio: 16 (ref 8–20)
BUN: 12 mg/dL (ref 6–20)
Bilirubin Total: 0.4 mg/dL (ref 0.0–1.2)
CO2: 19 mmol/L (ref 18–29)
Calcium: 9.3 mg/dL (ref 8.7–10.2)
Chloride: 102 mmol/L (ref 96–106)
Creatinine, Ser: 0.75 mg/dL (ref 0.57–1.00)
GFR, EST AFRICAN AMERICAN: 123 mL/min/{1.73_m2} (ref >59)
GFR, EST NON AFRICAN AMERICAN: 107 mL/min/{1.73_m2} (ref >59)
GLOBULIN, TOTAL: 2.8 g/dL (ref 1.5–4.5)
Glucose: 80 mg/dL (ref 65–99)
POTASSIUM: 4.4 mmol/L (ref 3.5–5.2)
SODIUM: 140 mmol/L (ref 134–144)
TOTAL PROTEIN: 6.9 g/dL (ref 6.0–8.5)

## 2015-07-31 LAB — LIPID PANEL
CHOL/HDL RATIO: 2.7 ratio (ref 0.0–4.4)
Cholesterol, Total: 173 mg/dL (ref 100–199)
HDL: 65 mg/dL (ref >39)
LDL CALC: 85 mg/dL (ref 0–99)
Triglycerides: 115 mg/dL (ref 0–149)
VLDL Cholesterol Cal: 23 mg/dL (ref 5–40)

## 2015-08-01 LAB — CYTOLOGY - PAP

## 2015-08-03 ENCOUNTER — Encounter: Payer: Self-pay | Admitting: Advanced Practice Midwife

## 2015-08-04 ENCOUNTER — Other Ambulatory Visit: Payer: Self-pay | Admitting: Advanced Practice Midwife

## 2015-10-06 ENCOUNTER — Other Ambulatory Visit: Payer: Self-pay | Admitting: Advanced Practice Midwife

## 2016-01-10 ENCOUNTER — Encounter: Payer: Self-pay | Admitting: Adult Health

## 2016-01-10 ENCOUNTER — Ambulatory Visit (INDEPENDENT_AMBULATORY_CARE_PROVIDER_SITE_OTHER): Payer: BC Managed Care – PPO | Admitting: Adult Health

## 2016-01-10 VITALS — BP 120/80 | HR 92 | Ht 66.0 in | Wt 186.5 lb

## 2016-01-10 DIAGNOSIS — Z3202 Encounter for pregnancy test, result negative: Secondary | ICD-10-CM | POA: Diagnosis not present

## 2016-01-10 DIAGNOSIS — N926 Irregular menstruation, unspecified: Secondary | ICD-10-CM

## 2016-01-10 DIAGNOSIS — Z319 Encounter for procreative management, unspecified: Secondary | ICD-10-CM | POA: Diagnosis not present

## 2016-01-10 LAB — POCT URINE PREGNANCY: Preg Test, Ur: NEGATIVE

## 2016-01-10 MED ORDER — MEDROXYPROGESTERONE ACETATE 10 MG PO TABS: 10.0000 mg | ORAL_TABLET | Freq: Every day | ORAL | 0 refills | Status: DC

## 2016-01-10 NOTE — Patient Instructions (Signed)
Take provera 10 mg 1 daily for 10 days  Follow up in 4 weeks Call with bleeding

## 2016-01-10 NOTE — Progress Notes (Signed)
Subjective:     Patient ID: Gearldine Bienenstock, female   DOB: Apr 14, 1986, 30 y.o.   MRN: [id number]  HPI Annalynne is a 30 year old white female in complaining of no period since February and wants to be pregnant now.   Review of Systems + missed periods Reviewed past medical,surgical, social and family history. Reviewed medications and allergies.     Objective:   Physical Exam BP 120/80 (BP Location: Left Arm, Patient Position: Sitting, Cuff Size: Normal)   Pulse 92   Ht 5\' 6"  (1.676 m)   Wt 186 lb 8 oz (84.6 kg)   Breastfeeding? No   BMI 30.10 kg/m  UPT negative, talk only, will try provera to start a period and then will talk timing of intercourse and maybe clomid if needed.Discussed with Dr Elonda Husky.    Assessment:    Missed periods  Patient desires pregnancy  Pregnancy examination or test, negative result - Plan: POCT urine pregnancy     Plan:     Rx provera 10 mg #10 take 1 daily for 10 days Continue folic acid Call with bleeding  Follow up in 4 weeks

## 2016-01-24 ENCOUNTER — Telehealth: Payer: Self-pay | Admitting: Adult Health

## 2016-01-24 NOTE — Telephone Encounter (Signed)
Left message x 1. JSY 

## 2016-01-24 NOTE — Telephone Encounter (Signed)
Spoke with pt. Pt started her period today. Was supposed to let [place] know. [place]

## 2016-01-24 NOTE — Telephone Encounter (Signed)
Pt called stating that Julia Rangel told her to give a call when she started. This was all the information pt gave. Please contact pt

## 2016-01-25 ENCOUNTER — Telehealth: Payer: Self-pay | Admitting: Adult Health

## 2016-01-25 DIAGNOSIS — Z319 Encounter for procreative management, unspecified: Secondary | ICD-10-CM

## 2016-01-25 NOTE — Telephone Encounter (Signed)
She started period yesterday, Have sex every other day, days 7-24 check progesterone level 10/9.

## 2016-01-25 NOTE — Telephone Encounter (Signed)
Let message to call me back.

## 2016-01-25 NOTE — Telephone Encounter (Signed)
Left message I  Called, call me back  

## 2016-02-02 ENCOUNTER — Encounter: Payer: Self-pay | Admitting: Adult Health

## 2016-02-07 ENCOUNTER — Ambulatory Visit: Payer: BC Managed Care – PPO | Admitting: Adult Health

## 2016-02-14 ENCOUNTER — Encounter: Payer: Self-pay | Admitting: Adult Health

## 2016-02-14 ENCOUNTER — Telehealth: Payer: Self-pay | Admitting: Adult Health

## 2016-02-14 LAB — PROGESTERONE: Progesterone: <0.1 ng/mL

## 2016-02-14 NOTE — Telephone Encounter (Signed)
Had +ovulation test at home but progesterone level ,0.1, call me next week with period or not and will start clomid

## 2016-02-14 NOTE — Telephone Encounter (Signed)
Pt informed of Anderson Malta Griffin,NP recommendation and verbalized understanding.

## 2016-02-14 NOTE — Telephone Encounter (Signed)
Pt called stating that she needs to speak with Anderson Malta, Pt states that it is very important. Please contact pt

## 2016-02-17 ENCOUNTER — Encounter: Payer: Self-pay | Admitting: Adult Health

## 2016-02-22 ENCOUNTER — Encounter: Payer: Self-pay | Admitting: Adult Health

## 2016-02-22 ENCOUNTER — Other Ambulatory Visit (INDEPENDENT_AMBULATORY_CARE_PROVIDER_SITE_OTHER): Payer: BC Managed Care – PPO

## 2016-02-22 ENCOUNTER — Other Ambulatory Visit: Payer: Self-pay | Admitting: Adult Health

## 2016-02-22 ENCOUNTER — Ambulatory Visit (INDEPENDENT_AMBULATORY_CARE_PROVIDER_SITE_OTHER): Payer: BC Managed Care – PPO | Admitting: Adult Health

## 2016-02-22 VITALS — BP 100/62 | HR 86 | Ht 67.0 in | Wt 193.5 lb

## 2016-02-22 DIAGNOSIS — N854 Malposition of uterus: Secondary | ICD-10-CM

## 2016-02-22 DIAGNOSIS — N83201 Unspecified ovarian cyst, right side: Secondary | ICD-10-CM

## 2016-02-22 DIAGNOSIS — Z319 Encounter for procreative management, unspecified: Secondary | ICD-10-CM

## 2016-02-22 DIAGNOSIS — N888 Other specified noninflammatory disorders of cervix uteri: Secondary | ICD-10-CM

## 2016-02-22 DIAGNOSIS — N970 Female infertility associated with anovulation: Secondary | ICD-10-CM

## 2016-02-22 MED ORDER — CLOMIPHENE CITRATE 50 MG PO TABS: 50.0000 mg | ORAL_TABLET | Freq: Every day | ORAL | 2 refills | Status: DC

## 2016-02-22 NOTE — Patient Instructions (Signed)
Take clomid starting today Sex every other day  Progesterone level 11/6

## 2016-02-22 NOTE — Progress Notes (Signed)
PELVIC US TV: Homogeneous anteverted uterus wnl,EEC 15 mm,mult.simple nabothian cysts,normal lt ov,simple right ov cyst 5.2 x 2.6 x 3 cm,no free fluid seen,no pain during ultrasound,ov's appear mobile.

## 2016-02-22 NOTE — Progress Notes (Signed)
Subjective:     Patient ID: Julia Rangel, female   DOB: 02-04-1986, 30 y.o.   MRN: [id number]  HPI Julia Rangel is a 30 year old white female, in for US,had some pain, and wants to be pregnant.She was seen 9/5 and had not had a period since February and was given provera 10 mg for 10 days and had a [period 9/19 and had +ovulation detector kits, but when had progesterone level drawn it was<0.1.  Review of Systems + desire pregnancy  Reviewed past medical,surgical, social and family history. Reviewed medications and allergies.     Objective:   Physical Exam BP 100/62 (BP Location: Left Arm, Patient Position: Sitting, Cuff Size: Normal)   Pulse 86   Ht 5\' 7"  (1.702 m)   Wt 193 lb 8 oz (87.8 kg)   LMP 01/24/2016 (Approximate)   Breastfeeding? No   BMI 30.31 kg/m  PHQ 2 score 0. US shows 5.2 x 3.0 x 2.6 cm right ovarian cyst,and endometrium 15 mm, discussed with Dr Glo Herring and he says fine to start clomid now.She is aware about 7% chance of twins.     Assessment:     1. Cyst of right ovary   2. Patient desires pregnancy       Plan:    Start clomid today Meds ordered this encounter  Medications  . Pediatric Multiple Vit-C-FA (FLINSTONES GUMMIES OMEGA-3 DHA PO)    Sig: Take by mouth. Takes 2 daily  . clomiPHENE (CLOMID) 50 MG tablet    Sig: Take 1 tablet (50 mg total) by mouth daily.    Dispense:  5 tablet    Refill:  2    Order Specific Question:   Supervising Provider    Answer:   Florian Buff [[id number]]  Check progesterone level 11/6 Have sex every other day

## 2016-02-27 ENCOUNTER — Encounter: Payer: Self-pay | Admitting: Adult Health

## 2016-02-29 ENCOUNTER — Telehealth: Payer: Self-pay | Admitting: *Deleted

## 2016-02-29 NOTE — Telephone Encounter (Signed)
Spoke with pt letting her know insurance has denied covering Clomid. Med is not expensive and pt got med last week and paid for it out of pocket. Pt states it was $25.00. Pt voiced understanding. [place]

## 2016-03-06 ENCOUNTER — Encounter: Payer: Self-pay | Admitting: Adult Health

## 2016-03-13 ENCOUNTER — Encounter: Payer: Self-pay | Admitting: Adult Health

## 2016-03-13 LAB — PROGESTERONE: Progesterone: 17.3 ng/mL

## 2016-04-17 ENCOUNTER — Encounter: Payer: Self-pay | Admitting: Adult Health

## 2016-04-17 ENCOUNTER — Other Ambulatory Visit: Payer: Self-pay | Admitting: Adult Health

## 2016-04-17 DIAGNOSIS — Z319 Encounter for procreative management, unspecified: Secondary | ICD-10-CM

## 2016-04-19 ENCOUNTER — Encounter: Payer: Self-pay | Admitting: Adult Health

## 2016-04-19 LAB — PROGESTERONE: PROGESTERONE: 52.2 ng/mL

## 2016-05-25 ENCOUNTER — Other Ambulatory Visit: Payer: Self-pay | Admitting: Adult Health

## 2016-05-28 ENCOUNTER — Encounter: Payer: Self-pay | Admitting: Adult Health

## 2016-08-30 ENCOUNTER — Encounter: Payer: Self-pay | Admitting: Adult Health

## 2016-08-30 ENCOUNTER — Ambulatory Visit (INDEPENDENT_AMBULATORY_CARE_PROVIDER_SITE_OTHER): Payer: BC Managed Care – PPO | Admitting: Adult Health

## 2016-08-30 VITALS — BP 118/74 | HR 80 | Ht 66.0 in | Wt 198.0 lb

## 2016-08-30 DIAGNOSIS — R110 Nausea: Secondary | ICD-10-CM

## 2016-08-30 DIAGNOSIS — Z3490 Encounter for supervision of normal pregnancy, unspecified, unspecified trimester: Secondary | ICD-10-CM

## 2016-08-30 DIAGNOSIS — N926 Irregular menstruation, unspecified: Secondary | ICD-10-CM

## 2016-08-30 DIAGNOSIS — Z3201 Encounter for pregnancy test, result positive: Secondary | ICD-10-CM | POA: Diagnosis not present

## 2016-08-30 DIAGNOSIS — O3680X0 Pregnancy with inconclusive fetal viability, not applicable or unspecified: Secondary | ICD-10-CM | POA: Diagnosis not present

## 2016-08-30 DIAGNOSIS — N912 Amenorrhea, unspecified: Secondary | ICD-10-CM | POA: Diagnosis not present

## 2016-08-30 LAB — POCT URINE PREGNANCY: Preg Test, Ur: POSITIVE — AB

## 2016-08-30 MED ORDER — ONDANSETRON HCL 4 MG PO TABS: 4.0000 mg | ORAL_TABLET | Freq: Three times a day (TID) | ORAL | 1 refills | Status: DC | PRN

## 2016-08-30 NOTE — Progress Notes (Signed)
Subjective:     Patient ID: Julia Rangel, female   DOB: Jul 29, 1985, 31 y.o.   MRN: [id number]  HPI Julia Rangel is a 31 year old white female, married in for UPT, has had 3+HPT this week, has maybe just missed a period, had GI bug and still has nausea.She took clomid for 6 cycles, but not this last period.   Review of Systems +missed period, with +HPT +nausea Reviewed past medical,surgical, social and family history. Reviewed medications and allergies.     Objective:   Physical Exam BP 118/74 (BP Location: Left Arm, Patient Position: Sitting, Cuff Size: Normal)   Pulse 80   Ht 5\' 6"  (1.676 m)   Wt 198 lb (89.8 kg)   LMP 08/08/2016   BMI 31.96 kg/m UPT +, about 3 weeks by LMP, with EDD 05/15/17. Skin warm and dry. Neck: mid line trachea, normal thyroid, good ROM, no lymphadenopathy noted. Lungs: clear to ausculation bilaterally. Cardiovascular: regular rate and rhythm.Abdomen is soft and non tender.   She requests zofran for nausea. Will get [facility] and progesterone today and [place] in 2 weeks.  Assessment:     1. Pregnancy examination or test, positive result   2. Pregnancy, unspecified gestational age   25. Encounter to determine fetal viability of pregnancy, single or unspecified fetus   4. Missed period   5. Nausea       Plan:     Check QHCG and progesterone Meds ordered this encounter  Medications  . Prenatal MV & Min w/FA-DHA (PRENATAL ADULT GUMMY/DHA/FA PO)    Sig: Take by mouth. Takes 2 daily  . ondansetron (ZOFRAN) 4 MG tablet    Sig: Take 1 tablet (4 mg total) by mouth every 8 (eight) hours as needed for nausea or vomiting.    Dispense:  20 tablet    Refill:  1    Order Specific Question:   Supervising Provider    Answer:   Tania Ade H [[id number]]  Return in 2 weeks for dating [place]

## 2016-08-31 LAB — BETA HCG QUANT (REF LAB): HCG QUANT: 20 m[IU]/mL

## 2016-08-31 LAB — PROGESTERONE: Progesterone: 78.2 ng/mL

## 2016-09-03 ENCOUNTER — Telehealth: Payer: Self-pay | Admitting: Adult Health

## 2016-09-03 ENCOUNTER — Other Ambulatory Visit: Payer: BC Managed Care – PPO

## 2016-09-03 DIAGNOSIS — Z3A01 Less than 8 weeks gestation of pregnancy: Secondary | ICD-10-CM

## 2016-09-03 NOTE — Telephone Encounter (Signed)
Informed patient of Hcg. Per Anderson Malta needs repeat. Pt will come today.

## 2016-09-03 NOTE — Telephone Encounter (Signed)
Pt called stating that she would like to know the results f her blood work. Please contact pt

## 2016-09-04 LAB — BETA HCG QUANT (REF LAB): hCG Quant: 137 m[IU]/mL

## 2016-09-11 ENCOUNTER — Other Ambulatory Visit: Payer: Self-pay | Admitting: Obstetrics & Gynecology

## 2016-09-11 DIAGNOSIS — O3680X0 Pregnancy with inconclusive fetal viability, not applicable or unspecified: Secondary | ICD-10-CM

## 2016-09-13 ENCOUNTER — Ambulatory Visit (INDEPENDENT_AMBULATORY_CARE_PROVIDER_SITE_OTHER): Payer: BC Managed Care – PPO

## 2016-09-13 ENCOUNTER — Other Ambulatory Visit: Payer: BC Managed Care – PPO

## 2016-09-13 DIAGNOSIS — O3680X0 Pregnancy with inconclusive fetal viability, not applicable or unspecified: Secondary | ICD-10-CM

## 2016-09-13 NOTE — Progress Notes (Signed)
[place] 5+5 wks GS w/ys,no fetal pole seen,two simple left ovarian cysts(#1) 3.7 x 3.5 x 3.6 cm,(#2) 3 x 3.2 x 1.9 cm,two simple right ovarian cysts (#1) 3 x 2.8 x 2.8 cm,(#2) 2.9 x 2.2 x 2.6 cm, labs today and f/u ultrasound in 10 days per Anderson Malta

## 2016-09-14 LAB — SPECIMEN STATUS REPORT

## 2016-09-14 LAB — HCG, SERUM, QUALITATIVE: HCG, BETA SUBUNIT, QUAL, SERUM: POSITIVE m[IU]/mL — AB (ref <6)

## 2016-09-15 LAB — BETA HCG QUANT (REF LAB): hCG Quant: 5293 m[IU]/mL

## 2016-09-15 LAB — SPECIMEN STATUS REPORT

## 2016-09-19 ENCOUNTER — Encounter: Payer: Self-pay | Admitting: Adult Health

## 2016-09-21 ENCOUNTER — Other Ambulatory Visit: Payer: Self-pay | Admitting: Obstetrics & Gynecology

## 2016-09-21 DIAGNOSIS — O3680X0 Pregnancy with inconclusive fetal viability, not applicable or unspecified: Secondary | ICD-10-CM

## 2016-09-24 ENCOUNTER — Ambulatory Visit (INDEPENDENT_AMBULATORY_CARE_PROVIDER_SITE_OTHER): Payer: BC Managed Care – PPO

## 2016-09-24 DIAGNOSIS — O3680X0 Pregnancy with inconclusive fetal viability, not applicable or unspecified: Secondary | ICD-10-CM

## 2016-09-24 NOTE — Progress Notes (Signed)
[place] 6+5 wks,single IUP w/ys,pos fht 138 bpm,crl 8.59 mm, simple left ovarian cyst 4.5 x 4 x 3.7 cm,right ovary has three simple cysts, largest 3.2 x 2.9 x 2.9 cm,EDD 05/15/2017

## 2016-10-09 ENCOUNTER — Encounter: Payer: Self-pay | Admitting: Advanced Practice Midwife

## 2016-10-09 ENCOUNTER — Ambulatory Visit (INDEPENDENT_AMBULATORY_CARE_PROVIDER_SITE_OTHER): Payer: BC Managed Care – PPO | Admitting: Advanced Practice Midwife

## 2016-10-09 ENCOUNTER — Ambulatory Visit: Payer: BC Managed Care – PPO | Admitting: *Deleted

## 2016-10-09 VITALS — BP 112/72 | HR 72 | Wt 209.0 lb

## 2016-10-09 DIAGNOSIS — Z3A08 8 weeks gestation of pregnancy: Secondary | ICD-10-CM

## 2016-10-09 DIAGNOSIS — Z1389 Encounter for screening for other disorder: Secondary | ICD-10-CM

## 2016-10-09 DIAGNOSIS — Z331 Pregnant state, incidental: Secondary | ICD-10-CM

## 2016-10-09 DIAGNOSIS — Z3682 Encounter for antenatal screening for nuchal translucency: Secondary | ICD-10-CM

## 2016-10-09 DIAGNOSIS — Z98891 History of uterine scar from previous surgery: Secondary | ICD-10-CM

## 2016-10-09 DIAGNOSIS — Z3481 Encounter for supervision of other normal pregnancy, first trimester: Secondary | ICD-10-CM

## 2016-10-09 DIAGNOSIS — O0990 Supervision of high risk pregnancy, unspecified, unspecified trimester: Secondary | ICD-10-CM | POA: Insufficient documentation

## 2016-10-09 HISTORY — DX: History of uterine scar from previous surgery: Z98.891

## 2016-10-09 LAB — POCT URINALYSIS DIPSTICK
Blood, UA: NEGATIVE
Glucose, UA: NEGATIVE
Ketones, UA: NEGATIVE
Leukocytes, UA: NEGATIVE
NITRITE UA: NEGATIVE
Protein, UA: NEGATIVE

## 2016-10-09 MED ORDER — DOXYLAMINE-PYRIDOXINE ER 20-20 MG PO TBCR: 20.0000 mg | EXTENDED_RELEASE_TABLET | Freq: Every day | ORAL | 3 refills | Status: DC

## 2016-10-09 NOTE — Progress Notes (Signed)
Subjective:    Azaylea Maves is a N5A2130 [redacted]w[redacted]d being seen today for her first obstetrical visit.  Her obstetrical history is significant for smoker.  Pregnancy history fully reviewed.  Patient reports nausea.  Vitals:   10/09/16 1523  BP: 112/72  Pulse: 72  Weight: 209 lb (94.8 kg)    HISTORY: OB History  Gravida Para Term Preterm AB Living  3 2 2     2   SAB TAB Ectopic Multiple Live Births        0 2    # Outcome Date GA Lbr Len/2nd Weight Sex Delivery Anes PTL Lv  3 Current           2 Term 03/29/14 [redacted]w[redacted]d  8 lb 1.4 oz (3.668 kg) F CS-LTranv Spinal N LIV     Birth Comments: No problems at birth   1 Term 11/07/08 [redacted]w[redacted]d  8 lb 3 oz (3.714 kg) F CS-LTranv EPI N LIV     Complications: Failure to Progress in First Stage     Birth Comments: for FTP @ 9cm     Past Medical History:  Diagnosis Date  . Anemia   . Dysplasia of cervix   . Kidney stone   . Menorrhagia   . Migraines    since age 31 per patient  . Patient desires pregnancy 06/01/2013  . 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 PMDD (premenstrual dysphoric disorder)    PROZAC 20 MG BUT NONE WITH PREGNANCY  . PONV (postoperative nausea and vomiting)   . Vaginal Pap smear, abnormal    Past Surgical History:  Procedure Laterality Date  . CESAREAN SECTION  2010  . CESAREAN SECTION N/A 03/29/2014   Procedure: REPEAT CESAREAN SECTION;  Surgeon: Florian Buff, MD;  Location: [facility];  Service: Obstetrics;  Laterality: N/A;  . KIDNEY STONE SURGERY  2010   stent  . LEEP  2008  . phylloid removed  2009   left breast  . TONSILLECTOMY    . WISDOM TOOTH EXTRACTION     Family History  Problem Relation Age of Onset  . Diabetes Maternal Grandmother   . Cancer Maternal Grandmother        breast  . Diabetes Paternal Grandmother   . Cancer Paternal Grandmother        breast  . COPD Paternal Grandfather      Exam       Pelvic Exam:    Perineum: Normal Perineum   Vulva: normal    Vagina:  normal mucosa, normal discharge, no palpable nodules   Uterus Normal, Gravid, FH: 9     Cervix: normal   Adnexa: Not palpable   Urinary:  urethral meatus normal    System:     Skin: normal coloration and turgor, no rashes    Neurologic: oriented, normal, normal mood   Extremities: normal strength, tone, and muscle mass   HEENT PERRLA   Mouth/Teeth mucous membranes moist, normal dentition   Neck supple and no masses   Cardiovascular: regular rate and rhythm   Respiratory:  appears well, vitals normal, no respiratory distress, acyanotic   Abdomen: soft, non-tender;  FHR: 160US          Assessment:    Pregnancy: [id number] Patient Active Problem List   Diagnosis Date Noted  . Supervision of normal pregnancy 10/09/2016  . History of cesarean delivery 10/09/2016  . Pregnancy 08/30/2016  . Cyst of right ovary 02/22/2016  . S/P  cesarean section 03/29/2014  . Personal history of malignant phylloides tumor of breast 09/19/2012  . Anemia 09/19/2012  . Menorrhagia 09/19/2012  . PMDD (premenstrual dysphoric disorder) 09/19/2012  . Dysplasia of cervix 09/19/2012        Plan:     Initial labs drawn. Continue prenatal vitamins  Bonjesta samples and rx given  Problem list reviewed and updated  Reviewed n/v relief measures and warning s/s to report  Reviewed recommended weight gain based on pre-gravid BMI  Encouraged well-balanced diet Genetic Screening discussed Integrated Screen: requested.  Ultrasound discussed; fetal survey: requested.  Return in about 3 weeks (around 10/30/2016) for LROB, US:NT+1st IT.  CRESENZO-DISHMAN,Gracen Southwell 10/09/2016

## 2016-10-09 NOTE — Patient Instructions (Signed)
 First Trimester of Pregnancy The first trimester of pregnancy is from week 1 until the end of week 12 (months 1 through 3). A week after a sperm fertilizes an egg, the egg will implant on the wall of the uterus. This embryo will begin to develop into a baby. Genes from you and your partner are forming the baby. The female genes determine whether the baby is a boy or a girl. At 6-8 weeks, the eyes and face are formed, and the heartbeat can be seen on ultrasound. At the end of 12 weeks, all the baby's organs are formed.  Now that you are pregnant, you will want to do everything you can to have a healthy baby. Two of the most important things are to get good prenatal care and to follow your health care provider's instructions. Prenatal care is all the medical care you receive before the baby's birth. This care will help prevent, find, and treat any problems during the pregnancy and childbirth. BODY CHANGES Your body goes through many changes during pregnancy. The changes vary from woman to woman.   You may gain or lose a couple of pounds at first.  You may feel sick to your stomach (nauseous) and throw up (vomit). If the vomiting is uncontrollable, call your health care provider.  You may tire easily.  You may develop headaches that can be relieved by medicines approved by your health care provider.  You may urinate more often. Painful urination may mean you have a bladder infection.  You may develop heartburn as a result of your pregnancy.  You may develop constipation because certain hormones are causing the muscles that push waste through your intestines to slow down.  You may develop hemorrhoids or swollen, bulging veins (varicose veins).  Your breasts may begin to grow larger and become tender. Your nipples may stick out more, and the tissue that surrounds them (areola) may become darker.  Your gums may bleed and may be sensitive to brushing and flossing.  Dark spots or blotches  (chloasma, mask of pregnancy) may develop on your face. This will likely fade after the baby is born.  Your menstrual periods will stop.  You may have a loss of appetite.  You may develop cravings for certain kinds of food.  You may have changes in your emotions from day to day, such as being excited to be pregnant or being concerned that something may go wrong with the pregnancy and baby.  You may have more vivid and strange dreams.  You may have changes in your hair. These can include thickening of your hair, rapid growth, and changes in texture. Some women also have hair loss during or after pregnancy, or hair that feels dry or thin. Your hair will most likely return to normal after your baby is born. WHAT TO EXPECT AT YOUR PRENATAL VISITS During a routine prenatal visit:  You will be weighed to make sure you and the baby are growing normally.  Your blood pressure will be taken.  Your abdomen will be measured to track your baby's growth.  The fetal heartbeat will be listened to starting around week 10 or 12 of your pregnancy.  Test results from any previous visits will be discussed. Your health care provider may ask you:  How you are feeling.  If you are feeling the baby move.  If you have had any abnormal symptoms, such as leaking fluid, bleeding, severe headaches, or abdominal cramping.  If you have any questions. Other   tests that may be performed during your first trimester include:  Blood tests to find your blood type and to check for the presence of any previous infections. They will also be used to check for low iron levels (anemia) and Rh antibodies. Later in the pregnancy, blood tests for diabetes will be done along with other tests if problems develop.  Urine tests to check for infections, diabetes, or protein in the urine.  An ultrasound to confirm the proper growth and development of the baby.  An amniocentesis to check for possible genetic problems.  Fetal  screens for spina bifida and Down syndrome.  You may need other tests to make sure you and the baby are doing well. HOME CARE INSTRUCTIONS  Medicines  Follow your health care provider's instructions regarding medicine use. Specific medicines may be either safe or unsafe to take during pregnancy.  Take your prenatal vitamins as directed.  If you develop constipation, try taking a stool softener if your health care provider approves. Diet  Eat regular, well-balanced meals. Choose a variety of foods, such as meat or vegetable-based protein, fish, milk and low-fat dairy products, vegetables, fruits, and whole grain breads and cereals. Your health care provider will help you determine the amount of weight gain that is right for you.  Avoid raw meat and uncooked cheese. These carry germs that can cause birth defects in the baby.  Eating four or five small meals rather than three large meals a day may help relieve nausea and vomiting. If you start to feel nauseous, eating a few soda crackers can be helpful. Drinking liquids between meals instead of during meals also seems to help nausea and vomiting.  If you develop constipation, eat more high-fiber foods, such as fresh vegetables or fruit and whole grains. Drink enough fluids to keep your urine clear or pale yellow. Activity and Exercise  Exercise only as directed by your health care provider. Exercising will help you:  Control your weight.  Stay in shape.  Be prepared for labor and delivery.  Experiencing pain or cramping in the lower abdomen or low back is a good sign that you should stop exercising. Check with your health care provider before continuing normal exercises.  Try to avoid standing for long periods of time. Move your legs often if you must stand in one place for a long time.  Avoid heavy lifting.  Wear low-heeled shoes, and practice good posture.  You may continue to have sex unless your health care provider directs you  otherwise. Relief of Pain or Discomfort  Wear a good support bra for breast tenderness.   Take warm sitz baths to soothe any pain or discomfort caused by hemorrhoids. Use hemorrhoid cream if your health care provider approves.   Rest with your legs elevated if you have leg cramps or low back pain.  If you develop varicose veins in your legs, wear support hose. Elevate your feet for 15 minutes, 3-4 times a day. Limit salt in your diet. Prenatal Care  Schedule your prenatal visits by the twelfth week of pregnancy. They are usually scheduled monthly at first, then more often in the last 2 months before delivery.  Write down your questions. Take them to your prenatal visits.  Keep all your prenatal visits as directed by your health care provider. Safety  Wear your seat belt at all times when driving.  Make a list of emergency phone numbers, including numbers for family, friends, the hospital, and police and fire departments. General   Tips  Ask your health care provider for a referral to a local prenatal education class. Begin classes no later than at the beginning of month 6 of your pregnancy.  Ask for help if you have counseling or nutritional needs during pregnancy. Your health care provider can offer advice or refer you to specialists for help with various needs.  Do not use hot tubs, steam rooms, or saunas.  Do not douche or use tampons or scented sanitary pads.  Do not cross your legs for long periods of time.  Avoid cat litter boxes and soil used by cats. These carry germs that can cause birth defects in the baby and possibly loss of the fetus by miscarriage or stillbirth.  Avoid all smoking, herbs, alcohol, and medicines not prescribed by your health care provider. Chemicals in these affect the formation and growth of the baby.  Schedule a dentist appointment. At home, brush your teeth with a soft toothbrush and be gentle when you floss. SEEK MEDICAL CARE IF:   You have  dizziness.  You have mild pelvic cramps, pelvic pressure, or nagging pain in the abdominal area.  You have persistent nausea, vomiting, or diarrhea.  You have a bad smelling vaginal discharge.  You have pain with urination.  You notice increased swelling in your face, hands, legs, or ankles. SEEK IMMEDIATE MEDICAL CARE IF:   You have a fever.  You are leaking fluid from your vagina.  You have spotting or bleeding from your vagina.  You have severe abdominal cramping or pain.  You have rapid weight gain or loss.  You vomit blood or material that looks like coffee grounds.  You are exposed to German measles and have never had them.  You are exposed to fifth disease or chickenpox.  You develop a severe headache.  You have shortness of breath.  You have any kind of trauma, such as from a fall or a car accident. Document Released: 04/17/2001 Document Revised: 09/07/2013 Document Reviewed: 03/03/2013 ExitCare Patient Information 2015 [employer]. This information is not intended to replace advice given to you by your health care provider. Make sure you discuss any questions you have with your health care provider.   Nausea & Vomiting  Have saltine crackers or pretzels by your bed and eat a few bites before you raise your head out of bed in the morning  Eat small frequent meals throughout the day instead of large meals  Drink plenty of fluids throughout the day to stay hydrated, just don't drink a lot of fluids with your meals.  This can make your stomach fill up faster making you feel sick  Do not brush your teeth right after you eat  Products with real ginger are good for nausea, like ginger ale and ginger hard candy Make sure it says made with real ginger!  Sucking on sour candy like lemon heads is also good for nausea  If your prenatal vitamins make you nauseated, take them at night so you will sleep through the nausea  Sea Bands  If you feel like you need  medicine for the nausea & vomiting please let us know  If you are unable to keep any fluids or food down please let us know   Constipation  Drink plenty of fluid, preferably water, throughout the day  Eat foods high in fiber such as fruits, vegetables, and grains  Exercise, such as walking, is a good way to keep your bowels regular  Drink warm fluids, especially warm   prune juice, or decaf coffee  Eat a 1/2 cup of real oatmeal (not instant), 1/2 cup applesauce, and 1/2-1 cup warm prune juice every day  If needed, you may take Colace (docusate sodium) stool softener once or twice a day to help keep the stool soft. If you are pregnant, wait until you are out of your first trimester (12-14 weeks of pregnancy)  If you still are having problems with constipation, you may take Miralax once daily as needed to help keep your bowels regular.  If you are pregnant, wait until you are out of your first trimester (12-14 weeks of pregnancy)  Safe Medications in Pregnancy   Acne: Benzoyl Peroxide Salicylic Acid  Backache/Headache: Tylenol: 2 regular strength every 4 hours OR              2 Extra strength every 6 hours  Colds/Coughs/Allergies: Benadryl (alcohol free) 25 mg every 6 hours as needed Breath right strips Claritin Cepacol throat lozenges Chloraseptic throat spray Cold-Eeze- up to three times per day Cough drops, alcohol free Flonase (by prescription only) Guaifenesin Mucinex Robitussin DM (plain only, alcohol free) Saline nasal spray/drops Sudafed (pseudoephedrine) & Actifed ** use only after [redacted] weeks gestation and if you do not have high blood pressure Tylenol Vicks Vaporub Zinc lozenges Zyrtec   Constipation: Colace Ducolax suppositories Fleet enema Glycerin suppositories Metamucil Milk of magnesia Miralax Senokot Smooth move tea  Diarrhea: Kaopectate Imodium A-D  *NO pepto Bismol  Hemorrhoids: Anusol Anusol HC Preparation  H Tucks  Indigestion: Tums Maalox Mylanta Zantac  Pepcid  Insomnia: Benadryl (alcohol free) 25mg every 6 hours as needed Tylenol PM Unisom, no Gelcaps  Leg Cramps: Tums MagGel  Nausea/Vomiting:  Bonine Dramamine Emetrol Ginger extract Sea bands Meclizine  Nausea medication to take during pregnancy:  Unisom (doxylamine succinate 25 mg tablets) Take one tablet daily at bedtime. If symptoms are not adequately controlled, the dose can be increased to a maximum recommended dose of two tablets daily (1/2 tablet in the morning, 1/2 tablet mid-afternoon and one at bedtime). Vitamin B6 100mg tablets. Take one tablet twice a day (up to 200 mg per day).  Skin Rashes: Aveeno products Benadryl cream or 25mg every 6 hours as needed Calamine Lotion 1% cortisone cream  Yeast infection: Gyne-lotrimin 7 Monistat 7   **If taking multiple medications, please check labels to avoid duplicating the same active ingredients **take medication as directed on the label ** Do not exceed 4000 mg of tylenol in 24 hours **Do not take medications that contain aspirin or ibuprofen      

## 2016-10-10 LAB — PMP SCREEN PROFILE (10S), URINE
Amphetamine Scrn, Ur: NEGATIVE ng/mL
BARBITURATE SCREEN URINE: NEGATIVE ng/mL
BENZODIAZEPINE SCREEN, URINE: NEGATIVE ng/mL
CANNABINOIDS UR QL SCN: NEGATIVE ng/mL
Cocaine (Metab) Scrn, Ur: NEGATIVE ng/mL
Creatinine(Crt), U: 221.1 mg/dL (ref 20.0–300.0)
Methadone Screen, Urine: NEGATIVE ng/mL
OPIATE SCREEN URINE: NEGATIVE ng/mL
OXYCODONE+OXYMORPHONE UR QL SCN: NEGATIVE ng/mL
PH UR, DRUG SCRN: 6 (ref 4.5–8.9)
Phencyclidine Qn, Ur: NEGATIVE ng/mL
Propoxyphene Scrn, Ur: NEGATIVE ng/mL

## 2016-10-10 LAB — GC/CHLAMYDIA PROBE AMP
CHLAMYDIA, DNA PROBE: NEGATIVE
NEISSERIA GONORRHOEAE BY PCR: NEGATIVE

## 2016-10-11 LAB — VARICELLA ZOSTER ANTIBODY, IGG: Varicella zoster IgG: 2213 index (ref >165)

## 2016-10-11 LAB — URINALYSIS, ROUTINE W REFLEX MICROSCOPIC
BILIRUBIN UA: NEGATIVE
GLUCOSE, UA: NEGATIVE
Ketones, UA: NEGATIVE
Leukocytes, UA: NEGATIVE
Nitrite, UA: NEGATIVE
RBC UA: NEGATIVE
UUROB: 0.2 mg/dL (ref 0.2–1.0)
pH, UA: 6 (ref 5.0–7.5)

## 2016-10-11 LAB — CBC
Hematocrit: 36.2 % (ref 34.0–46.6)
Hemoglobin: 12.2 g/dL (ref 11.1–15.9)
MCH: 31.4 pg (ref 26.6–33.0)
MCHC: 33.7 g/dL (ref 31.5–35.7)
MCV: 93 fL (ref 79–97)
Platelets: 263 10*3/uL (ref 150–379)
RBC: 3.89 x10E6/uL (ref 3.77–5.28)
RDW: 13.2 % (ref 12.3–15.4)
WBC: 7.5 10*3/uL (ref 3.4–10.8)

## 2016-10-11 LAB — RPR: RPR Ser Ql: NONREACTIVE

## 2016-10-11 LAB — ABO/RH: RH TYPE: POSITIVE

## 2016-10-11 LAB — URINE CULTURE

## 2016-10-11 LAB — HIV ANTIBODY (ROUTINE TESTING W REFLEX): HIV SCREEN 4TH GENERATION: NONREACTIVE

## 2016-10-11 LAB — RUBELLA SCREEN: Rubella Antibodies, IGG: 4.77 index (ref >0.99)

## 2016-10-11 LAB — HEPATITIS B SURFACE ANTIGEN: Hepatitis B Surface Ag: NEGATIVE

## 2016-10-11 LAB — ANTIBODY SCREEN: Antibody Screen: NEGATIVE

## 2016-10-22 ENCOUNTER — Telehealth: Payer: Self-pay | Admitting: *Deleted

## 2016-10-22 MED ORDER — ONDANSETRON HCL 4 MG PO TABS: 4.0000 mg | ORAL_TABLET | Freq: Three times a day (TID) | ORAL | 1 refills | Status: DC | PRN

## 2016-10-22 NOTE — Telephone Encounter (Signed)
Pt aware Rx for zofran sent

## 2016-10-31 ENCOUNTER — Encounter: Payer: Self-pay | Admitting: Advanced Practice Midwife

## 2016-10-31 ENCOUNTER — Ambulatory Visit (INDEPENDENT_AMBULATORY_CARE_PROVIDER_SITE_OTHER): Payer: BC Managed Care – PPO

## 2016-10-31 ENCOUNTER — Ambulatory Visit (INDEPENDENT_AMBULATORY_CARE_PROVIDER_SITE_OTHER): Payer: BC Managed Care – PPO | Admitting: Advanced Practice Midwife

## 2016-10-31 VITALS — BP 100/70 | HR 82 | Wt 213.0 lb

## 2016-10-31 DIAGNOSIS — Z3682 Encounter for antenatal screening for nuchal translucency: Secondary | ICD-10-CM | POA: Diagnosis not present

## 2016-10-31 DIAGNOSIS — Z3401 Encounter for supervision of normal first pregnancy, first trimester: Secondary | ICD-10-CM

## 2016-10-31 DIAGNOSIS — Z1389 Encounter for screening for other disorder: Secondary | ICD-10-CM

## 2016-10-31 DIAGNOSIS — Z331 Pregnant state, incidental: Secondary | ICD-10-CM

## 2016-10-31 DIAGNOSIS — Z3481 Encounter for supervision of other normal pregnancy, first trimester: Secondary | ICD-10-CM

## 2016-10-31 LAB — POCT URINALYSIS DIPSTICK
Blood, UA: NEGATIVE
GLUCOSE UA: NEGATIVE
KETONES UA: NEGATIVE
LEUKOCYTES UA: NEGATIVE
NITRITE UA: NEGATIVE

## 2016-10-31 MED ORDER — BUTALBITAL-APAP-CAFFEINE 50-325-40 MG PO TABS: 1.0000 | ORAL_TABLET | Freq: Four times a day (QID) | ORAL | 0 refills | Status: DC | PRN

## 2016-10-31 NOTE — Progress Notes (Signed)
[place] 12wks,measurements c/w dates,fhr 152 bpm,ant pl gr 0,simple left ovarian cyst 3.9 x 4.2 x 3.7 cm,two simple right ovarian cysts (#) 2.5 x 1.8 x 2.6 cm,2.1 x 1.7 x 1.7 cm,NB present,NT 1.3 mm, crl 60.77 mm

## 2016-10-31 NOTE — Progress Notes (Signed)
[id number] [redacted]w[redacted]d Estimated Date of Delivery: 05/15/17  Blood pressure 100/70, pulse 82, weight 213 lb (96.6 kg), last menstrual period 08/08/2016.   BP weight and urine results all reviewed and noted.  Please refer to the obstetrical flow sheet for the fundal height and fetal heart rate documentation:  [place] 12wks,measurements c/w dates,fhr 152 bpm,ant pl gr 0,simple left ovarian cyst 3.9 x 4.2 x 3.7 cm,two simple right ovarian cysts (#) 2.5 x 1.8 x 2.6 cm,2.1 x 1.7 x 1.7 cm,NB present,NT 1.3 mm, crl 60.77 mm  Patient denies any bleeding and no rupture of membranes symptoms or regular contractions. Patient has had a HA for a week. Even took 5 ibuprofen.  Accupressure to occipital bones worked All questions were answered.  Orders Placed This Encounter  Procedures  . Maternal Screen, Integrated #1  . POCT urinalysis dipstick    Plan:  Continued routine obstetrical care, rx fioricet.   Return in about 4 weeks (around 11/28/2016) for [place], 2nd IT.

## 2016-11-03 LAB — MATERNAL SCREEN, INTEGRATED #1
Crown Rump Length: 60.8 mm
GEST. AGE ON COLLECTION DATE: 12.4 wk
Maternal Age at EDD: 32.9 yr
NUCHAL TRANSLUCENCY (NT): 1.3 mm
Number of Fetuses: 1
PAPP-A VALUE: 483.2 ng/mL
WEIGHT: 213 [lb_av]

## 2016-11-08 ENCOUNTER — Encounter: Payer: Self-pay | Admitting: Women's Health

## 2016-11-08 DIAGNOSIS — N83209 Unspecified ovarian cyst, unspecified side: Secondary | ICD-10-CM | POA: Insufficient documentation

## 2016-11-09 ENCOUNTER — Other Ambulatory Visit: Payer: Self-pay | Admitting: Women's Health

## 2016-11-09 ENCOUNTER — Telehealth: Payer: Self-pay | Admitting: *Deleted

## 2016-11-09 ENCOUNTER — Encounter: Payer: Self-pay | Admitting: Adult Health

## 2016-11-09 MED ORDER — BUTALBITAL-APAP-CAFFEINE 50-325-40 MG PO TABS: 1.0000 | ORAL_TABLET | Freq: Four times a day (QID) | ORAL | 0 refills | Status: DC | PRN

## 2016-11-09 NOTE — Progress Notes (Signed)
Pt sent email stating having to take 2 fioricet daily, was given #20 on 6/26. Leaves Monday for 1.5wks to [place], is scared she will run out. Refill sent along w/ prevention/relief measures to try instead of fioricet.  Roma Schanz, CNM, [facility] 11/09/2016 2:29 PM

## 2016-11-28 ENCOUNTER — Encounter: Payer: Self-pay | Admitting: Advanced Practice Midwife

## 2016-11-28 ENCOUNTER — Ambulatory Visit (INDEPENDENT_AMBULATORY_CARE_PROVIDER_SITE_OTHER): Payer: BC Managed Care – PPO | Admitting: Advanced Practice Midwife

## 2016-11-28 VITALS — BP 94/60 | HR 80 | Wt 211.0 lb

## 2016-11-28 DIAGNOSIS — Z3482 Encounter for supervision of other normal pregnancy, second trimester: Secondary | ICD-10-CM

## 2016-11-28 DIAGNOSIS — Z1379 Encounter for other screening for genetic and chromosomal anomalies: Secondary | ICD-10-CM

## 2016-11-28 DIAGNOSIS — Z363 Encounter for antenatal screening for malformations: Secondary | ICD-10-CM

## 2016-11-28 DIAGNOSIS — Z3A16 16 weeks gestation of pregnancy: Secondary | ICD-10-CM

## 2016-11-28 DIAGNOSIS — Z1389 Encounter for screening for other disorder: Secondary | ICD-10-CM

## 2016-11-28 DIAGNOSIS — Z331 Pregnant state, incidental: Secondary | ICD-10-CM

## 2016-11-28 LAB — POCT URINALYSIS DIPSTICK
GLUCOSE UA: NEGATIVE
Ketones, UA: NEGATIVE
Nitrite, UA: NEGATIVE
Protein, UA: NEGATIVE
RBC UA: NEGATIVE

## 2016-11-28 NOTE — Progress Notes (Signed)
[id number] [redacted]w[redacted]d Estimated Date of Delivery: 05/15/17  Blood pressure 94/60, pulse 80, weight 95.7 kg (211 lb), last menstrual period 08/08/2016.   BP weight and urine results all reviewed and noted.  Please refer to the obstetrical flow sheet for the fundal height and fetal heart rate documentation:  Went to [employer] and its a boy. They told her that her fluid was low.    Patient denies any bleeding and no rupture of membranes symptoms or regular contractions. Patient is without complaints. All questions were answered.  Orders Placed This Encounter  Procedures  . US OB Comp + 14 Wk  . POCT urinalysis dipstick    Plan:  Continued routine obstetrical care, 2nd IT today  Return in about 3 weeks (around 12/19/2016) for [place], DP:[id number].

## 2016-12-02 LAB — MATERNAL SCREEN, INTEGRATED #2
AFP MARKER: 28.6 ng/mL
AFP MOM: 1.14
CROWN RUMP LENGTH: 60.8 mm
DIA MoM: 0.72
DIA Value: 98.4 pg/mL
ESTRIOL UNCONJUGATED: 0.69 ng/mL
GEST. AGE ON COLLECTION DATE: 12.4 wk
GESTATIONAL AGE: 16.4 wk
Maternal Age at EDD: 32.9 yr
Nuchal Translucency (NT): 1.3 mm
Nuchal Translucency MoM: 0.84
Number of Fetuses: 1
PAPP-A MOM: 0.82
PAPP-A VALUE: 483.2 ng/mL
TEST RESULTS: NEGATIVE
WEIGHT: 211 [lb_av]
Weight: 213 [lb_av]
hCG MoM: 0.63
hCG Value: 15.3 IU/mL
uE3 MoM: 0.91

## 2016-12-03 ENCOUNTER — Other Ambulatory Visit: Payer: Self-pay | Admitting: Obstetrics & Gynecology

## 2016-12-19 ENCOUNTER — Encounter: Payer: Self-pay | Admitting: Obstetrics and Gynecology

## 2016-12-19 ENCOUNTER — Ambulatory Visit (INDEPENDENT_AMBULATORY_CARE_PROVIDER_SITE_OTHER): Payer: BC Managed Care – PPO

## 2016-12-19 ENCOUNTER — Ambulatory Visit (INDEPENDENT_AMBULATORY_CARE_PROVIDER_SITE_OTHER): Payer: BC Managed Care – PPO | Admitting: Obstetrics and Gynecology

## 2016-12-19 VITALS — BP 90/60 | HR 78 | Wt 214.4 lb

## 2016-12-19 DIAGNOSIS — Z1389 Encounter for screening for other disorder: Secondary | ICD-10-CM

## 2016-12-19 DIAGNOSIS — Z3402 Encounter for supervision of normal first pregnancy, second trimester: Secondary | ICD-10-CM

## 2016-12-19 DIAGNOSIS — Z363 Encounter for antenatal screening for malformations: Secondary | ICD-10-CM | POA: Diagnosis not present

## 2016-12-19 DIAGNOSIS — O26892 Other specified pregnancy related conditions, second trimester: Secondary | ICD-10-CM

## 2016-12-19 DIAGNOSIS — Z3A19 19 weeks gestation of pregnancy: Secondary | ICD-10-CM

## 2016-12-19 DIAGNOSIS — Z3401 Encounter for supervision of normal first pregnancy, first trimester: Secondary | ICD-10-CM

## 2016-12-19 DIAGNOSIS — Z331 Pregnant state, incidental: Secondary | ICD-10-CM

## 2016-12-19 LAB — POCT URINALYSIS DIPSTICK
Blood, UA: NEGATIVE
GLUCOSE UA: NEGATIVE
Ketones, UA: NEGATIVE
Leukocytes, UA: NEGATIVE
NITRITE UA: NEGATIVE
Protein, UA: NEGATIVE

## 2016-12-19 NOTE — Progress Notes (Signed)
[place] 19 wks,cephalic, cx 4.1 cm,anterior pl gr 0,normal right ovary,simple left ov cyst 3.1 x 2.4 x 2.6 cm,afi 5.4 cm,fhr 162 bpm,efw 312 g,anatomy complete,no obvious abnormalities

## 2016-12-19 NOTE — Progress Notes (Signed)
Patient ID: Julia Rangel, female   DOB: 1985-07-03, 31 y.o.   MRN: [id number]  Estimated Date of Delivery: 05/15/17 LROB [redacted]w[redacted]d  Chief Complaint  Patient presents with  . Routine Prenatal Visit    ultrasound/ c/o itch all over  ____  Patient complaints: Itchy all over and has made herself bleed. Pt wants to have a tubal ligation. Patient reports good fetal movement, denies any bleeding , rupture of membranes,or regular contractions.  Blood pressure 90/60, pulse 78, weight 214 lb 6.4 oz (97.3 kg), last menstrual period 08/08/2016.   Urine results:notable for negative refer to the ob flow sheet for FH and FHR, ,                          Physical Examination: General appearance - alert, well appearing, and in no distress and oriented to person, place, and time                                      Abdomen - FH 22 cm  u+2                                                        -FHR 162 per [place]     Questions were answered. Assessment: LROB G3P2002 @ [redacted]w[redacted]d                        Pruritis in pregnancy will check bile acids Plan:  Continued routine obstetrical care, bile acids lab to be completed today. She is to use benadryl cream or take claritin for sx.  F/u in 4 weeks for LROB  By signing my name below, I, Margit Banda, attest that this documentation has been prepared under the direction and in the presence of Jonnie Kind, MD. Electronically Signed: Margit Banda, Medical Scribe. 12/19/16. 9:22 AM.  I personally performed the services described in this documentation, which was SCRIBED in my presence. The recorded information has been reviewed and considered accurate. It has been edited as necessary during review. Jonnie Kind, MD

## 2016-12-24 LAB — BILE ACIDS, TOTAL: BILE ACIDS TOTAL: 8 umol/L (ref 4.7–24.5)

## 2017-01-16 ENCOUNTER — Ambulatory Visit (INDEPENDENT_AMBULATORY_CARE_PROVIDER_SITE_OTHER): Payer: BC Managed Care – PPO | Admitting: Advanced Practice Midwife

## 2017-01-16 ENCOUNTER — Encounter: Payer: Self-pay | Admitting: Advanced Practice Midwife

## 2017-01-16 DIAGNOSIS — Z1389 Encounter for screening for other disorder: Secondary | ICD-10-CM

## 2017-01-16 DIAGNOSIS — Z3A23 23 weeks gestation of pregnancy: Secondary | ICD-10-CM

## 2017-01-16 DIAGNOSIS — Z331 Pregnant state, incidental: Secondary | ICD-10-CM

## 2017-01-16 DIAGNOSIS — Z23 Encounter for immunization: Secondary | ICD-10-CM

## 2017-01-16 DIAGNOSIS — Z3483 Encounter for supervision of other normal pregnancy, third trimester: Secondary | ICD-10-CM

## 2017-01-16 LAB — POCT URINALYSIS DIPSTICK
Glucose, UA: NEGATIVE
Ketones, UA: NEGATIVE
Leukocytes, UA: NEGATIVE
NITRITE UA: NEGATIVE
PROTEIN UA: NEGATIVE
RBC UA: NEGATIVE

## 2017-01-16 NOTE — Patient Instructions (Signed)
1. Before your test, do not eat or drink anything for 8-10 hours prior to your  appointment (a small amount of water is allowed and you may take any medicines you normally take). Be sure to drink lots of water the day before. 2. When you arrive, your blood will be drawn for a 'fasting' blood sugar level.  Then you will be given a sweetened carbonated beverage to drink. You should  complete drinking this beverage within five minutes. After finishing the  beverage, you will have your blood drawn exactly 1 and 2 hours later. Having  your blood drawn on time is an important part of this test. A total of three blood  samples will be done. 3. The test takes approximately 2  hours. During the test, do not have anything to  eat or drink. Do not smoke, chew gum (not even sugarless gum) or use breath mints.  4. During the test you should remain close by and seated as much as possible and  avoid walking around. You may want to bring a book or something else to  occupy your time.  5. After your test, you may eat and drink as normal. You may want to bring a snack  to eat after the test is finished. Your provider will advise you as to the results of  this test and any follow-up if necessary  If your sugar test is positive for gestational diabetes, you will be given an phone call and further instructions discussed. If you wish to know all of your test results before your next appointment, feel free to call the office, or look up your test results on Mychart.  (The range that the lab uses for normal values of the sugar test are not necessarily the range that is used for pregnant women; if your results are within the normal range, they are definitely normal.  However, if a value is deemed "high" by the lab, it may not be too high for a pregnant woman.  We will need to discuss the results if your value(s) fall in the "high" category).     Tdap Vaccine  It is recommended that you get the Tdap vaccine during the  third trimester of EACH pregnancy to help protect your baby from getting pertussis (whooping cough)  27-36 weeks is the BEST time to do this so that you can pass the protection on to your baby. During pregnancy is better than after pregnancy, but if you are unable to get it during pregnancy it will be offered at the hospital.  You can get this vaccine at the health department or your family doctor, as well as some pharmacies.  Everyone who will be around your baby should also be up-to-date on their vaccines. Adults (who are not pregnant) only need 1 dose of Tdap during adulthood.

## 2017-01-16 NOTE — Progress Notes (Signed)
[id number] [redacted]w[redacted]d Estimated Date of Delivery: 05/15/17  Blood pressure (!) 80/50, pulse 72, weight 221 lb (100.2 kg), last menstrual period 08/08/2016.   BP weight and urine results all reviewed and noted.  Please refer to the obstetrical flow sheet for the fundal height and fetal heart rate documentation:  Patient reports good fetal movement, denies any bleeding and no rupture of membranes symptoms or regular contractions. Patient is without complaints. All questions were answered.  Orders Placed This Encounter  Procedures  . POCT urinalysis dipstick    Plan:  Continued routine obstetrical care,   Return in about 4 weeks (around 02/13/2017) for PN2/LROB.

## 2017-01-16 NOTE — Progress Notes (Signed)
error 

## 2017-02-13 ENCOUNTER — Other Ambulatory Visit: Payer: BC Managed Care – PPO

## 2017-02-13 ENCOUNTER — Ambulatory Visit (INDEPENDENT_AMBULATORY_CARE_PROVIDER_SITE_OTHER): Payer: BC Managed Care – PPO | Admitting: Advanced Practice Midwife

## 2017-02-13 ENCOUNTER — Encounter: Payer: Self-pay | Admitting: Advanced Practice Midwife

## 2017-02-13 VITALS — BP 110/70 | HR 89 | Wt 227.0 lb

## 2017-02-13 DIAGNOSIS — Z3482 Encounter for supervision of other normal pregnancy, second trimester: Secondary | ICD-10-CM

## 2017-02-13 DIAGNOSIS — Z3A27 27 weeks gestation of pregnancy: Secondary | ICD-10-CM

## 2017-02-13 DIAGNOSIS — Z131 Encounter for screening for diabetes mellitus: Secondary | ICD-10-CM

## 2017-02-13 NOTE — Progress Notes (Signed)
[id number] [redacted]w[redacted]d Estimated Date of Delivery: 05/15/17  Last menstrual period 08/08/2016.   BP weight and urine results all reviewed and noted.  Please refer to the obstetrical flow sheet for the fundal height and fetal heart rate documentation:  Patient reports good fetal movement, denies any bleeding and no rupture of membranes symptoms or regular contractions. Patient is without complaints. All questions were answered.  No orders of the defined types were placed in this encounter.   Plan:  Continued routine obstetrical care, PN2 today  Return in about 3 weeks (around 03/06/2017) for LROB.

## 2017-02-13 NOTE — Patient Instructions (Signed)

## 2017-02-14 LAB — CBC
HEMATOCRIT: 30.3 % — AB (ref 34.0–46.6)
HEMOGLOBIN: 10.4 g/dL — AB (ref 11.1–15.9)
MCH: 30.3 pg (ref 26.6–33.0)
MCHC: 34.3 g/dL (ref 31.5–35.7)
MCV: 88 fL (ref 79–97)
Platelets: 201 10*3/uL (ref 150–379)
RBC: 3.43 x10E6/uL — ABNORMAL LOW (ref 3.77–5.28)
RDW: 12.8 % (ref 12.3–15.4)
WBC: 6.6 10*3/uL (ref 3.4–10.8)

## 2017-02-14 LAB — GLUCOSE TOLERANCE, 2 HOURS W/ 1HR
GLUCOSE, 1 HOUR: 117 mg/dL (ref 65–179)
Glucose, 2 hour: 108 mg/dL (ref 65–152)
Glucose, Fasting: 71 mg/dL (ref 65–91)

## 2017-02-14 LAB — ANTIBODY SCREEN: Antibody Screen: NEGATIVE

## 2017-02-14 LAB — RPR: RPR: NONREACTIVE

## 2017-02-14 LAB — HIV ANTIBODY (ROUTINE TESTING W REFLEX): HIV Screen 4th Generation wRfx: NONREACTIVE

## 2017-03-06 ENCOUNTER — Ambulatory Visit (INDEPENDENT_AMBULATORY_CARE_PROVIDER_SITE_OTHER): Payer: BC Managed Care – PPO | Admitting: Advanced Practice Midwife

## 2017-03-06 ENCOUNTER — Ambulatory Visit (INDEPENDENT_AMBULATORY_CARE_PROVIDER_SITE_OTHER): Payer: BC Managed Care – PPO

## 2017-03-06 ENCOUNTER — Encounter: Payer: Self-pay | Admitting: Advanced Practice Midwife

## 2017-03-06 VITALS — BP 120/80 | HR 97 | Wt 228.0 lb

## 2017-03-06 DIAGNOSIS — Z1389 Encounter for screening for other disorder: Secondary | ICD-10-CM

## 2017-03-06 DIAGNOSIS — O26843 Uterine size-date discrepancy, third trimester: Secondary | ICD-10-CM

## 2017-03-06 DIAGNOSIS — Z3483 Encounter for supervision of other normal pregnancy, third trimester: Secondary | ICD-10-CM

## 2017-03-06 DIAGNOSIS — Z331 Pregnant state, incidental: Secondary | ICD-10-CM

## 2017-03-06 DIAGNOSIS — Z3A30 30 weeks gestation of pregnancy: Secondary | ICD-10-CM | POA: Diagnosis not present

## 2017-03-06 DIAGNOSIS — Z3402 Encounter for supervision of normal first pregnancy, second trimester: Secondary | ICD-10-CM

## 2017-03-06 LAB — POCT URINALYSIS DIPSTICK
GLUCOSE UA: NEGATIVE
Ketones, UA: NEGATIVE
LEUKOCYTES UA: NEGATIVE
NITRITE UA: NEGATIVE
RBC UA: NEGATIVE

## 2017-03-06 NOTE — Progress Notes (Signed)
[place] 30 wks,cephalic,ant pl gr 0,normal ovaries bilat,afi 26 cm (polyhydramnios),fhr 140 bpm,EFW 1922 g 81%,BPD 99%,HC 99%,AC 96%

## 2017-03-06 NOTE — Progress Notes (Signed)
[id number] [redacted]w[redacted]d Estimated Date of Delivery: 05/15/17  Blood pressure 120/80, pulse 97, weight 228 lb (103.4 kg), last menstrual period 08/08/2016.   BP weight and urine results all reviewed and noted.  Please refer to the obstetrical flow sheet for the fundal height and fetal heart rate documentation:  FH 35 today ? position  Patient reports good fetal movement, denies any bleeding and no rupture of membranes symptoms or regular contractions. Patient is without complaints. All questions were answered.  Orders Placed This Encounter  Procedures  . US OB Follow Up  . POCT urinalysis dipstick    Plan:  Continued routine obstetrical care,   Return in about 2 weeks (around 03/20/2017) for [place] and 3:30 ofor EFW/AFI.

## 2017-03-06 NOTE — Patient Instructions (Signed)
Julia Rangel, I greatly value your feedback.  If you receive a survey following your visit with [place] today, we appreciate you taking the time to fill it out.  Thanks, Knute Neu, CNM, WHNP-BC   Call the office [phone]) or go to [facility] if:  You begin to have strong, frequent contractions  Your water breaks.  Sometimes it is a big gush of fluid, sometimes it is just a trickle that keeps getting your panties wet or running down your legs  You have vaginal bleeding.  It is normal to have a small amount of spotting if your cervix was checked.   You don't feel your baby moving like normal.  If you don't, get you something to eat and drink and lay down and focus on feeling your baby move.  You should feel at least 10 movements in 2 hours.  If you don't, you should call the office or go to [facility].    Tdap Vaccine  It is recommended that you get the Tdap vaccine during the third trimester of EACH pregnancy to help protect your baby from getting pertussis (whooping cough)  27-36 weeks is the BEST time to do this so that you can pass the protection on to your baby. During pregnancy is better than after pregnancy, but if you are unable to get it during pregnancy it will be offered at the hospital.   You can get this vaccine at the health department or your family doctor  Everyone who will be around your baby should also be up-to-date on their vaccines. Adults (who are not pregnant) only need 1 dose of Tdap during adulthood.   Third Trimester of Pregnancy The third trimester is from week 29 through week 42, months 7 through 9. The third trimester is a time when the fetus is growing rapidly. At the end of the ninth month, the fetus is about 20 inches in length and weighs 6-10 pounds.  BODY CHANGES Your body goes through many changes during pregnancy. The changes vary from woman to woman.   Your weight will continue to increase. You can expect to gain 25-35 pounds (11-16 kg) by the  end of the pregnancy.  You may begin to get stretch marks on your hips, abdomen, and breasts.  You may urinate more often because the fetus is moving lower into your pelvis and pressing on your bladder.  You may develop or continue to have heartburn as a result of your pregnancy.  You may develop constipation because certain hormones are causing the muscles that push waste through your intestines to slow down.  You may develop hemorrhoids or swollen, bulging veins (varicose veins).  You may have pelvic pain because of the weight gain and pregnancy hormones relaxing your joints between the bones in your pelvis. Backaches may result from overexertion of the muscles supporting your posture.  You may have changes in your hair. These can include thickening of your hair, rapid growth, and changes in texture. Some women also have hair loss during or after pregnancy, or hair that feels dry or thin. Your hair will most likely return to normal after your baby is born.  Your breasts will continue to grow and be tender. A yellow discharge may leak from your breasts called colostrum.  Your belly button may stick out.  You may feel short of breath because of your expanding uterus.  You may notice the fetus "dropping," or moving lower in your abdomen.  You may have a bloody mucus  discharge. This usually occurs a few days to a week before labor begins.  Your cervix becomes thin and soft (effaced) near your due date. WHAT TO EXPECT AT YOUR PRENATAL EXAMS  You will have prenatal exams every 2 weeks until week 36. Then, you will have weekly prenatal exams. During a routine prenatal visit:  You will be weighed to make sure you and the fetus are growing normally.  Your blood pressure is taken.  Your abdomen will be measured to track your baby's growth.  The fetal heartbeat will be listened to.  Any test results from the previous visit will be discussed.  You may have a cervical check near your due  date to see if you have effaced. At around 36 weeks, your caregiver will check your cervix. At the same time, your caregiver will also perform a test on the secretions of the vaginal tissue. This test is to determine if a type of bacteria, Group B streptococcus, is present. Your caregiver will explain this further. Your caregiver may ask you:  What your birth plan is.  How you are feeling.  If you are feeling the baby move.  If you have had any abnormal symptoms, such as leaking fluid, bleeding, severe headaches, or abdominal cramping.  If you have any questions. Other tests or screenings that may be performed during your third trimester include:  Blood tests that check for low iron levels (anemia).  Fetal testing to check the health, activity level, and growth of the fetus. Testing is done if you have certain medical conditions or if there are problems during the pregnancy. FALSE LABOR You may feel small, irregular contractions that eventually go away. These are called Braxton Hicks contractions, or false labor. Contractions may last for hours, days, or even weeks before true labor sets in. If contractions come at regular intervals, intensify, or become painful, it is best to be seen by your caregiver.  SIGNS OF LABOR   Menstrual-like cramps.  Contractions that are 5 minutes apart or less.  Contractions that start on the top of the uterus and spread down to the lower abdomen and back.  A sense of increased pelvic pressure or back pain.  A watery or bloody mucus discharge that comes from the vagina. If you have any of these signs before the 37th week of pregnancy, call your caregiver right away. You need to go to the hospital to get checked immediately. HOME CARE INSTRUCTIONS   Avoid all smoking, herbs, alcohol, and unprescribed drugs. These chemicals affect the formation and growth of the baby.  Follow your caregiver's instructions regarding medicine use. There are medicines that  are either safe or unsafe to take during pregnancy.  Exercise only as directed by your caregiver. Experiencing uterine cramps is a good sign to stop exercising.  Continue to eat regular, healthy meals.  Wear a good support bra for breast tenderness.  Do not use hot tubs, steam rooms, or saunas.  Wear your seat belt at all times when driving.  Avoid raw meat, uncooked cheese, cat litter boxes, and soil used by cats. These carry germs that can cause birth defects in the baby.  Take your prenatal vitamins.  Try taking a stool softener (if your caregiver approves) if you develop constipation. Eat more high-fiber foods, such as fresh vegetables or fruit and whole grains. Drink plenty of fluids to keep your urine clear or pale yellow.  Take warm sitz baths to soothe any pain or discomfort caused by hemorrhoids. Use  hemorrhoid cream if your caregiver approves.  If you develop varicose veins, wear support hose. Elevate your feet for 15 minutes, 3-4 times a day. Limit salt in your diet.  Avoid heavy lifting, wear low heal shoes, and practice good posture.  Rest a lot with your legs elevated if you have leg cramps or low back pain.  Visit your dentist if you have not gone during your pregnancy. Use a soft toothbrush to brush your teeth and be gentle when you floss.  A sexual relationship may be continued unless your caregiver directs you otherwise.  Do not travel far distances unless it is absolutely necessary and only with the approval of your caregiver.  Take prenatal classes to understand, practice, and ask questions about the labor and delivery.  Make a trial run to the hospital.  Pack your hospital bag.  Prepare the baby's nursery.  Continue to go to all your prenatal visits as directed by your caregiver. SEEK MEDICAL CARE IF:  You are unsure if you are in labor or if your water has broken.  You have dizziness.  You have mild pelvic cramps, pelvic pressure, or nagging pain  in your abdominal area.  You have persistent nausea, vomiting, or diarrhea.  You have a bad smelling vaginal discharge.  You have pain with urination. SEEK IMMEDIATE MEDICAL CARE IF:   You have a fever.  You are leaking fluid from your vagina.  You have spotting or bleeding from your vagina.  You have severe abdominal cramping or pain.  You have rapid weight loss or gain.  You have shortness of breath with chest pain.  You notice sudden or extreme swelling of your face, hands, ankles, feet, or legs.  You have not felt your baby move in over an hour.  You have severe headaches that do not go away with medicine.  You have vision changes. Document Released: 04/17/2001 Document Revised: 04/28/2013 Document Reviewed: 06/24/2012 [facility] Patient Information 2015 [place], [place]. This information is not intended to replace advice given to you by your health care provider. Make sure you discuss any questions you have with your health care provider.

## 2017-03-07 ENCOUNTER — Other Ambulatory Visit: Payer: Self-pay | Admitting: Obstetrics & Gynecology

## 2017-03-07 ENCOUNTER — Encounter: Payer: Self-pay | Admitting: Advanced Practice Midwife

## 2017-03-11 ENCOUNTER — Telehealth: Payer: Self-pay | Admitting: *Deleted

## 2017-03-11 NOTE — Telephone Encounter (Signed)
LMOVM that Manus Gunning was out of the office and didn't see any mention of it in her last note. Just wanted to clarify and could possibly get Dr Elonda Husky to fill. Advised to call back.

## 2017-03-12 ENCOUNTER — Other Ambulatory Visit (HOSPITAL_COMMUNITY): Payer: Self-pay | Admitting: Advanced Practice Midwife

## 2017-03-12 DIAGNOSIS — O403XX0 Polyhydramnios, third trimester, not applicable or unspecified: Secondary | ICD-10-CM

## 2017-03-13 ENCOUNTER — Encounter: Payer: Self-pay | Admitting: Advanced Practice Midwife

## 2017-03-13 ENCOUNTER — Ambulatory Visit (INDEPENDENT_AMBULATORY_CARE_PROVIDER_SITE_OTHER): Payer: BC Managed Care – PPO

## 2017-03-13 ENCOUNTER — Other Ambulatory Visit: Payer: Self-pay | Admitting: Advanced Practice Midwife

## 2017-03-13 ENCOUNTER — Ambulatory Visit (INDEPENDENT_AMBULATORY_CARE_PROVIDER_SITE_OTHER): Payer: BC Managed Care – PPO | Admitting: Advanced Practice Midwife

## 2017-03-13 VITALS — BP 112/74 | HR 90 | Wt 230.0 lb

## 2017-03-13 DIAGNOSIS — Z1389 Encounter for screening for other disorder: Secondary | ICD-10-CM

## 2017-03-13 DIAGNOSIS — Z3A31 31 weeks gestation of pregnancy: Secondary | ICD-10-CM

## 2017-03-13 DIAGNOSIS — O403XX0 Polyhydramnios, third trimester, not applicable or unspecified: Secondary | ICD-10-CM | POA: Diagnosis not present

## 2017-03-13 DIAGNOSIS — Z331 Pregnant state, incidental: Secondary | ICD-10-CM

## 2017-03-13 DIAGNOSIS — O401XX0 Polyhydramnios, first trimester, not applicable or unspecified: Secondary | ICD-10-CM

## 2017-03-13 DIAGNOSIS — O0992 Supervision of high risk pregnancy, unspecified, second trimester: Secondary | ICD-10-CM

## 2017-03-13 DIAGNOSIS — O0993 Supervision of high risk pregnancy, unspecified, third trimester: Secondary | ICD-10-CM

## 2017-03-13 DIAGNOSIS — O409XX0 Polyhydramnios, unspecified trimester, not applicable or unspecified: Secondary | ICD-10-CM | POA: Insufficient documentation

## 2017-03-13 LAB — POCT URINALYSIS DIPSTICK
Blood, UA: NEGATIVE
Glucose, UA: NEGATIVE
Ketones, UA: NEGATIVE
LEUKOCYTES UA: NEGATIVE
NITRITE UA: NEGATIVE
PROTEIN UA: NEGATIVE

## 2017-03-13 MED ORDER — FLUOXETINE HCL 20 MG PO CAPS: 20.0000 mg | ORAL_CAPSULE | Freq: Every day | ORAL | 6 refills | Status: DC

## 2017-03-13 NOTE — Progress Notes (Signed)
[place] 31 wks,cephalic,fhr 803 bpm,BPP 2/1,YYQ 25.6 cm,anterior pl gr 1

## 2017-03-13 NOTE — Progress Notes (Signed)
HIGH-RISK PREGNANCY VISIT Patient name: Julia Rangel MRN [id number]  Date of birth: 12-Apr-1986 Chief Complaint:   High Risk Gestation (u/s today)  History of Present Illness:   Kairy Folsom is a 31 y.o. G65P2002 female at [redacted]w[redacted]d with an Estimated Date of Delivery: 05/15/17 being seen today for ongoing management of a high-risk pregnancy complicated by polyhydramnios, dx last week d/t size>dates.  AFI 26cm. @hr  Gtt at 27 weeks was perfectly normal, not even close to borderline Today she reports no complaints. Contractions: Irregular. Vag. Bleeding: None.  Movement: Present. denies leaking of fluid.  Review of Systems:   Pertinent items are noted in HPI Denies abnormal vaginal discharge w/ itching/odor/irritation, headaches, visual changes, shortness of breath, chest pain, abdominal pain, severe nausea/vomiting, or problems with urination or bowel movements unless otherwise stated above.    Pertinent History Reviewed:  Medical & Surgical Hx:   Past Medical History:  Diagnosis Date  . Anemia   . Dysplasia of cervix   . Kidney stone   . Menorrhagia   . Migraines    since age 1 per patient  . Patient desires pregnancy 06/01/2013  . 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 PMDD (premenstrual dysphoric disorder)    PROZAC 20 MG BUT NONE WITH PREGNANCY  . PONV (postoperative nausea and vomiting)   . Vaginal Pap smear, abnormal    Past Surgical History:  Procedure Laterality Date  . CESAREAN SECTION  2010  . KIDNEY STONE SURGERY  2010   stent  . LEEP  2008  . phylloid removed  2009   left breast  . TONSILLECTOMY    . WISDOM TOOTH EXTRACTION     Family History  Problem Relation Age of Onset  . Diabetes Maternal Grandmother   . Cancer Maternal Grandmother        breast  . Diabetes Paternal Grandmother   . Cancer Paternal Grandmother        breast  . COPD Paternal Grandfather     Current Outpatient Medications:  .   butalbital-acetaminophen-caffeine (FIORICET, ESGIC) 50-325-40 MG tablet, TAKE 1 TABLET BY MOUTH EVERY 6 HOURS AS NEEDED FOR HEADACHE, Disp: 20 tablet, Rfl: 2 .  FLUoxetine (PROZAC) 20 MG capsule, Take 1 capsule (20 mg total) daily by mouth., Disp: 4230 capsule, Rfl: 6 .  Prenatal MV & Min w/FA-DHA (PRENATAL ADULT GUMMY/DHA/FA PO), Take by mouth. Takes 2 daily, Disp: , Rfl:  .  Doxylamine-Pyridoxine ER (BONJESTA) 20-20 MG TBCR, Take 20 mg by mouth at bedtime. Take 2 qhs; may take one in the moring and one in the afternoon prn nausea (Patient not taking: Reported on 10/31/2016), Disp: 120 tablet, Rfl: 3 .  ondansetron (ZOFRAN) 4 MG tablet, Take 1 tablet (4 mg total) by mouth every 8 (eight) hours as needed for nausea or vomiting. (Patient not taking: Reported on 12/19/2016), Disp: 20 tablet, Rfl: 1 Social History: Reviewed -  reports that  has never smoked. she has never used smokeless tobacco.                  Physical Assessment:   Vitals:   03/13/17 1606  BP: 112/74  Pulse: 90  Weight: 230 lb (104.3 kg)  Body mass index is 37.12 kg/m.           Physical Examination:   General appearance: alert, well appearing, and in no distress  Mental status: alert, oriented to person, place, and time  Skin: warm & dry  Extremities: Edema: Trace    Cardiovascular: normal heart rate noted  Respiratory: normal respiratory effort, no distress  Abdomen: gravid, soft, non-tender  Pelvic: Cervical exam deferred         Fetal Status:     Movement: Present    Fetal Surveillance Testing today: BPP:  8/8  Results for orders placed or performed in visit on 03/13/17 (from the past 24 hour(s))  POCT urinalysis dipstick   Collection Time: 03/13/17  4:08 PM  Result Value Ref Range   Color, UA     Clarity, UA     Glucose, UA neg    Bilirubin, UA     Ketones, UA neg    Spec Grav, UA  1.010 - 1.025   Blood, UA neg    pH, UA  5.0 - 8.0   Protein, UA neg    Urobilinogen, UA  0.2 or 1.0 E.U./dL   Nitrite,  UA neg    Leukocytes, UA Negative Negative    Assessment & Plan:  1) High-risk pregnancy G3P2002 at [redacted]w[redacted]d with an Estimated Date of Delivery: 05/15/17   2) Polyhydramnios, stable  3) ,   Labs/procedures today: AFI 25.6cm  Treatment Plan:  BPP weekly Reviewed: Preterm labor symptoms and general obstetric precautions including but not limited to vaginal bleeding, contractions, leaking of fluid and fetal movement were reviewed in detail with the patient.  All questions were answered.  Follow-up: Return for weekly BPPs.  Orders Placed This Encounter  Procedures  . US FETAL BPP WO NON STRESS  . POCT urinalysis dipstick   CRESENZO-DISHMAN,Yelena Metzer CNM, 03/13/2017 4:17 PM

## 2017-03-13 NOTE — Patient Instructions (Signed)
Julia Rangel, I greatly value your feedback.  If you receive a survey following your visit with [place] today, we appreciate you taking the time to fill it out.  Thanks, Knute Neu, CNM, WHNP-BC   Call the office [phone]) or go to [facility] if:  You begin to have strong, frequent contractions  Your water breaks.  Sometimes it is a big gush of fluid, sometimes it is just a trickle that keeps getting your panties wet or running down your legs  You have vaginal bleeding.  It is normal to have a small amount of spotting if your cervix was checked.   You don't feel your baby moving like normal.  If you don't, get you something to eat and drink and lay down and focus on feeling your baby move.  You should feel at least 10 movements in 2 hours.  If you don't, you should call the office or go to [facility].    Tdap Vaccine  It is recommended that you get the Tdap vaccine during the third trimester of EACH pregnancy to help protect your baby from getting pertussis (whooping cough)  27-36 weeks is the BEST time to do this so that you can pass the protection on to your baby. During pregnancy is better than after pregnancy, but if you are unable to get it during pregnancy it will be offered at the hospital.   You can get this vaccine at the health department or your family doctor  Everyone who will be around your baby should also be up-to-date on their vaccines. Adults (who are not pregnant) only need 1 dose of Tdap during adulthood.   Third Trimester of Pregnancy The third trimester is from week 29 through week 42, months 7 through 9. The third trimester is a time when the fetus is growing rapidly. At the end of the ninth month, the fetus is about 20 inches in length and weighs 6-10 pounds.  BODY CHANGES Your body goes through many changes during pregnancy. The changes vary from woman to woman.   Your weight will continue to increase. You can expect to gain 25-35 pounds (11-16 kg) by the  end of the pregnancy.  You may begin to get stretch marks on your hips, abdomen, and breasts.  You may urinate more often because the fetus is moving lower into your pelvis and pressing on your bladder.  You may develop or continue to have heartburn as a result of your pregnancy.  You may develop constipation because certain hormones are causing the muscles that push waste through your intestines to slow down.  You may develop hemorrhoids or swollen, bulging veins (varicose veins).  You may have pelvic pain because of the weight gain and pregnancy hormones relaxing your joints between the bones in your pelvis. Backaches may result from overexertion of the muscles supporting your posture.  You may have changes in your hair. These can include thickening of your hair, rapid growth, and changes in texture. Some women also have hair loss during or after pregnancy, or hair that feels dry or thin. Your hair will most likely return to normal after your baby is born.  Your breasts will continue to grow and be tender. A yellow discharge may leak from your breasts called colostrum.  Your belly button may stick out.  You may feel short of breath because of your expanding uterus.  You may notice the fetus "dropping," or moving lower in your abdomen.  You may have a bloody mucus  discharge. This usually occurs a few days to a week before labor begins.  Your cervix becomes thin and soft (effaced) near your due date. WHAT TO EXPECT AT YOUR PRENATAL EXAMS  You will have prenatal exams every 2 weeks until week 36. Then, you will have weekly prenatal exams. During a routine prenatal visit:  You will be weighed to make sure you and the fetus are growing normally.  Your blood pressure is taken.  Your abdomen will be measured to track your baby's growth.  The fetal heartbeat will be listened to.  Any test results from the previous visit will be discussed.  You may have a cervical check near your due  date to see if you have effaced. At around 36 weeks, your caregiver will check your cervix. At the same time, your caregiver will also perform a test on the secretions of the vaginal tissue. This test is to determine if a type of bacteria, Group B streptococcus, is present. Your caregiver will explain this further. Your caregiver may ask you:  What your birth plan is.  How you are feeling.  If you are feeling the baby move.  If you have had any abnormal symptoms, such as leaking fluid, bleeding, severe headaches, or abdominal cramping.  If you have any questions. Other tests or screenings that may be performed during your third trimester include:  Blood tests that check for low iron levels (anemia).  Fetal testing to check the health, activity level, and growth of the fetus. Testing is done if you have certain medical conditions or if there are problems during the pregnancy. FALSE LABOR You may feel small, irregular contractions that eventually go away. These are called Braxton Hicks contractions, or false labor. Contractions may last for hours, days, or even weeks before true labor sets in. If contractions come at regular intervals, intensify, or become painful, it is best to be seen by your caregiver.  SIGNS OF LABOR   Menstrual-like cramps.  Contractions that are 5 minutes apart or less.  Contractions that start on the top of the uterus and spread down to the lower abdomen and back.  A sense of increased pelvic pressure or back pain.  A watery or bloody mucus discharge that comes from the vagina. If you have any of these signs before the 37th week of pregnancy, call your caregiver right away. You need to go to the hospital to get checked immediately. HOME CARE INSTRUCTIONS   Avoid all smoking, herbs, alcohol, and unprescribed drugs. These chemicals affect the formation and growth of the baby.  Follow your caregiver's instructions regarding medicine use. There are medicines that  are either safe or unsafe to take during pregnancy.  Exercise only as directed by your caregiver. Experiencing uterine cramps is a good sign to stop exercising.  Continue to eat regular, healthy meals.  Wear a good support bra for breast tenderness.  Do not use hot tubs, steam rooms, or saunas.  Wear your seat belt at all times when driving.  Avoid raw meat, uncooked cheese, cat litter boxes, and soil used by cats. These carry germs that can cause birth defects in the baby.  Take your prenatal vitamins.  Try taking a stool softener (if your caregiver approves) if you develop constipation. Eat more high-fiber foods, such as fresh vegetables or fruit and whole grains. Drink plenty of fluids to keep your urine clear or pale yellow.  Take warm sitz baths to soothe any pain or discomfort caused by hemorrhoids. Use  hemorrhoid cream if your caregiver approves.  If you develop varicose veins, wear support hose. Elevate your feet for 15 minutes, 3-4 times a day. Limit salt in your diet.  Avoid heavy lifting, wear low heal shoes, and practice good posture.  Rest a lot with your legs elevated if you have leg cramps or low back pain.  Visit your dentist if you have not gone during your pregnancy. Use a soft toothbrush to brush your teeth and be gentle when you floss.  A sexual relationship may be continued unless your caregiver directs you otherwise.  Do not travel far distances unless it is absolutely necessary and only with the approval of your caregiver.  Take prenatal classes to understand, practice, and ask questions about the labor and delivery.  Make a trial run to the hospital.  Pack your hospital bag.  Prepare the baby's nursery.  Continue to go to all your prenatal visits as directed by your caregiver. SEEK MEDICAL CARE IF:  You are unsure if you are in labor or if your water has broken.  You have dizziness.  You have mild pelvic cramps, pelvic pressure, or nagging pain  in your abdominal area.  You have persistent nausea, vomiting, or diarrhea.  You have a bad smelling vaginal discharge.  You have pain with urination. SEEK IMMEDIATE MEDICAL CARE IF:   You have a fever.  You are leaking fluid from your vagina.  You have spotting or bleeding from your vagina.  You have severe abdominal cramping or pain.  You have rapid weight loss or gain.  You have shortness of breath with chest pain.  You notice sudden or extreme swelling of your face, hands, ankles, feet, or legs.  You have not felt your baby move in over an hour.  You have severe headaches that do not go away with medicine.  You have vision changes. Document Released: 04/17/2001 Document Revised: 04/28/2013 Document Reviewed: 06/24/2012 [facility] Patient Information 2015 [place], [place]. This information is not intended to replace advice given to you by your health care provider. Make sure you discuss any questions you have with your health care provider.

## 2017-03-20 ENCOUNTER — Ambulatory Visit (INDEPENDENT_AMBULATORY_CARE_PROVIDER_SITE_OTHER): Payer: BC Managed Care – PPO | Admitting: Women's Health

## 2017-03-20 ENCOUNTER — Ambulatory Visit (INDEPENDENT_AMBULATORY_CARE_PROVIDER_SITE_OTHER): Payer: BC Managed Care – PPO

## 2017-03-20 ENCOUNTER — Encounter: Payer: Self-pay | Admitting: Women's Health

## 2017-03-20 VITALS — BP 118/60 | HR 92 | Wt 229.0 lb

## 2017-03-20 DIAGNOSIS — Z331 Pregnant state, incidental: Secondary | ICD-10-CM

## 2017-03-20 DIAGNOSIS — O403XX0 Polyhydramnios, third trimester, not applicable or unspecified: Secondary | ICD-10-CM

## 2017-03-20 DIAGNOSIS — Z1389 Encounter for screening for other disorder: Secondary | ICD-10-CM

## 2017-03-20 DIAGNOSIS — O0990 Supervision of high risk pregnancy, unspecified, unspecified trimester: Secondary | ICD-10-CM

## 2017-03-20 DIAGNOSIS — Z3A32 32 weeks gestation of pregnancy: Secondary | ICD-10-CM

## 2017-03-20 DIAGNOSIS — O0993 Supervision of high risk pregnancy, unspecified, third trimester: Secondary | ICD-10-CM

## 2017-03-20 DIAGNOSIS — O401XX0 Polyhydramnios, first trimester, not applicable or unspecified: Secondary | ICD-10-CM

## 2017-03-20 LAB — POCT URINALYSIS DIPSTICK
Blood, UA: NEGATIVE
Glucose, UA: NEGATIVE
Ketones, UA: NEGATIVE
LEUKOCYTES UA: NEGATIVE
NITRITE UA: NEGATIVE
PROTEIN UA: NEGATIVE

## 2017-03-20 NOTE — Patient Instructions (Signed)
Julia Rangel, I greatly value your feedback.  If you receive a survey following your visit with [place] today, we appreciate you taking the time to fill it out.  Thanks, Knute Neu, CNM, WHNP-BC   Call the office [phone]) or go to [facility] if:  You begin to have strong, frequent contractions  Your water breaks.  Sometimes it is a big gush of fluid, sometimes it is just a trickle that keeps getting your panties wet or running down your legs  You have vaginal bleeding.  It is normal to have a small amount of spotting if your cervix was checked.   You don't feel your baby moving like normal.  If you don't, get you something to eat and drink and lay down and focus on feeling your baby move.  You should feel at least 10 movements in 2 hours.  If you don't, you should call the office or go to [facility].    Tdap Vaccine  It is recommended that you get the Tdap vaccine during the third trimester of EACH pregnancy to help protect your baby from getting pertussis (whooping cough)  27-36 weeks is the BEST time to do this so that you can pass the protection on to your baby. During pregnancy is better than after pregnancy, but if you are unable to get it during pregnancy it will be offered at the hospital.   You can get this vaccine at the health department or your family doctor  Everyone who will be around your baby should also be up-to-date on their vaccines. Adults (who are not pregnant) only need 1 dose of Tdap during adulthood.   Third Trimester of Pregnancy The third trimester is from week 29 through week 42, months 7 through 9. The third trimester is a time when the fetus is growing rapidly. At the end of the ninth month, the fetus is about 20 inches in length and weighs 6-10 pounds.  BODY CHANGES Your body goes through many changes during pregnancy. The changes vary from woman to woman.   Your weight will continue to increase. You can expect to gain 25-35 pounds (11-16 kg) by the  end of the pregnancy.  You may begin to get stretch marks on your hips, abdomen, and breasts.  You may urinate more often because the fetus is moving lower into your pelvis and pressing on your bladder.  You may develop or continue to have heartburn as a result of your pregnancy.  You may develop constipation because certain hormones are causing the muscles that push waste through your intestines to slow down.  You may develop hemorrhoids or swollen, bulging veins (varicose veins).  You may have pelvic pain because of the weight gain and pregnancy hormones relaxing your joints between the bones in your pelvis. Backaches may result from overexertion of the muscles supporting your posture.  You may have changes in your hair. These can include thickening of your hair, rapid growth, and changes in texture. Some women also have hair loss during or after pregnancy, or hair that feels dry or thin. Your hair will most likely return to normal after your baby is born.  Your breasts will continue to grow and be tender. A yellow discharge may leak from your breasts called colostrum.  Your belly button may stick out.  You may feel short of breath because of your expanding uterus.  You may notice the fetus "dropping," or moving lower in your abdomen.  You may have a bloody mucus  discharge. This usually occurs a few days to a week before labor begins.  Your cervix becomes thin and soft (effaced) near your due date. WHAT TO EXPECT AT YOUR PRENATAL EXAMS  You will have prenatal exams every 2 weeks until week 36. Then, you will have weekly prenatal exams. During a routine prenatal visit:  You will be weighed to make sure you and the fetus are growing normally.  Your blood pressure is taken.  Your abdomen will be measured to track your baby's growth.  The fetal heartbeat will be listened to.  Any test results from the previous visit will be discussed.  You may have a cervical check near your due  date to see if you have effaced. At around 36 weeks, your caregiver will check your cervix. At the same time, your caregiver will also perform a test on the secretions of the vaginal tissue. This test is to determine if a type of bacteria, Group B streptococcus, is present. Your caregiver will explain this further. Your caregiver may ask you:  What your birth plan is.  How you are feeling.  If you are feeling the baby move.  If you have had any abnormal symptoms, such as leaking fluid, bleeding, severe headaches, or abdominal cramping.  If you have any questions. Other tests or screenings that may be performed during your third trimester include:  Blood tests that check for low iron levels (anemia).  Fetal testing to check the health, activity level, and growth of the fetus. Testing is done if you have certain medical conditions or if there are problems during the pregnancy. FALSE LABOR You may feel small, irregular contractions that eventually go away. These are called Braxton Hicks contractions, or false labor. Contractions may last for hours, days, or even weeks before true labor sets in. If contractions come at regular intervals, intensify, or become painful, it is best to be seen by your caregiver.  SIGNS OF LABOR   Menstrual-like cramps.  Contractions that are 5 minutes apart or less.  Contractions that start on the top of the uterus and spread down to the lower abdomen and back.  A sense of increased pelvic pressure or back pain.  A watery or bloody mucus discharge that comes from the vagina. If you have any of these signs before the 37th week of pregnancy, call your caregiver right away. You need to go to the hospital to get checked immediately. HOME CARE INSTRUCTIONS   Avoid all smoking, herbs, alcohol, and unprescribed drugs. These chemicals affect the formation and growth of the baby.  Follow your caregiver's instructions regarding medicine use. There are medicines that  are either safe or unsafe to take during pregnancy.  Exercise only as directed by your caregiver. Experiencing uterine cramps is a good sign to stop exercising.  Continue to eat regular, healthy meals.  Wear a good support bra for breast tenderness.  Do not use hot tubs, steam rooms, or saunas.  Wear your seat belt at all times when driving.  Avoid raw meat, uncooked cheese, cat litter boxes, and soil used by cats. These carry germs that can cause birth defects in the baby.  Take your prenatal vitamins.  Try taking a stool softener (if your caregiver approves) if you develop constipation. Eat more high-fiber foods, such as fresh vegetables or fruit and whole grains. Drink plenty of fluids to keep your urine clear or pale yellow.  Take warm sitz baths to soothe any pain or discomfort caused by hemorrhoids. Use  hemorrhoid cream if your caregiver approves.  If you develop varicose veins, wear support hose. Elevate your feet for 15 minutes, 3-4 times a day. Limit salt in your diet.  Avoid heavy lifting, wear low heal shoes, and practice good posture.  Rest a lot with your legs elevated if you have leg cramps or low back pain.  Visit your dentist if you have not gone during your pregnancy. Use a soft toothbrush to brush your teeth and be gentle when you floss.  A sexual relationship may be continued unless your caregiver directs you otherwise.  Do not travel far distances unless it is absolutely necessary and only with the approval of your caregiver.  Take prenatal classes to understand, practice, and ask questions about the labor and delivery.  Make a trial run to the hospital.  Pack your hospital bag.  Prepare the baby's nursery.  Continue to go to all your prenatal visits as directed by your caregiver. SEEK MEDICAL CARE IF:  You are unsure if you are in labor or if your water has broken.  You have dizziness.  You have mild pelvic cramps, pelvic pressure, or nagging pain  in your abdominal area.  You have persistent nausea, vomiting, or diarrhea.  You have a bad smelling vaginal discharge.  You have pain with urination. SEEK IMMEDIATE MEDICAL CARE IF:   You have a fever.  You are leaking fluid from your vagina.  You have spotting or bleeding from your vagina.  You have severe abdominal cramping or pain.  You have rapid weight loss or gain.  You have shortness of breath with chest pain.  You notice sudden or extreme swelling of your face, hands, ankles, feet, or legs.  You have not felt your baby move in over an hour.  You have severe headaches that do not go away with medicine.  You have vision changes. Document Released: 04/17/2001 Document Revised: 04/28/2013 Document Reviewed: 06/24/2012 [facility] Patient Information 2015 [place], [place]. This information is not intended to replace advice given to you by your health care provider. Make sure you discuss any questions you have with your health care provider.

## 2017-03-20 NOTE — Progress Notes (Signed)
[place] 32 wks,cephalic,BPP 5/1,VOH 26 cm, polyhydramnios,ant pl gr 1,normal ovaries bilat,fhr 131 bpm

## 2017-03-20 NOTE — Progress Notes (Signed)
   HIGH-RISK PREGNANCY VISIT Patient name: Julia Rangel MRN [id number]  Date of birth: Oct 01, 1985 Chief Complaint:   Routine Prenatal Visit  History of Present Illness:   Julia Rangel is a 31 y.o. G16P2002 female at [redacted]w[redacted]d with an Estimated Date of Delivery: 05/15/17 being seen today for ongoing management of a high-risk pregnancy complicated by polyhydramnios and suspected LGA, normal 2hr GTT. Got Tdap since last visit Today she reports no complaints. Contractions: Irregular. Vag. Bleeding: None.  Movement: Present. denies leaking of fluid.  Review of Systems:   Pertinent items are noted in HPI Denies abnormal vaginal discharge w/ itching/odor/irritation, headaches, visual changes, shortness of breath, chest pain, abdominal pain, severe nausea/vomiting, or problems with urination or bowel movements unless otherwise stated above. Pertinent History Reviewed:  Reviewed past medical,surgical, social, obstetrical and family history.  Reviewed problem list, medications and allergies. Physical Assessment:   Vitals:   03/20/17 1103  BP: 118/60  Pulse: 92  Weight: 229 lb (103.9 kg)  Body mass index is 36.96 kg/m.           Physical Examination:   General appearance: alert, well appearing, and in no distress  Mental status: alert, oriented to person, place, and time  Skin: warm & dry   Extremities: Edema: Trace    Cardiovascular: normal heart rate noted  Respiratory: normal respiratory effort, no distress  Abdomen: gravid, soft, non-tender  Pelvic: Cervical exam deferred         Fetal Status: Fetal Heart Rate (bpm): 131 u/s Fundal Height: 35 cm Movement: Present    Fetal Surveillance Testing today: [place] 32 wks,cephalic,BPP 7/6,PPJ 26 cm, polyhydramnios,ant pl gr 1,normal ovaries bilat,fhr 131 bpm  Results for orders placed or performed in visit on 03/20/17 (from the past 24 hour(s))  POCT Urinalysis Dipstick   Collection Time: 03/20/17 11:07 AM  Result Value Ref Range   Color, UA     Clarity, UA     Glucose, UA neg    Bilirubin, UA     Ketones, UA neg    Spec Grav, UA  1.010 - 1.025   Blood, UA neg    pH, UA  5.0 - 8.0   Protein, UA neg    Urobilinogen, UA  0.2 or 1.0 E.U./dL   Nitrite, UA neg    Leukocytes, UA Negative Negative    Assessment & Plan:  1) High-risk pregnancy G3P2002 at [redacted]w[redacted]d with an Estimated Date of Delivery: 05/15/17   2) Polyhydramnios, stable, AFI 26cm today, BPP 8/8  3) Suspected LGA, discussed per JVF considering possible repeat of 2hr GTT at 34wks- to discuss more w/ him next week  Labs/procedures today: bpp   Treatment Plan:  Weekly BPP, RCS w/ BTL @ 39wks or earlier if indicated  Reviewed: Preterm labor symptoms and general obstetric precautions including but not limited to vaginal bleeding, contractions, leaking of fluid and fetal movement were reviewed in detail with the patient.  All questions were answered.  Follow-up: Return for As scheduled wed for hrob/bpp.  Orders Placed This Encounter  Procedures  . POCT Urinalysis Dipstick   Tawnya Crook CNM, [facility] 03/20/2017 11:34 AM

## 2017-03-21 ENCOUNTER — Encounter: Payer: Self-pay | Admitting: Women's Health

## 2017-03-22 ENCOUNTER — Other Ambulatory Visit: Payer: Self-pay | Admitting: Women's Health

## 2017-03-22 NOTE — Progress Notes (Signed)
Called in glucometer/strips/lancets to [facility] per pt request. Is going to check QID d/t LGA, doesn't want to repeat 2hr GTT Roma Schanz, CNM, WHNP-BC 03/22/2017 1:25 PM

## 2017-03-27 ENCOUNTER — Encounter: Payer: Self-pay | Admitting: *Deleted

## 2017-03-27 ENCOUNTER — Ambulatory Visit (INDEPENDENT_AMBULATORY_CARE_PROVIDER_SITE_OTHER): Payer: BC Managed Care – PPO

## 2017-03-27 ENCOUNTER — Ambulatory Visit (INDEPENDENT_AMBULATORY_CARE_PROVIDER_SITE_OTHER): Payer: BC Managed Care – PPO | Admitting: Obstetrics and Gynecology

## 2017-03-27 VITALS — BP 118/66 | HR 94 | Wt 229.4 lb

## 2017-03-27 DIAGNOSIS — O401XX0 Polyhydramnios, first trimester, not applicable or unspecified: Secondary | ICD-10-CM

## 2017-03-27 DIAGNOSIS — O403XX0 Polyhydramnios, third trimester, not applicable or unspecified: Secondary | ICD-10-CM | POA: Diagnosis not present

## 2017-03-27 DIAGNOSIS — O0990 Supervision of high risk pregnancy, unspecified, unspecified trimester: Secondary | ICD-10-CM

## 2017-03-27 DIAGNOSIS — Z331 Pregnant state, incidental: Secondary | ICD-10-CM

## 2017-03-27 DIAGNOSIS — O34219 Maternal care for unspecified type scar from previous cesarean delivery: Secondary | ICD-10-CM

## 2017-03-27 DIAGNOSIS — Z98891 History of uterine scar from previous surgery: Secondary | ICD-10-CM

## 2017-03-27 DIAGNOSIS — Z3A33 33 weeks gestation of pregnancy: Secondary | ICD-10-CM

## 2017-03-27 DIAGNOSIS — Z1389 Encounter for screening for other disorder: Secondary | ICD-10-CM

## 2017-03-27 LAB — POCT URINALYSIS DIPSTICK
GLUCOSE UA: NEGATIVE
Ketones, UA: NEGATIVE
Leukocytes, UA: NEGATIVE
NITRITE UA: NEGATIVE
PROTEIN UA: NEGATIVE
RBC UA: NEGATIVE

## 2017-03-27 NOTE — Progress Notes (Signed)
[place] 33 wks,cephalic,ant pl gr 1,AFI 28 cm (poly),fhr 174 bpm,BPP 8/8

## 2017-03-27 NOTE — Progress Notes (Signed)
Julia Rangel is a 31 y.o. female High Risk Pregnancy HROB Diagnosis(es): complicated by polyhydramnios and suspected LGA,   [id number] [redacted]w[redacted]d Estimated Date of Delivery: 05/15/17    HPI: The patient is being seen today for ongoing management of as above. Today she reports no complaints. Pt brought in a recording of her BGLs: fasting is from 78-83. After meals are 143, 129, 146. In four days, she has had 25% slightly elevated BGLs. Her previous babies weighed 8lb 3oz, and 8lb 1 oz. This will be her third C-section. She wants a BTL.  Patient reports good fetal movement, denies any bleeding and no rupture of membranes symptoms or regular contractions.   BP weight and urine results reviewed and noted. Blood pressure 118/66, pulse 94, weight 229 lb 6.4 oz (104.1 kg), last menstrual period 08/08/2016.  Fundal Height:  35 Fetal Heart rate:  174 Physical Examination: Abdomen - soft, nontender, nondistended, no masses or organomegaly                                     Pelvic - Not indicated                                     Edema: None  Urinalysis:NEGATIVE  Fetal Surveillance Testing today:   BPP: [place] 33 wks,cephalic,ant pl gr 1,AFI 28 cm (poly),fhr 174 bpm,BPP 8/8  Lab and sonogram results have been reviewed.  Assessment:  1.  Pregnancy at [redacted]w[redacted]d,  G3P2002   :  Estimated Date of Delivery: 05/15/17                        2.  Polyhydramnios                        3. Suspected LGA  Medication(s) Plans:  None  Treatment Plan:  Weekly BPP, RC/S w/ BTL @ 39wks (05/08/17) or earlier if indicated by change in condition.  Follow up in 1 week for appointment for high risk OB care, BPP    By signing my name below, I, Izna Ahmed, attest that this documentation has been prepared under the direction and in the presence of Jonnie Kind, MD. Electronically Signed: Jabier Gauss, Medical Scribe. 03/27/17. 2:37 PM.  I personally performed the services described in this documentation, which was SCRIBED in  my presence. The recorded information has been reviewed and considered accurate. It has been edited as necessary during review. Jonnie Kind, MD

## 2017-04-03 ENCOUNTER — Other Ambulatory Visit: Payer: Self-pay | Admitting: Obstetrics and Gynecology

## 2017-04-03 DIAGNOSIS — O403XX0 Polyhydramnios, third trimester, not applicable or unspecified: Secondary | ICD-10-CM

## 2017-04-04 ENCOUNTER — Encounter: Payer: Self-pay | Admitting: Obstetrics and Gynecology

## 2017-04-04 ENCOUNTER — Other Ambulatory Visit: Payer: Self-pay

## 2017-04-04 ENCOUNTER — Ambulatory Visit (INDEPENDENT_AMBULATORY_CARE_PROVIDER_SITE_OTHER): Payer: BC Managed Care – PPO | Admitting: Obstetrics and Gynecology

## 2017-04-04 ENCOUNTER — Ambulatory Visit (INDEPENDENT_AMBULATORY_CARE_PROVIDER_SITE_OTHER): Payer: BC Managed Care – PPO

## 2017-04-04 VITALS — BP 114/72 | HR 98 | Wt 234.0 lb

## 2017-04-04 DIAGNOSIS — Z3A34 34 weeks gestation of pregnancy: Secondary | ICD-10-CM

## 2017-04-04 DIAGNOSIS — O0993 Supervision of high risk pregnancy, unspecified, third trimester: Secondary | ICD-10-CM

## 2017-04-04 DIAGNOSIS — O0990 Supervision of high risk pregnancy, unspecified, unspecified trimester: Secondary | ICD-10-CM

## 2017-04-04 DIAGNOSIS — O401XX0 Polyhydramnios, first trimester, not applicable or unspecified: Secondary | ICD-10-CM

## 2017-04-04 DIAGNOSIS — Z1389 Encounter for screening for other disorder: Secondary | ICD-10-CM

## 2017-04-04 DIAGNOSIS — O403XX0 Polyhydramnios, third trimester, not applicable or unspecified: Secondary | ICD-10-CM

## 2017-04-04 DIAGNOSIS — O34219 Maternal care for unspecified type scar from previous cesarean delivery: Secondary | ICD-10-CM

## 2017-04-04 DIAGNOSIS — Z331 Pregnant state, incidental: Secondary | ICD-10-CM

## 2017-04-04 LAB — POCT URINALYSIS DIPSTICK
Glucose, UA: NEGATIVE
Ketones, UA: NEGATIVE
LEUKOCYTES UA: NEGATIVE
NITRITE UA: NEGATIVE
Protein, UA: NEGATIVE
RBC UA: NEGATIVE

## 2017-04-04 MED ORDER — METRONIDAZOLE 0.75 % VA GEL: 1.0000 | Freq: Every day | VAGINAL | 1 refills | Status: DC

## 2017-04-04 NOTE — Progress Notes (Signed)
[place] 88+8 wks,cephalic,BPP 8/8, FHR 757 bpm,ant pl gr 1,AFI 28 cm,

## 2017-04-04 NOTE — Progress Notes (Signed)
Julia Rangel is a 31 y.o. female High Risk Pregnancy HROB Diagnosis(es):   complicatedbypolyhydramnios and suspected LGA,   [id number] [redacted]w[redacted]d Estimated Date of Delivery: 05/15/17    HPI: The patient is being seen today for ongoing management of complicatedbypolyhydramnios and suspected LGA,  . Today she reports that she has been leaking a normal amount of fluid. She states her panties have been wet for the past few days, however she is unsure if it is sweat or vaginal secretions.   Patient reports good fetal movement, denies any bleeding and no rupture of membranes symptoms or regular contractions.   BP weight and urine results reviewed and noted. Blood pressure 114/72, pulse 98, weight 234 lb (106.1 kg), last menstrual period 08/08/2016.  Fundal Height:  35 Fetal Heart rate:  132 Physical Examination: Abdomen - soft, nontender, nondistended, no masses or organomegaly                                     Pelvic - slight vaginal discharge present                                     Edema:  None Wet Prep: Negative KOH: some white cells, negative  Urinalysis: negative  Fetal Surveillance Testing today:  BPP 8/8  Lab and sonogram results have been reviewed.  Assessment:  1.  Pregnancy at [redacted]w[redacted]d,  [id number]   : Estimated Date of Delivery: 05/15/17                        2.  Polyhydramnios   3. Suspected LGA                        4 bv  Medication(s) Plans:  none  Treatment Plan:  Weekly BPP, RC/S w/ BTL @ 39wks (05/08/17) or earlier if indicated by change in condition. 1. Prescribe Metrogel  Follow up in 1 week for appointment for high risk OB care, BPP   By signing my name below, I, Izna Ahmed, attest that this documentation has been prepared under the direction and in the presence of Jonnie Kind., MD. Electronically Signed: Jabier Gauss, Medical Scribe. 04/04/17. 4:32 PM.  I personally performed the services described in this documentation, which was SCRIBED in my presence. The  recorded information has been reviewed and considered accurate. It has been edited as necessary during review. Jonnie Kind, MD

## 2017-04-09 ENCOUNTER — Other Ambulatory Visit: Payer: Self-pay | Admitting: Obstetrics and Gynecology

## 2017-04-09 DIAGNOSIS — O403XX0 Polyhydramnios, third trimester, not applicable or unspecified: Secondary | ICD-10-CM

## 2017-04-10 ENCOUNTER — Ambulatory Visit (INDEPENDENT_AMBULATORY_CARE_PROVIDER_SITE_OTHER): Payer: BC Managed Care – PPO | Admitting: Obstetrics and Gynecology

## 2017-04-10 ENCOUNTER — Ambulatory Visit (INDEPENDENT_AMBULATORY_CARE_PROVIDER_SITE_OTHER): Payer: BC Managed Care – PPO

## 2017-04-10 ENCOUNTER — Encounter: Payer: Self-pay | Admitting: Obstetrics and Gynecology

## 2017-04-10 VITALS — BP 122/68 | HR 102 | Wt 235.6 lb

## 2017-04-10 DIAGNOSIS — Z3A35 35 weeks gestation of pregnancy: Secondary | ICD-10-CM

## 2017-04-10 DIAGNOSIS — Z331 Pregnant state, incidental: Secondary | ICD-10-CM

## 2017-04-10 DIAGNOSIS — O403XX0 Polyhydramnios, third trimester, not applicable or unspecified: Secondary | ICD-10-CM

## 2017-04-10 DIAGNOSIS — O0990 Supervision of high risk pregnancy, unspecified, unspecified trimester: Secondary | ICD-10-CM

## 2017-04-10 DIAGNOSIS — Z1389 Encounter for screening for other disorder: Secondary | ICD-10-CM

## 2017-04-10 LAB — POCT URINALYSIS DIPSTICK
GLUCOSE UA: NEGATIVE
KETONES UA: NEGATIVE
Leukocytes, UA: NEGATIVE
NITRITE UA: NEGATIVE
Protein, UA: NEGATIVE

## 2017-04-10 MED ORDER — METFORMIN HCL 500 MG PO TABS: 500.0000 mg | ORAL_TABLET | Freq: Every day | ORAL | 2 refills | Status: DC

## 2017-04-10 NOTE — Progress Notes (Signed)
[place] 35 wks,cephalic,BPP 4/9,JSU 199 bpm,ant pl gr 1,afi 25 cm polyhydramnios

## 2017-04-10 NOTE — Progress Notes (Signed)
Patient ID: Julia Rangel, female   DOB: 12-Oct-1985, 31 y.o.   MRN: [id number] Fetal Surveillance Testing today:  U/S   High Risk Pregnancy Diagnosis(es):   Polyhydramnios and suspected LGA  G3P2002 [redacted]w[redacted]d Estimated Date of Delivery: 05/15/17  Blood pressure 122/68, pulse (!) 102, weight 235 lb 9.6 oz (106.9 kg), last menstrual period 08/08/2016.  Urinalysis: Positive for 2+ blood, otherwise negative   HPI: The patient is being seen today for ongoing management of Polyhydramnios and suspected LGA. Today she reports eating one piece of pizza and notes her blood sugars were increased, 140. She has been checking her blood sugars as prescribed. She notes when she wakes up it is normal, but lately after dinner it will be elevated.   BP weight and urine results all reviewed and noted. Patient reports good fetal movement, denies any bleeding and no rupture of membranes symptoms or regular contractions.  Fundal Height:  37 cm Fetal Heart rate:  120 per U/S Edema:   Ultrasound today shows an AFI of 25, down from 28 Patient is without complaints other than noted in her HPI. All questions were answered.  All lab and sonogram results have been reviewed.  Assessment:  1.  Pregnancy at [redacted]w[redacted]d,  Estimated Date of Delivery: 05/15/17 :                        2.  Polyhydramnios                         3.  Suspected LGA  Medication(s) Plans:  Metformin 500 mg  37 Treatment Plan: Add metformin 500 mg every morning Return for HROB, U/S: BPP. for appointment for high risk OB care.  Next week ultrasound will include estimated fetal weight  No orders of the defined types were placed in this encounter.  Orders Placed This Encounter  Procedures  . POCT urinalysis dipstick   By signing my name below, I, Margit Banda, attest that this documentation has been prepared under the direction and in the presence of Jonnie Kind, MD. Electronically Signed: Margit Banda, Medical Scribe. 04/10/17. 4:38  PM.  I personally performed the services described in this documentation, which was SCRIBED in my presence. The recorded information has been reviewed and considered accurate. It has been edited as necessary during review. Jonnie Kind, MD

## 2017-04-16 ENCOUNTER — Other Ambulatory Visit: Payer: Self-pay | Admitting: Obstetrics and Gynecology

## 2017-04-16 DIAGNOSIS — O403XX0 Polyhydramnios, third trimester, not applicable or unspecified: Secondary | ICD-10-CM

## 2017-04-17 ENCOUNTER — Ambulatory Visit (INDEPENDENT_AMBULATORY_CARE_PROVIDER_SITE_OTHER): Payer: BC Managed Care – PPO

## 2017-04-17 ENCOUNTER — Ambulatory Visit (INDEPENDENT_AMBULATORY_CARE_PROVIDER_SITE_OTHER): Payer: BC Managed Care – PPO | Admitting: Women's Health

## 2017-04-17 ENCOUNTER — Encounter: Payer: Self-pay | Admitting: Women's Health

## 2017-04-17 ENCOUNTER — Other Ambulatory Visit: Payer: Self-pay | Admitting: Women's Health

## 2017-04-17 VITALS — BP 110/70 | HR 88 | Wt 237.0 lb

## 2017-04-17 DIAGNOSIS — O0993 Supervision of high risk pregnancy, unspecified, third trimester: Secondary | ICD-10-CM

## 2017-04-17 DIAGNOSIS — O0990 Supervision of high risk pregnancy, unspecified, unspecified trimester: Secondary | ICD-10-CM

## 2017-04-17 DIAGNOSIS — Z331 Pregnant state, incidental: Secondary | ICD-10-CM

## 2017-04-17 DIAGNOSIS — O403XX0 Polyhydramnios, third trimester, not applicable or unspecified: Secondary | ICD-10-CM

## 2017-04-17 DIAGNOSIS — O401XX0 Polyhydramnios, first trimester, not applicable or unspecified: Secondary | ICD-10-CM

## 2017-04-17 DIAGNOSIS — Z1389 Encounter for screening for other disorder: Secondary | ICD-10-CM

## 2017-04-17 DIAGNOSIS — O24419 Gestational diabetes mellitus in pregnancy, unspecified control: Secondary | ICD-10-CM

## 2017-04-17 DIAGNOSIS — Z3A36 36 weeks gestation of pregnancy: Secondary | ICD-10-CM | POA: Diagnosis not present

## 2017-04-17 LAB — POCT URINALYSIS DIPSTICK
GLUCOSE UA: NEGATIVE
Ketones, UA: NEGATIVE
LEUKOCYTES UA: NEGATIVE
Nitrite, UA: NEGATIVE
Protein, UA: NEGATIVE

## 2017-04-17 MED ORDER — GLYBURIDE 2.5 MG PO TABS: 2.5000 mg | ORAL_TABLET | Freq: Every day | ORAL | 1 refills | Status: DC

## 2017-04-17 NOTE — Progress Notes (Signed)
HIGH-RISK PREGNANCY VISIT Patient name: Julia Rangel MRN [id number]  Date of birth: [birth year] Chief Complaint:   Routine Prenatal Visit (U/S)  History of Present Illness:   Julia Rangel is a 31 y.o. G3P2002 female at [redacted]w[redacted]d with an Estimated Date of Delivery: 05/15/17 being seen today for ongoing management of a high-risk pregnancy complicated by F6OZ dx @ 35wks, polyhydramnios- resolved today. Passed 2hr GTT @ 27wks, however developed polyhydramnios/LGA @ 30wks. Instead of repeating 2hr GTT, pt wanted to check sugars QID, sugars were high, so was started on metformin 500mg  AM at last visit. States she stopped it a few days ago d/t bad diarrhea.  Today she reports all fbs <90, most of 2hr pp >120 (120-148). Contractions: Irregular. Vag. Bleeding: None.  Movement: Present. denies leaking of fluid.  Review of Systems:   Pertinent items are noted in HPI Denies abnormal vaginal discharge w/ itching/odor/irritation, headaches, visual changes, shortness of breath, chest pain, abdominal pain, severe nausea/vomiting, or problems with urination or bowel movements unless otherwise stated above. Pertinent History Reviewed:  Reviewed past medical,surgical, social, obstetrical and family history.  Reviewed problem list, medications and allergies. Physical Assessment:   Vitals:   04/17/17 1619  BP: 110/70  Pulse: 88  Weight: 237 lb (107.5 kg)  Body mass index is 38.25 kg/m.           Physical Examination:   General appearance: alert, well appearing, and in no distress  Mental status: alert, oriented to person, place, and time  Skin: warm & dry   Extremities: Edema: Trace    Cardiovascular: normal heart rate noted  Respiratory: normal respiratory effort, no distress  Abdomen: gravid, soft, non-tender  Pelvic: Dilation: 1 Effacement (%): Thick Station: Ballotable  Fetal Status: Fetal Heart Rate (bpm): 112 u/s Fundal Height: 36 cm Movement: Present Presentation: Vertex  Fetal Surveillance Testing  today: [place] 36 wks,cephalic,anterior pl gr 3,afi 24 cm,fhr 112 bpm,BPP 8/8,EFW 3475 g 88%  Results for orders placed or performed in visit on 04/17/17 (from the past 24 hour(s))  POCT urinalysis dipstick   Collection Time: 04/17/17  4:24 PM  Result Value Ref Range   Color, UA     Clarity, UA     Glucose, UA neg    Bilirubin, UA     Ketones, UA neg    Spec Grav, UA  1.010 - 1.025   Blood, UA trace    pH, UA  5.0 - 8.0   Protein, UA neg    Urobilinogen, UA  0.2 or 1.0 E.U./dL   Nitrite, UA neg    Leukocytes, UA Negative Negative   Appearance     Odor      Assessment & Plan:  1) High-risk pregnancy G3P2002 at [redacted]w[redacted]d with an Estimated Date of Delivery: 05/15/17   2) A2DM, dx @ 35wks, stopped metformin d/t diarrhea, 2hr pp still high, Rx glyburide 2.5mg  AM. Bring log to next appt. Referral to dietician ordered today. Discussed decreasing carbs/increasing protein.   3) Polyhydramnios, resolved on today' u/s, AFI 24cm  Labs/procedures today: u/s, gbs, gc/ct, sve  Treatment Plan:  2x/wk testing           Deliver @ 39wks (has RCS w/ BTL scheduled 1/2)  Reviewed: Preterm labor symptoms and general obstetric precautions including but not limited to vaginal bleeding, contractions, leaking of fluid and fetal movement were reviewed in detail with the patient.  All questions were answered.  Follow-up: Return for mon/thur for hrob/nst x 2wks.  Orders Placed This Encounter  Procedures  . Culture, beta strep (group b only)  . GC/Chlamydia Probe Amp  . Referral to Nutrition and Diabetes Services  . POCT urinalysis dipstick   Tawnya Crook CNM, [facility] 04/17/2017 5:04 PM

## 2017-04-17 NOTE — Progress Notes (Signed)
[place] 36 wks,cephalic,anterior pl gr 3,afi 24 cm,fhr 112 bpm,BPP 8/8,EFW 3475 g 88%

## 2017-04-17 NOTE — Patient Instructions (Signed)
Julia Rangel, I greatly value your feedback.  If you receive a survey following your visit with [place] today, we appreciate you taking the time to fill it out.  Thanks, Knute Neu, CNM, WHNP-BC   Call the office [phone]) or go to [facility] if:  You begin to have strong, frequent contractions  Your water breaks.  Sometimes it is a big gush of fluid, sometimes it is just a trickle that keeps getting your panties wet or running down your legs  You have vaginal bleeding.  It is normal to have a small amount of spotting if your cervix was checked.   You don't feel your baby moving like normal.  If you don't, get you something to eat and drink and lay down and focus on feeling your baby move.  You should feel at least 10 movements in 2 hours.  If you don't, you should call the office or go to [facility].     Gestational Diabetes Mellitus, Self Care When you have gestational diabetes (gestational diabetes mellitus), you must keep your blood sugar (glucose) under control. You can do this with:  Nutrition.  Exercise.  Lifestyle changes.  Medicines or insulin, if needed.  Support from your doctors and others.  How do I manage my blood sugar?  Check your blood sugar every day during pregnancy. Check it as often as told.  Call your doctor if your blood sugar is above your goal numbers for 2 tests in a row. Your doctor will set treatment goals for you. Try to have these blood sugars:  After not eating for a long time (after fasting): at or below 95 mg/dL (5.3 mmol/L).  After meals (postprandial): ? One hour after a meal: at or below 140 mg/dL (7.8 mmol/L). ? Two hours after a meal: at or below 120 mg/dL (6.7 mmol/L).  A1c (hemoglobin A1c) level: 6-6.5%.  What do I need to know about high blood sugar? High blood sugar is called hyperglycemia. Know the early signs of high blood sugar. Signs include:  Feeling: ? Thirsty. ? Hungry. ? Very tired.  Needing to pee (urinate)  more than usual.  Blurry vision.  What do I need to know about low blood sugar? Low blood sugar is called hypoglycemia. This is when blood sugar is at or below 70 mg/dL (3.9 mmol/L). Symptoms may include:  Feeling: ? Hungry. ? Worried or nervous (anxious). ? Sweaty or clammy. ? Confused. ? Dizzy. ? Sleepy. ? Sick to your stomach (nauseous).  Having: ? A fast heartbeat. ? A headache. ? A change in vision. ? Jerky movements that you cannot control (seizure). ? Nightmares. ? Tingling or no feeling (numbness) around the mouth, lips, or tongue.  Having trouble with: ? Talking. ? Paying attention (concentrating). ? Moving (coordination). ? Sleeping.  Shaking.  Passing out (fainting).  Getting upset easily (irritability).  Treating low blood sugar  To treat low blood sugar, eat or drink something sugary right away. If you can think clearly and swallow safely, follow the 15:15 rule:  Take 15 grams of a fast-acting carb (carbohydrate). Some fast-acting carbs are: ? 1 tube of glucose gel. ? 3 sugar tablets (glucose pills). ? 6-8 pieces of hard candy. ? 4 oz (120 mL) of fruit juice. ? 4 oz (120 mL) regular (not diet) soda.  Check your blood sugar 15 minutes after you take the carb.  If your blood sugar is still at or below 70 mg/dL (3.9 mmol/L), take 15 grams of a carb  again.  If your blood sugar does not go above 70 mg/dL (3.9 mmol/L) after 3 tries, get help right away.  After your blood sugar goes back to normal, eat a meal or a snack within 1 hour.  Treating very low blood sugar If your blood sugar is at or below 54 mg/dL (3 mmol/L), you have very low blood sugar (severe hypoglycemia). This is an emergency. Do not wait to see if the symptoms will go away. Get medical help right away. Call your local emergency services (911 in the [place] Do not drive yourself to the hospital. If you have very low blood sugar and you cannot eat or drink, you may need a glucagon shot  (injection). A family member or friend should learn:  How to check your blood sugar.  How to give you a glucagon shot.  Ask your doctor if you need a glucagon shot kit at home. What else is important to manage my diabetes? Medicine  Take your insulin and diabetes medicines as told.  Do not run out of insulin or medicines.  Adjust your insulin and diabetes medicines as told. Food   Make healthy food choices. These include: ? Chicken, fish, egg whites, and beans. ? Oats, whole wheat, bulgur, brown rice, quinoa, and millet. ? Fresh fruits and vegetables. ? Low-fat dairy products. ? Nuts, avocado, olive oil, and canola oil.  Make an eating plan. A food specialist (dietitian) can help you.  Follow instructions from your doctor about what you cannot eat or drink.  Drink enough fluid to keep your pee (urine) clear or pale yellow.  Eat healthy snacks between healthy meals.  Keep track of carbs you eat. Read food labels. Learn about food serving sizes.  Follow your sick day plan when you cannot eat or drink normally. Make this plan with your doctor. Activity  Exercise 30 minutes or more a day or as much as told by your doctor.  Talk with your doctor if you start a new exercise. Your doctor may need to adjust your insulin, medicines, or food. Lifestyle  Do not drink alcohol.  Do not use any tobacco products. If you need help quitting, ask your doctor.  Learn how to deal with stress. If you need help with this, ask your doctor. Body care   Stay up to date with your shots (immunizations).  Brush your teeth and gums two times a day. Floss at least one time a day.  Go to the dentist least one time every 6 months.  Stay at a healthy weight while you are pregnant. General instructions  Take over-the-counter and prescription medicines only as told by your doctor.  Ask your doctor about risks of high blood pressure in pregnancy. These are called preeclampsia and  eclampsia.  Share your diabetes care plan with: ? Your work or school. ? People you live with.  Check your pee for ketones: ? When you are sick. ? As told by your doctor.  Ask your doctor: ? Do I need to meet with a diabetes educator? ? Where can I find a support group for people with diabetes?  Carry a card or wear jewelry that says that you have diabetes.  Keep all follow-up visits with your doctor. This is important. Care after giving birth   Have your blood sugar checked 4-12 weeks after you give birth.  Get checked for diabetes at least every 3 years. Where to find more information: To learn more about diabetes, visit:  American Diabetes Association:  www.diabetes.org/diabetes-basics/gestational  Centers for Disease Control and Prevention (CDC): http://sanchez-watson.com/.pdf  This information is not intended to replace advice given to you by your health care provider. Make sure you discuss any questions you have with your health care provider. Document Released: 08/15/2015 Document Revised: 09/29/2015 Document Reviewed: 05/27/2015 Elsevier Interactive Patient Education  [employer].

## 2017-04-19 ENCOUNTER — Encounter (HOSPITAL_COMMUNITY): Payer: Self-pay

## 2017-04-19 LAB — GC/CHLAMYDIA PROBE AMP
CHLAMYDIA, DNA PROBE: NEGATIVE
Neisseria gonorrhoeae by PCR: NEGATIVE

## 2017-04-21 LAB — CULTURE, BETA STREP (GROUP B ONLY): STREP GP B CULTURE: NEGATIVE

## 2017-04-22 ENCOUNTER — Other Ambulatory Visit: Payer: Self-pay

## 2017-04-22 ENCOUNTER — Encounter: Payer: Self-pay | Admitting: Obstetrics & Gynecology

## 2017-04-22 ENCOUNTER — Ambulatory Visit (INDEPENDENT_AMBULATORY_CARE_PROVIDER_SITE_OTHER): Payer: BC Managed Care – PPO | Admitting: Obstetrics & Gynecology

## 2017-04-22 VITALS — BP 108/62 | HR 96 | Wt 234.0 lb

## 2017-04-22 DIAGNOSIS — Z3A36 36 weeks gestation of pregnancy: Secondary | ICD-10-CM

## 2017-04-22 DIAGNOSIS — O24419 Gestational diabetes mellitus in pregnancy, unspecified control: Secondary | ICD-10-CM

## 2017-04-22 DIAGNOSIS — O403XX0 Polyhydramnios, third trimester, not applicable or unspecified: Secondary | ICD-10-CM | POA: Diagnosis not present

## 2017-04-22 DIAGNOSIS — Z1389 Encounter for screening for other disorder: Secondary | ICD-10-CM

## 2017-04-22 DIAGNOSIS — O0993 Supervision of high risk pregnancy, unspecified, third trimester: Secondary | ICD-10-CM

## 2017-04-22 DIAGNOSIS — Z331 Pregnant state, incidental: Secondary | ICD-10-CM

## 2017-04-22 LAB — POCT URINALYSIS DIPSTICK
GLUCOSE UA: NEGATIVE
Ketones, UA: NEGATIVE
LEUKOCYTES UA: NEGATIVE
NITRITE UA: NEGATIVE
PROTEIN UA: NEGATIVE
RBC UA: NEGATIVE

## 2017-04-22 NOTE — Progress Notes (Signed)
   HIGH-RISK PREGNANCY VISIT Patient name: Julia Rangel MRN [id number]  Date of birth: 04-06-1986 Chief Complaint:   High Risk Gestation (NST)  History of Present Illness:   Julia Rangel is a 31 y.o. G83P2002 female at [redacted]w[redacted]d with an Estimated Date of Delivery: 05/15/17 being seen today for ongoing management of a high-risk pregnancy complicated by gestational DM, class  A2 DM.  Today she reports CBG are a bit low. Contractions: Irregular. Vag. Bleeding: None.  Movement: Present. denies leaking of fluid.  Review of Systems:   Pertinent items are noted in HPI Denies abnormal vaginal discharge w/ itching/odor/irritation, headaches, visual changes, shortness of breath, chest pain, abdominal pain, severe nausea/vomiting, or problems with urination or bowel movements unless otherwise stated above. Pertinent History Reviewed:  Reviewed past medical,surgical, social, obstetrical and family history.  Reviewed problem list, medications and allergies. Physical Assessment:   Vitals:   04/22/17 1527  BP: 108/62  Pulse: 96  Weight: 234 lb (106.1 kg)  Body mass index is 37.77 kg/m.           Physical Examination:   General appearance: alert, well appearing, and in no distress  Mental status: alert, oriented to person, place, and time  Skin: warm & dry   Extremities: Edema: Trace    Cardiovascular: normal heart rate noted  Respiratory: normal respiratory effort, no distress  Abdomen: gravid, soft, non-tender  Pelvic: Cervical exam deferred         Fetal Status:     Movement: Present    Fetal Surveillance Testing today: Reactive NST   Results for orders placed or performed in visit on 04/22/17 (from the past 24 hour(s))  POCT urinalysis dipstick   Collection Time: 04/22/17  3:28 PM  Result Value Ref Range   Color, UA     Clarity, UA     Glucose, UA neg    Bilirubin, UA     Ketones, UA neg    Spec Grav, UA  1.010 - 1.025   Blood, UA neg    pH, UA  5.0 - 8.0   Protein, UA neg    Urobilinogen, UA  0.2 or 1.0 E.U./dL   Nitrite, UA neg    Leukocytes, UA Negative Negative   Appearance     Odor      Assessment & Plan:  1) High-risk pregnancy G3P2002 at [redacted]w[redacted]d with an Estimated Date of Delivery: 05/15/17   2) Class A2 DM, unstable, lower glyburide to 1.25 mg in am  3) Polyhydramnios, stable  Labs/procedures today: Reactive NST  Treatment Plan:  Twice weekly assessments, planned repeat section scheduled 05/08/2017  Reviewed: Term labor symptoms and general obstetric precautions including but not limited to vaginal bleeding, contractions, leaking of fluid and fetal movement were reviewed in detail with the patient.  All questions were answered.  Follow-up: No Follow-up on file.  Orders Placed This Encounter  Procedures  . POCT urinalysis dipstick   Florian Buff 04/22/2017 4:18 PM

## 2017-04-24 ENCOUNTER — Encounter: Payer: BC Managed Care – PPO | Admitting: Advanced Practice Midwife

## 2017-04-24 ENCOUNTER — Other Ambulatory Visit: Payer: BC Managed Care – PPO

## 2017-04-25 ENCOUNTER — Ambulatory Visit (INDEPENDENT_AMBULATORY_CARE_PROVIDER_SITE_OTHER): Payer: BC Managed Care – PPO | Admitting: Obstetrics & Gynecology

## 2017-04-25 ENCOUNTER — Encounter: Payer: Self-pay | Admitting: Obstetrics & Gynecology

## 2017-04-25 VITALS — BP 112/70 | HR 96 | Wt 234.0 lb

## 2017-04-25 DIAGNOSIS — O0990 Supervision of high risk pregnancy, unspecified, unspecified trimester: Secondary | ICD-10-CM

## 2017-04-25 DIAGNOSIS — O403XX0 Polyhydramnios, third trimester, not applicable or unspecified: Secondary | ICD-10-CM | POA: Diagnosis not present

## 2017-04-25 DIAGNOSIS — O24419 Gestational diabetes mellitus in pregnancy, unspecified control: Secondary | ICD-10-CM | POA: Diagnosis not present

## 2017-04-25 DIAGNOSIS — Z3A37 37 weeks gestation of pregnancy: Secondary | ICD-10-CM | POA: Diagnosis not present

## 2017-04-25 DIAGNOSIS — Z331 Pregnant state, incidental: Secondary | ICD-10-CM

## 2017-04-25 DIAGNOSIS — Z1389 Encounter for screening for other disorder: Secondary | ICD-10-CM

## 2017-04-25 LAB — POCT URINALYSIS DIPSTICK
Blood, UA: NEGATIVE
Glucose, UA: NEGATIVE
Ketones, UA: NEGATIVE
Leukocytes, UA: NEGATIVE
NITRITE UA: NEGATIVE
PROTEIN UA: NEGATIVE

## 2017-04-25 NOTE — Progress Notes (Signed)
   HIGH-RISK PREGNANCY VISIT Patient name: Julia Rangel MRN [id number]  Date of birth: [birth year] Chief Complaint:   High Risk Gestation (NSt)  History of Present Illness:   Julia Rangel is a 31 y.o. G61P2002 female at [redacted]w[redacted]d with an Estimated Date of Delivery: 05/15/17 being seen today for ongoing management of a high-risk pregnancy complicated by Class A2 DM, polyhydramnios.  Today she reports CBG are better. Contractions: Irregular. Vag. Bleeding: None.  Movement: Present. denies leaking of fluid.  Review of Systems:   Pertinent items are noted in HPI Denies abnormal vaginal discharge w/ itching/odor/irritation, headaches, visual changes, shortness of breath, chest pain, abdominal pain, severe nausea/vomiting, or problems with urination or bowel movements unless otherwise stated above. Pertinent History Reviewed:  Reviewed past medical,surgical, social, obstetrical and family history.  Reviewed problem list, medications and allergies. Physical Assessment:   Vitals:   04/25/17 1531  BP: 112/70  Pulse: 96  Weight: 234 lb (106.1 kg)  Body mass index is 37.77 kg/m.           Physical Examination:   General appearance: alert, well appearing, and in no distress  Mental status: alert, oriented to person, place, and time  Skin: warm & dry   Extremities: Edema: Trace    Cardiovascular: normal heart rate noted  Respiratory: normal respiratory effort, no distress  Abdomen: gravid, soft, non-tender  Pelvic: Cervical exam deferred         Fetal Status: Fetal Heart Rate (bpm): 145 Fundal Height: 39 cm Movement: Present    Fetal Surveillance Testing today: Reactive NST   Results for orders placed or performed in visit on 04/25/17 (from the past 24 hour(s))  POCT urinalysis dipstick   Collection Time: 04/25/17  3:32 PM  Result Value Ref Range   Color, UA     Clarity, UA     Glucose, UA neg    Bilirubin, UA     Ketones, UA neg    Spec Grav, UA  1.010 - 1.025   Blood, UA neg    pH, UA   5.0 - 8.0   Protein, UA neg    Urobilinogen, UA  0.2 or 1.0 E.U./dL   Nitrite, UA neg    Leukocytes, UA Negative Negative   Appearance     Odor      Assessment & Plan:  1) High-risk pregnancy G3P2002 at [redacted]w[redacted]d with an Estimated Date of Delivery: 05/15/17   2) Class A2 DM, stable, fastings are better on 1.25 mg  3) Polyhydramnios, stable  Labs/procedures today: Reactive NST  Treatment Plan:  Twice weekly NST, in general, but will do NST on Thursday next week due to the holidays  Reviewed: Term labor symptoms and general obstetric precautions including but not limited to vaginal bleeding, contractions, leaking of fluid and fetal movement were reviewed in detail with the patient.  All questions were answered.  Follow-up: Return in about 7 days (around 05/02/2017) for NST, HROB.  Orders Placed This Encounter  Procedures  . POCT urinalysis dipstick   Florian Buff  04/25/2017 4:20 PM

## 2017-05-02 ENCOUNTER — Ambulatory Visit (INDEPENDENT_AMBULATORY_CARE_PROVIDER_SITE_OTHER): Payer: BC Managed Care – PPO | Admitting: Women's Health

## 2017-05-02 ENCOUNTER — Encounter: Payer: Self-pay | Admitting: Women's Health

## 2017-05-02 VITALS — BP 120/80 | HR 97 | Wt 232.0 lb

## 2017-05-02 DIAGNOSIS — O34219 Maternal care for unspecified type scar from previous cesarean delivery: Secondary | ICD-10-CM | POA: Diagnosis not present

## 2017-05-02 DIAGNOSIS — O24419 Gestational diabetes mellitus in pregnancy, unspecified control: Secondary | ICD-10-CM

## 2017-05-02 DIAGNOSIS — Z3A38 38 weeks gestation of pregnancy: Secondary | ICD-10-CM | POA: Diagnosis not present

## 2017-05-02 DIAGNOSIS — Z331 Pregnant state, incidental: Secondary | ICD-10-CM

## 2017-05-02 DIAGNOSIS — Z1389 Encounter for screening for other disorder: Secondary | ICD-10-CM

## 2017-05-02 DIAGNOSIS — O0993 Supervision of high risk pregnancy, unspecified, third trimester: Secondary | ICD-10-CM

## 2017-05-02 LAB — POCT URINALYSIS DIPSTICK
Blood, UA: NEGATIVE
Glucose, UA: NEGATIVE
Ketones, UA: NEGATIVE
LEUKOCYTES UA: NEGATIVE
NITRITE UA: NEGATIVE
PROTEIN UA: NEGATIVE

## 2017-05-02 NOTE — Progress Notes (Signed)
   HIGH-RISK PREGNANCY VISIT Patient name: Julia Rangel MRN [id number]  Date of birth: 1985-09-12 Chief Complaint:   Routine Prenatal Visit (NST/ room # 12)  History of Present Illness:   Francella Barnett is a 31 y.o. G38P2002 female at [redacted]w[redacted]d with an Estimated Date of Delivery: 05/15/17 being seen today for ongoing management of a high-risk pregnancy complicated by V6XI, currently on glyburide 1.25mg  AM.  Today she reports all CBGs wnl!. Contractions: Irregular.  .  Movement: Present. denies leaking of fluid.  Review of Systems:   Pertinent items are noted in HPI Denies abnormal vaginal discharge w/ itching/odor/irritation, headaches, visual changes, shortness of breath, chest pain, abdominal pain, severe nausea/vomiting, or problems with urination or bowel movements unless otherwise stated above. Pertinent History Reviewed:  Reviewed past medical,surgical, social, obstetrical and family history.  Reviewed problem list, medications and allergies. Physical Assessment:   Vitals:   05/02/17 1122  BP: 120/80  Pulse: 97  Weight: 232 lb (105.2 kg)  Body mass index is 37.45 kg/m.           Physical Examination:   General appearance: alert, well appearing, and in no distress  Mental status: alert, oriented to person, place, and time  Skin: warm & dry   Extremities: Edema: None    Cardiovascular: normal heart rate noted  Respiratory: normal respiratory effort, no distress  Abdomen: gravid, soft, non-tender  Pelvic: Cervical exam performed  Dilation: 1.5 Effacement (%): Thick Station: -2  Fetal Status:     Movement: Present Presentation: Vertex  Fetal Surveillance Testing today: NST: FHR baseline 150 bpm, Variability: moderate, Accelerations:present, Decelerations:  Absent= Cat 1/Reactive     Results for orders placed or performed in visit on 05/02/17 (from the past 24 hour(s))  POCT urinalysis dipstick   Collection Time: 05/02/17 11:35 AM  Result Value Ref Range   Color, UA     Clarity, UA      Glucose, UA neg    Bilirubin, UA     Ketones, UA neg    Spec Grav, UA  1.010 - 1.025   Blood, UA neg    pH, UA  5.0 - 8.0   Protein, UA neg    Urobilinogen, UA  0.2 or 1.0 E.U./dL   Nitrite, UA neg    Leukocytes, UA Negative Negative   Appearance     Odor      Assessment & Plan:  1) High-risk pregnancy G3P2002 at [redacted]w[redacted]d with an Estimated Date of Delivery: 05/15/17   2) A2DM, stable, continue glyburide 1.25mg  AM  3) Prev c/s x 2, for RCS w/ BTL 1/2  Labs/procedures today: nst, sve  Treatment Plan:  NST Monday then RCS Wed  Reviewed: Term labor symptoms and general obstetric precautions including but not limited to vaginal bleeding, contractions, leaking of fluid and fetal movement were reviewed in detail with the patient.  All questions were answered.  Follow-up: Return in about 4 days (around 05/06/2017) for HROB, NST.  Orders Placed This Encounter  Procedures  . POCT urinalysis dipstick   Tawnya Crook CNM, [facility] 05/02/2017 12:57 PM

## 2017-05-02 NOTE — Patient Instructions (Addendum)
Julia Rangel, I greatly value your feedback.  If you receive a survey following your visit with [place] today, we appreciate you taking the time to fill it out.  Thanks, Knute Neu, CNM, WHNP-BC  You have a viral infection that will resolve on its own over time.  Symptoms typically last 3-7 days but can stretch out to 2-3 weeks.  Unfortunately, antibiotics are not helpful for viral infections.  Humidifier and saline nasal spray for nasal congestion  Regular robitussin, cough drops for cough  Warm salt water gargles for sore throat  Mucinex with lots of water to help you cough up the mucous in your chest if needed  Drink plenty of fluids and stay hydrated!  Wash your hands frequently.  Call if you are not improving by 7-10 days.      Call the office [phone]) or go to [facility] if:  You begin to have strong, frequent contractions  Your water breaks.  Sometimes it is a big gush of fluid, sometimes it is just a trickle that keeps getting your panties wet or running down your legs  You have vaginal bleeding.  It is normal to have a small amount of spotting if your cervix was checked.   You don't feel your baby moving like normal.  If you don't, get you something to eat and drink and lay down and focus on feeling your baby move.  You should feel at least 10 movements in 2 hours.  If you don't, you should call the office or go to [place] Contractions Contractions of the uterus can occur throughout pregnancy, but they are not always a sign that you are in labor. You may have practice contractions called Braxton Hicks contractions. These false labor contractions are sometimes confused with true labor. What are [place] contractions? Braxton Hicks contractions are tightening movements that occur in the muscles of the uterus before labor. Unlike true labor contractions, these contractions do not result in opening (dilation) and thinning of the cervix.  Toward the end of pregnancy (32-34 weeks), Braxton Hicks contractions can happen more often and may become stronger. These contractions are sometimes difficult to tell apart from true labor because they can be very uncomfortable. You should not feel embarrassed if you go to the hospital with false labor. Sometimes, the only way to tell if you are in true labor is for your health care provider to look for changes in the cervix. The health care provider will do a physical exam and may monitor your contractions. If you are not in true labor, the exam should show that your cervix is not dilating and your water has not broken. If there are other health problems associated with your pregnancy, it is completely safe for you to be sent home with false labor. You may continue to have Braxton Hicks contractions until you go into true labor. How to tell the difference between true labor and false labor True labor  Contractions last 30-70 seconds.  Contractions become very regular.  Discomfort is usually felt in the top of the uterus, and it spreads to the lower abdomen and low back.  Contractions do not go away with walking.  Contractions usually become more intense and increase in frequency.  The cervix dilates and gets thinner. False labor  Contractions are usually shorter and not as strong as true labor contractions.  Contractions are usually irregular.  Contractions are often felt in the front of the lower abdomen  and in the groin.  Contractions may go away when you walk around or change positions while lying down.  Contractions get weaker and are shorter-lasting as time goes on.  The cervix usually does not dilate or become thin. Follow these instructions at home:  Take over-the-counter and prescription medicines only as told by your health care provider.  Keep up with your usual exercises and follow other instructions from your health care provider.  Eat and drink lightly if you think  you are going into labor.  If Braxton Hicks contractions are making you uncomfortable: ? Change your position from lying down or resting to walking, or change from walking to resting. ? Sit and rest in a tub of warm water. ? Drink enough fluid to keep your urine pale yellow. Dehydration may cause these contractions. ? Do slow and deep breathing several times an hour.  Keep all follow-up prenatal visits as told by your health care provider. This is important. Contact a health care provider if:  You have a fever.  You have continuous pain in your abdomen. Get help right away if:  Your contractions become stronger, more regular, and closer together.  You have fluid leaking or gushing from your vagina.  You pass blood-tinged mucus (bloody show).  You have bleeding from your vagina.  You have low back pain that you never had before.  You feel your baby's head pushing down and causing pelvic pressure.  Your baby is not moving inside you as much as it used to. Summary  Contractions that occur before labor are called Braxton Hicks contractions, false labor, or practice contractions.  Braxton Hicks contractions are usually shorter, weaker, farther apart, and less regular than true labor contractions. True labor contractions usually become progressively stronger and regular and they become more frequent.  Manage discomfort from [facility] contractions by changing position, resting in a warm bath, drinking plenty of water, or practicing deep breathing. This information is not intended to replace advice given to you by your health care provider. Make sure you discuss any questions you have with your health care provider. Document Released: 09/06/2016 Document Revised: 09/06/2016 Document Reviewed: 09/06/2016 Elsevier Interactive Patient Education  2018 [employer].

## 2017-05-03 ENCOUNTER — Encounter (HOSPITAL_COMMUNITY)
Admission: RE | Admit: 2017-05-03 | Discharge: 2017-05-03 | Disposition: A | Discharge status: Home / Self Care | Payer: BC Managed Care – PPO | Source: Ambulatory Visit | Attending: Obstetrics and Gynecology | Admitting: Obstetrics and Gynecology

## 2017-05-03 HISTORY — DX: Gestational diabetes mellitus in pregnancy, unspecified control: O24.419

## 2017-05-03 NOTE — Patient Instructions (Addendum)
Julia Rangel  05/03/2017   Your procedure is scheduled on:  05/08/2017  Enter through the Main Entrance of [facility] up the phone at the desk and dial [phone]  Call this number if you have problems the morning of surgery:[phone]  Remember:   Do not eat food:After Midnight.  Do not drink clear liquids: After Midnight.  Take these medicines the morning of surgery with A SIP OF WATER: TAKE YOUR PROZAC.  DO NOT TAKE YOUR GLYBURIDE THE MORNING OF SURGERY   Do not wear jewelry, make-up or nail polish.  Do not wear lotions, powders, or perfumes. Do not wear deodorant.  Do not shave 48 hours prior to surgery.  Do not bring valuables to the hospital.  [facility] is not   responsible for any belongings or valuables brought to the hospital.  Contacts, dentures or bridgework may not be worn into surgery.  Leave suitcase in the car. After surgery it may be brought to your room.  For patients admitted to the hospital, checkout time is 11:00 AM the day of              discharge.    N/A   Please read over the following fact sheets that you were given:   Surgical Site Infection Prevention    COME TO MATERNITY ADMISSION ON 06/06/2016 BETWEEN 9AM AND 12 NOON.  TELL THEM YOU ARE HERE FOR OUTPATIENT BLOODWORK.  THEY WILL CHECK YOU IN, THEN SHOW YOU TO THE WAITING AREA FOR BLOODWORK.

## 2017-05-06 ENCOUNTER — Encounter: Payer: Self-pay | Admitting: Women's Health

## 2017-05-06 ENCOUNTER — Ambulatory Visit (INDEPENDENT_AMBULATORY_CARE_PROVIDER_SITE_OTHER): Payer: BC Managed Care – PPO | Admitting: Women's Health

## 2017-05-06 ENCOUNTER — Other Ambulatory Visit: Payer: Self-pay | Admitting: Family Medicine

## 2017-05-06 VITALS — BP 108/62 | HR 97 | Wt 234.0 lb

## 2017-05-06 DIAGNOSIS — O24415 Gestational diabetes mellitus in pregnancy, controlled by oral hypoglycemic drugs: Secondary | ICD-10-CM

## 2017-05-06 DIAGNOSIS — Z3A38 38 weeks gestation of pregnancy: Secondary | ICD-10-CM | POA: Diagnosis not present

## 2017-05-06 DIAGNOSIS — D649 Anemia, unspecified: Secondary | ICD-10-CM

## 2017-05-06 DIAGNOSIS — O0993 Supervision of high risk pregnancy, unspecified, third trimester: Secondary | ICD-10-CM

## 2017-05-06 DIAGNOSIS — Z331 Pregnant state, incidental: Secondary | ICD-10-CM

## 2017-05-06 DIAGNOSIS — Z1389 Encounter for screening for other disorder: Secondary | ICD-10-CM

## 2017-05-06 DIAGNOSIS — O0990 Supervision of high risk pregnancy, unspecified, unspecified trimester: Secondary | ICD-10-CM

## 2017-05-06 DIAGNOSIS — O24419 Gestational diabetes mellitus in pregnancy, unspecified control: Secondary | ICD-10-CM

## 2017-05-06 DIAGNOSIS — O99013 Anemia complicating pregnancy, third trimester: Secondary | ICD-10-CM | POA: Diagnosis not present

## 2017-05-06 DIAGNOSIS — O34219 Maternal care for unspecified type scar from previous cesarean delivery: Secondary | ICD-10-CM

## 2017-05-06 LAB — POCT URINALYSIS DIPSTICK
Glucose, UA: NEGATIVE
KETONES UA: NEGATIVE
Leukocytes, UA: NEGATIVE
Nitrite, UA: NEGATIVE
RBC UA: NEGATIVE

## 2017-05-06 LAB — CBC
HCT: 30.1 % — ABNORMAL LOW (ref 36.0–46.0)
HEMOGLOBIN: 9.5 g/dL — AB (ref 12.0–15.0)
MCH: 27.2 pg (ref 26.0–34.0)
MCHC: 31.6 g/dL (ref 30.0–36.0)
MCV: 86.2 fL (ref 78.0–100.0)
Platelets: 195 10*3/uL (ref 150–400)
RBC: 3.49 MIL/uL — AB (ref 3.87–5.11)
RDW: 14.4 % (ref 11.5–15.5)
WBC: 6.4 10*3/uL (ref 4.0–10.5)

## 2017-05-06 LAB — TYPE AND SCREEN
ABO/RH(D): A POS
Antibody Screen: NEGATIVE

## 2017-05-06 MED ORDER — FERROUS SULFATE 325 (65 FE) MG PO TABS: 325.0000 mg | ORAL_TABLET | Freq: Two times a day (BID) | ORAL | 3 refills | Status: DC

## 2017-05-06 NOTE — Patient Instructions (Signed)
Gearldine Bienenstock, I greatly value your feedback.  If you receive a survey following your visit with [place] today, we appreciate you taking the time to fill it out.  Thanks, Knute Neu, CNM, WHNP-BC   Call the office [phone]) or go to [facility] if:  You begin to have strong, frequent contractions  Your water breaks.  Sometimes it is a big gush of fluid, sometimes it is just a trickle that keeps getting your panties wet or running down your legs  You have vaginal bleeding.  It is normal to have a small amount of spotting if your cervix was checked.   You don't feel your baby moving like normal.  If you don't, get you something to eat and drink and lay down and focus on feeling your baby move.  You should feel at least 10 movements in 2 hours.  If you don't, you should call the office or go to [place] Contractions Contractions of the uterus can occur throughout pregnancy, but they are not always a sign that you are in labor. You may have practice contractions called Braxton Hicks contractions. These false labor contractions are sometimes confused with true labor. What are [place] contractions? Braxton Hicks contractions are tightening movements that occur in the muscles of the uterus before labor. Unlike true labor contractions, these contractions do not result in opening (dilation) and thinning of the cervix. Toward the end of pregnancy (32-34 weeks), Braxton Hicks contractions can happen more often and may become stronger. These contractions are sometimes difficult to tell apart from true labor because they can be very uncomfortable. You should not feel embarrassed if you go to the hospital with false labor. Sometimes, the only way to tell if you are in true labor is for your health care provider to look for changes in the cervix. The health care provider will do a physical exam and may monitor your contractions. If you are not in true labor, the exam should show  that your cervix is not dilating and your water has not broken. If there are other health problems associated with your pregnancy, it is completely safe for you to be sent home with false labor. You may continue to have Braxton Hicks contractions until you go into true labor. How to tell the difference between true labor and false labor True labor  Contractions last 30-70 seconds.  Contractions become very regular.  Discomfort is usually felt in the top of the uterus, and it spreads to the lower abdomen and low back.  Contractions do not go away with walking.  Contractions usually become more intense and increase in frequency.  The cervix dilates and gets thinner. False labor  Contractions are usually shorter and not as strong as true labor contractions.  Contractions are usually irregular.  Contractions are often felt in the front of the lower abdomen and in the groin.  Contractions may go away when you walk around or change positions while lying down.  Contractions get weaker and are shorter-lasting as time goes on.  The cervix usually does not dilate or become thin. Follow these instructions at home:  Take over-the-counter and prescription medicines only as told by your health care provider.  Keep up with your usual exercises and follow other instructions from your health care provider.  Eat and drink lightly if you think you are going into labor.  If Braxton Hicks contractions are making you uncomfortable: ? Change your position from lying down  or resting to walking, or change from walking to resting. ? Sit and rest in a tub of warm water. ? Drink enough fluid to keep your urine pale yellow. Dehydration may cause these contractions. ? Do slow and deep breathing several times an hour.  Keep all follow-up prenatal visits as told by your health care provider. This is important. Contact a health care provider if:  You have a fever.  You have continuous pain in your  abdomen. Get help right away if:  Your contractions become stronger, more regular, and closer together.  You have fluid leaking or gushing from your vagina.  You pass blood-tinged mucus (bloody show).  You have bleeding from your vagina.  You have low back pain that you never had before.  You feel your baby's head pushing down and causing pelvic pressure.  Your baby is not moving inside you as much as it used to. Summary  Contractions that occur before labor are called Braxton Hicks contractions, false labor, or practice contractions.  Braxton Hicks contractions are usually shorter, weaker, farther apart, and less regular than true labor contractions. True labor contractions usually become progressively stronger and regular and they become more frequent.  Manage discomfort from [facility] contractions by changing position, resting in a warm bath, drinking plenty of water, or practicing deep breathing. This information is not intended to replace advice given to you by your health care provider. Make sure you discuss any questions you have with your health care provider. Document Released: 09/06/2016 Document Revised: 09/06/2016 Document Reviewed: 09/06/2016 Elsevier Interactive Patient Education  2018 [employer].

## 2017-05-06 NOTE — Progress Notes (Signed)
HIGH-RISK PREGNANCY VISIT Patient name: Julia Rangel MRN [id number]  Date of birth: 1986/03/02 Chief Complaint:   High Risk Gestation (NST)  History of Present Illness:   Julia Rangel is a 31 y.o. G67P2002 female at [redacted]w[redacted]d with an Estimated Date of Delivery: 05/15/17 being seen today for ongoing management of a high-risk pregnancy complicated by W7PX currently on glyburide 1.25mg  AM.  Today she reports uc's all day. Sugars all wnl. Was in 81s earlier today, back up now, 1st time it's done that in awhile. Had pre-op labs today. Hgb 9.5.  Contractions: Irregular. Vag. Bleeding: None.  Movement: Present. denies leaking of fluid.  Review of Systems:   Pertinent items are noted in HPI Denies abnormal vaginal discharge w/ itching/odor/irritation, headaches, visual changes, shortness of breath, chest pain, abdominal pain, severe nausea/vomiting, or problems with urination or bowel movements unless otherwise stated above. Pertinent History Reviewed:  Reviewed past medical,surgical, social, obstetrical and family history.  Reviewed problem list, medications and allergies. Physical Assessment:   Vitals:   05/06/17 1443  BP: 108/62  Pulse: 97  Weight: 234 lb (106.1 kg)  Body mass index is 37.77 kg/m.           Physical Examination:   General appearance: alert, well appearing, and in no distress  Mental status: alert, oriented to person, place, and time  Skin: warm & dry   Extremities: Edema: Trace    Cardiovascular: normal heart rate noted  Respiratory: normal respiratory effort, no distress  Abdomen: gravid, soft, non-tender  Pelvic: Cervical exam performed  Dilation: 1.5 Effacement (%): 50 Station: -2  Fetal Status: Fetal Heart Rate (bpm): 140 Fundal Height: 39 cm Movement: Present Presentation: Vertex  Fetal Surveillance Testing today: NST: FHR baseline 140 bpm, Variability: moderate, Accelerations:present, Decelerations:  Absent= Cat 1/Reactive UCs: irregular at beginning, q 71mins at  end     Results for orders placed or performed during the hospital encounter of 05/08/17 (from the past 24 hour(s))  Type and screen   Collection Time: 05/06/17 10:00 AM  Result Value Ref Range   ABO/RH(D) A POS    Antibody Screen NEG    Sample Expiration 05/09/2017   CBC   Collection Time: 05/06/17 10:05 AM  Result Value Ref Range   WBC 6.4 4.0 - 10.5 K/uL   RBC 3.49 (L) 3.87 - 5.11 MIL/uL   Hemoglobin 9.5 (L) 12.0 - 15.0 g/dL   HCT 30.1 (L) 36.0 - 46.0 %   MCV 86.2 78.0 - 100.0 fL   MCH 27.2 26.0 - 34.0 pg   MCHC 31.6 30.0 - 36.0 g/dL   RDW 14.4 11.5 - 15.5 %   Platelets 195 150 - 400 K/uL  Results for orders placed or performed in visit on 05/06/17 (from the past 24 hour(s))  POCT urinalysis dipstick   Collection Time: 05/06/17  2:45 PM  Result Value Ref Range   Color, UA     Clarity, UA     Glucose, UA neg    Bilirubin, UA     Ketones, UA neg    Spec Grav, UA  1.010 - 1.025   Blood, UA neg    pH, UA  5.0 - 8.0   Protein, UA trace    Urobilinogen, UA  0.2 or 1.0 E.U./dL   Nitrite, UA neg    Leukocytes, UA Negative Negative   Appearance     Odor      Assessment & Plan:  1) High-risk pregnancy G3P2002 at [redacted]w[redacted]d with an Estimated Date  of Delivery: 05/15/17   2) A2DM, stable continue glyburide 1.25mg  AM  3) Prev c/s x 2, for RCS w/ BTL 1/2 w/ JVF  4) Anemia> rx fe BID  Labs/procedures today: nst, sve  Treatment Plan:  RCS Wed 1/2 as scheduled  Reviewed: Term labor symptoms and general obstetric precautions including but not limited to vaginal bleeding, contractions, leaking of fluid and fetal movement were reviewed in detail with the patient.  All questions were answered.  Follow-up: Return in about 9 days (around 05/15/2017) for F/U incision check.  Orders Placed This Encounter  Procedures  . POCT urinalysis dipstick   Tawnya Crook CNM, [facility] 05/06/2017 3:26 PM

## 2017-05-07 LAB — RPR: RPR Ser Ql: NONREACTIVE

## 2017-05-08 ENCOUNTER — Encounter (HOSPITAL_COMMUNITY)
Admission: AD | Disposition: A | Discharge status: Home / Self Care | Payer: Self-pay | Source: Ambulatory Visit | Attending: Obstetrics and Gynecology

## 2017-05-08 ENCOUNTER — Inpatient Hospital Stay (HOSPITAL_COMMUNITY): Payer: BC Managed Care – PPO | Admitting: Anesthesiology

## 2017-05-08 ENCOUNTER — Inpatient Hospital Stay (HOSPITAL_COMMUNITY)
Admission: AD | Admit: 2017-05-08 | Discharge: 2017-05-10 | DRG: 785 | Disposition: A | Discharge status: Home / Self Care | Payer: BC Managed Care – PPO | Source: Ambulatory Visit | Attending: Obstetrics and Gynecology | Admitting: Obstetrics and Gynecology

## 2017-05-08 ENCOUNTER — Encounter (HOSPITAL_COMMUNITY): Payer: Self-pay | Admitting: *Deleted

## 2017-05-08 DIAGNOSIS — Z98891 History of uterine scar from previous surgery: Secondary | ICD-10-CM

## 2017-05-08 DIAGNOSIS — O24425 Gestational diabetes mellitus in childbirth, controlled by oral hypoglycemic drugs: Secondary | ICD-10-CM | POA: Diagnosis present

## 2017-05-08 DIAGNOSIS — Z3A39 39 weeks gestation of pregnancy: Secondary | ICD-10-CM

## 2017-05-08 DIAGNOSIS — O34211 Maternal care for low transverse scar from previous cesarean delivery: Principal | ICD-10-CM | POA: Diagnosis present

## 2017-05-08 DIAGNOSIS — Z302 Encounter for sterilization: Secondary | ICD-10-CM | POA: Diagnosis not present

## 2017-05-08 DIAGNOSIS — O24419 Gestational diabetes mellitus in pregnancy, unspecified control: Secondary | ICD-10-CM

## 2017-05-08 DIAGNOSIS — O3663X0 Maternal care for excessive fetal growth, third trimester, not applicable or unspecified: Secondary | ICD-10-CM | POA: Diagnosis present

## 2017-05-08 DIAGNOSIS — O403XX0 Polyhydramnios, third trimester, not applicable or unspecified: Secondary | ICD-10-CM | POA: Diagnosis present

## 2017-05-08 DIAGNOSIS — O9902 Anemia complicating childbirth: Secondary | ICD-10-CM | POA: Diagnosis present

## 2017-05-08 DIAGNOSIS — D649 Anemia, unspecified: Secondary | ICD-10-CM | POA: Diagnosis present

## 2017-05-08 DIAGNOSIS — O34219 Maternal care for unspecified type scar from previous cesarean delivery: Secondary | ICD-10-CM

## 2017-05-08 LAB — CBC
HEMATOCRIT: 28.1 % — AB (ref 36.0–46.0)
Hemoglobin: 9.3 g/dL — ABNORMAL LOW (ref 12.0–15.0)
MCH: 27.9 pg (ref 26.0–34.0)
MCHC: 33.1 g/dL (ref 30.0–36.0)
MCV: 84.4 fL (ref 78.0–100.0)
Platelets: 170 10*3/uL (ref 150–400)
RBC: 3.33 MIL/uL — ABNORMAL LOW (ref 3.87–5.11)
RDW: 14.5 % (ref 11.5–15.5)
WBC: 9.2 10*3/uL (ref 4.0–10.5)

## 2017-05-08 LAB — CREATININE, SERUM
Creatinine, Ser: 0.47 mg/dL (ref 0.44–1.00)
GFR calc non Af Amer: >60 mL/min (ref >60)

## 2017-05-08 LAB — GLUCOSE, CAPILLARY
GLUCOSE-CAPILLARY: 70 mg/dL (ref 65–99)
Glucose-Capillary: 77 mg/dL (ref 65–99)

## 2017-05-08 SURGERY — Surgical Case
Anesthesia: Spinal | Site: Abdomen | Laterality: Bilateral | Wound class: Clean Contaminated

## 2017-05-08 MED ORDER — ONDANSETRON HCL 4 MG/2ML IJ SOLN: 4.0000 mg | Freq: Three times a day (TID) | INTRAMUSCULAR | Status: DC | PRN

## 2017-05-08 MED ORDER — SODIUM CHLORIDE 0.9% FLUSH: 3.0000 mL | INTRAVENOUS | Status: DC | PRN

## 2017-05-08 MED ORDER — SIMETHICONE 80 MG PO CHEW: 80.0000 mg | CHEWABLE_TABLET | ORAL | Status: DC | PRN

## 2017-05-08 MED ORDER — TETANUS-DIPHTH-ACELL PERTUSSIS 5-2.5-18.5 LF-MCG/0.5 IM SUSP: 0.5000 mL | Freq: Once | INTRAMUSCULAR | Status: DC

## 2017-05-08 MED ORDER — DIBUCAINE 1 % RE OINT: 1.0000 "application " | TOPICAL_OINTMENT | RECTAL | Status: DC | PRN

## 2017-05-08 MED ORDER — DIPHENHYDRAMINE HCL 25 MG PO CAPS
25.0000 mg | ORAL_CAPSULE | ORAL | Status: DC | PRN
  Administered 2017-05-08: 25 mg via ORAL
  Filled 2017-05-08: qty 1

## 2017-05-08 MED ORDER — ACETAMINOPHEN 325 MG PO TABS
650.0000 mg | ORAL_TABLET | ORAL | Status: DC | PRN
  Administered 2017-05-08: 650 mg via ORAL
  Filled 2017-05-08: qty 2

## 2017-05-08 MED ORDER — SIMETHICONE 80 MG PO CHEW
80.0000 mg | CHEWABLE_TABLET | Freq: Three times a day (TID) | ORAL | Status: DC
  Administered 2017-05-08 – 2017-05-09 (×4): 80 mg via ORAL
  Filled 2017-05-08 (×4): qty 1

## 2017-05-08 MED ORDER — SIMETHICONE 80 MG PO CHEW
80.0000 mg | CHEWABLE_TABLET | ORAL | Status: DC
  Administered 2017-05-09 – 2017-05-10 (×2): 80 mg via ORAL
  Filled 2017-05-08 (×2): qty 1

## 2017-05-08 MED ORDER — ZOLPIDEM TARTRATE 5 MG PO TABS: 5.0000 mg | ORAL_TABLET | Freq: Every evening | ORAL | Status: DC | PRN

## 2017-05-08 MED ORDER — LACTATED RINGERS IV SOLN
INTRAVENOUS | Status: DC | PRN
  Administered 2017-05-08: 08:00:00 via INTRAVENOUS

## 2017-05-08 MED ORDER — SODIUM CHLORIDE 0.9 % IR SOLN
Status: DC | PRN
  Administered 2017-05-08: 1000 mL

## 2017-05-08 MED ORDER — SCOPOLAMINE 1 MG/3DAYS TD PT72: 1.0000 | MEDICATED_PATCH | Freq: Once | TRANSDERMAL | Status: DC

## 2017-05-08 MED ORDER — BUPIVACAINE IN DEXTROSE 0.75-8.25 % IT SOLN
INTRATHECAL | Status: DC | PRN
  Administered 2017-05-08: 1.6 mL via INTRATHECAL

## 2017-05-08 MED ORDER — OXYTOCIN 10 UNIT/ML IJ SOLN
INTRAMUSCULAR | Status: AC
  Filled 2017-05-08: qty 4

## 2017-05-08 MED ORDER — KETOROLAC TROMETHAMINE 30 MG/ML IJ SOLN
30.0000 mg | Freq: Once | INTRAMUSCULAR | Status: DC | PRN
  Administered 2017-05-08: 30 mg via INTRAVENOUS

## 2017-05-08 MED ORDER — OXYTOCIN 10 UNIT/ML IJ SOLN
INTRAVENOUS | Status: DC | PRN
  Administered 2017-05-08: 40 [IU] via INTRAVENOUS

## 2017-05-08 MED ORDER — MORPHINE SULFATE (PF) 0.5 MG/ML IJ SOLN
INTRAMUSCULAR | Status: AC
  Filled 2017-05-08: qty 10

## 2017-05-08 MED ORDER — PHENYLEPHRINE 8 MG IN D5W 100 ML (0.08MG/ML) PREMIX OPTIME
INJECTION | INTRAVENOUS | Status: AC
  Filled 2017-05-08: qty 100

## 2017-05-08 MED ORDER — COCONUT OIL OIL
1.0000 "application " | TOPICAL_OIL | Status: DC | PRN
  Administered 2017-05-09: 1 via TOPICAL
  Filled 2017-05-08: qty 120

## 2017-05-08 MED ORDER — OXYTOCIN 40 UNITS IN LACTATED RINGERS INFUSION - SIMPLE MED: 2.5000 [IU]/h | INTRAVENOUS | Status: AC

## 2017-05-08 MED ORDER — DIPHENHYDRAMINE HCL 25 MG PO CAPS: 25.0000 mg | ORAL_CAPSULE | Freq: Four times a day (QID) | ORAL | Status: DC | PRN

## 2017-05-08 MED ORDER — ENOXAPARIN SODIUM 40 MG/0.4ML ~~LOC~~ SOLN
40.0000 mg | SUBCUTANEOUS | Status: DC
  Administered 2017-05-09: 40 mg via SUBCUTANEOUS
  Filled 2017-05-08 (×2): qty 0.4

## 2017-05-08 MED ORDER — NALBUPHINE HCL 10 MG/ML IJ SOLN: 5.0000 mg | INTRAMUSCULAR | Status: DC | PRN

## 2017-05-08 MED ORDER — LACTATED RINGERS IV SOLN: INTRAVENOUS | Status: DC

## 2017-05-08 MED ORDER — IBUPROFEN 600 MG PO TABS
600.0000 mg | ORAL_TABLET | Freq: Four times a day (QID) | ORAL | Status: DC
  Administered 2017-05-08 – 2017-05-10 (×7): 600 mg via ORAL
  Filled 2017-05-08 (×7): qty 1

## 2017-05-08 MED ORDER — HYDROMORPHONE HCL 1 MG/ML IJ SOLN
0.2500 mg | INTRAMUSCULAR | Status: DC | PRN
  Administered 2017-05-08: 0.5 mg via INTRAVENOUS
  Administered 2017-05-08 (×2): 0.25 mg via INTRAVENOUS

## 2017-05-08 MED ORDER — PHENYLEPHRINE 8 MG IN D5W 100 ML (0.08MG/ML) PREMIX OPTIME
INJECTION | INTRAVENOUS | Status: DC | PRN
  Administered 2017-05-08: 60 ug/min via INTRAVENOUS

## 2017-05-08 MED ORDER — FENTANYL CITRATE (PF) 100 MCG/2ML IJ SOLN
INTRAMUSCULAR | Status: DC | PRN
  Administered 2017-05-08: 10 ug via INTRATHECAL
  Administered 2017-05-08: 100 ug via INTRAVENOUS

## 2017-05-08 MED ORDER — FENTANYL CITRATE (PF) 100 MCG/2ML IJ SOLN
INTRAMUSCULAR | Status: AC
  Filled 2017-05-08: qty 2

## 2017-05-08 MED ORDER — PROMETHAZINE HCL 25 MG/ML IJ SOLN: 6.2500 mg | INTRAMUSCULAR | Status: DC | PRN

## 2017-05-08 MED ORDER — SENNOSIDES-DOCUSATE SODIUM 8.6-50 MG PO TABS
2.0000 | ORAL_TABLET | ORAL | Status: DC
  Administered 2017-05-09: 2 via ORAL
  Filled 2017-05-08 (×2): qty 2

## 2017-05-08 MED ORDER — STERILE WATER FOR IRRIGATION IR SOLN
Status: DC | PRN
  Administered 2017-05-08: 1000 mL

## 2017-05-08 MED ORDER — OXYCODONE HCL 5 MG PO TABS: 5.0000 mg | ORAL_TABLET | Freq: Once | ORAL | Status: DC | PRN

## 2017-05-08 MED ORDER — ONDANSETRON HCL 4 MG/2ML IJ SOLN
INTRAMUSCULAR | Status: AC
  Filled 2017-05-08: qty 2

## 2017-05-08 MED ORDER — DIPHENHYDRAMINE HCL 50 MG/ML IJ SOLN: 12.5000 mg | INTRAMUSCULAR | Status: DC | PRN

## 2017-05-08 MED ORDER — MENTHOL 3 MG MT LOZG: 1.0000 | LOZENGE | OROMUCOSAL | Status: DC | PRN

## 2017-05-08 MED ORDER — NALBUPHINE HCL 10 MG/ML IJ SOLN: 5.0000 mg | Freq: Once | INTRAMUSCULAR | Status: DC | PRN

## 2017-05-08 MED ORDER — LACTATED RINGERS IV SOLN
INTRAVENOUS | Status: DC
  Administered 2017-05-08: 125 mL/h via INTRAVENOUS
  Administered 2017-05-08 (×2): via INTRAVENOUS

## 2017-05-08 MED ORDER — NALOXONE HCL 0.4 MG/ML IJ SOLN: 0.4000 mg | INTRAMUSCULAR | Status: DC | PRN

## 2017-05-08 MED ORDER — KETOROLAC TROMETHAMINE 30 MG/ML IJ SOLN
INTRAMUSCULAR | Status: AC
Start: 2017-05-08 — End: 2017-05-08
  Filled 2017-05-08: qty 1

## 2017-05-08 MED ORDER — FLUOXETINE HCL 20 MG PO CAPS
20.0000 mg | ORAL_CAPSULE | Freq: Every day | ORAL | Status: DC
  Administered 2017-05-08 – 2017-05-09 (×2): 20 mg via ORAL
  Filled 2017-05-08 (×3): qty 1

## 2017-05-08 MED ORDER — WITCH HAZEL-GLYCERIN EX PADS: 1.0000 "application " | MEDICATED_PAD | CUTANEOUS | Status: DC | PRN

## 2017-05-08 MED ORDER — MORPHINE SULFATE (PF) 0.5 MG/ML IJ SOLN
INTRAMUSCULAR | Status: DC | PRN
  Administered 2017-05-08: .2 mg via INTRATHECAL

## 2017-05-08 MED ORDER — CEFAZOLIN SODIUM-DEXTROSE 2-4 GM/100ML-% IV SOLN
2.0000 g | INTRAVENOUS | Status: AC
  Administered 2017-05-08: 2 g via INTRAVENOUS

## 2017-05-08 MED ORDER — HYDROMORPHONE HCL 1 MG/ML IJ SOLN
INTRAMUSCULAR | Status: AC
  Filled 2017-05-08: qty 1

## 2017-05-08 MED ORDER — PRENATAL MULTIVITAMIN CH
1.0000 | ORAL_TABLET | Freq: Every day | ORAL | Status: DC
  Administered 2017-05-09: 1 via ORAL
  Filled 2017-05-08: qty 1

## 2017-05-08 MED ORDER — ONDANSETRON HCL 4 MG/2ML IJ SOLN
INTRAMUSCULAR | Status: DC | PRN
  Administered 2017-05-08: 4 mg via INTRAVENOUS

## 2017-05-08 MED ORDER — NALOXONE HCL 0.4 MG/ML IJ SOLN: 1.0000 ug/kg/h | INTRAVENOUS | Status: DC | PRN

## 2017-05-08 MED ORDER — OXYCODONE HCL 5 MG/5ML PO SOLN: 5.0000 mg | Freq: Once | ORAL | Status: DC | PRN

## 2017-05-08 SURGICAL SUPPLY — 33 items
APL SKNCLS STERI-STRIP NONHPOA (GAUZE/BANDAGES/DRESSINGS) ×1
BENZOIN TINCTURE PRP APPL 2/3 (GAUZE/BANDAGES/DRESSINGS) ×2 IMPLANT
CLAMP CORD UMBIL (MISCELLANEOUS) ×2 IMPLANT
CLIP FILSHIE TUBAL LIGA STRL (Clip) ×2 IMPLANT
CLOSURE WOUND 1/2 X4 (GAUZE/BANDAGES/DRESSINGS) ×1
CLOTH BEACON ORANGE TIMEOUT ST (SAFETY) ×3 IMPLANT
DRSG OPSITE POSTOP 4X10 (GAUZE/BANDAGES/DRESSINGS) ×3 IMPLANT
DRSG OPSITE POSTOP 4X12 (GAUZE/BANDAGES/DRESSINGS) ×2 IMPLANT
DURAPREP 26ML APPLICATOR (WOUND CARE) ×3 IMPLANT
ELECT REM PT RETURN 9FT ADLT (ELECTROSURGICAL) ×3
ELECTRODE REM PT RTRN 9FT ADLT (ELECTROSURGICAL) ×1 IMPLANT
GLOVE BIO SURGEON ST LM GN SZ9 (GLOVE) ×3 IMPLANT
GLOVE BIOGEL PI IND STRL 7.0 (GLOVE) ×1 IMPLANT
GLOVE BIOGEL PI IND STRL 9 (GLOVE) ×1 IMPLANT
GLOVE BIOGEL PI INDICATOR 7.0 (GLOVE) ×2
GLOVE BIOGEL PI INDICATOR 9 (GLOVE) ×2
GOWN STRL REUS W/TWL 2XL LVL3 (GOWN DISPOSABLE) ×3 IMPLANT
GOWN STRL REUS W/TWL LRG LVL3 (GOWN DISPOSABLE) ×6 IMPLANT
NS IRRIG 1000ML POUR BTL (IV SOLUTION) ×3 IMPLANT
PACK C SECTION WH (CUSTOM PROCEDURE TRAY) ×3 IMPLANT
PAD OB MATERNITY 4.3X12.25 (PERSONAL CARE ITEMS) ×3 IMPLANT
PENCIL SMOKE EVAC W/HOLSTER (ELECTROSURGICAL) ×3 IMPLANT
RTRCTR C-SECT PINK 25CM LRG (MISCELLANEOUS) ×2 IMPLANT
STRIP CLOSURE SKIN 1/2X4 (GAUZE/BANDAGES/DRESSINGS) ×1 IMPLANT
SUT MNCRL 0 VIOLET CTX 36 (SUTURE) ×2 IMPLANT
SUT MONOCRYL 0 CTX 36 (SUTURE) ×4
SUT VIC AB 0 CT1 27 (SUTURE) ×6
SUT VIC AB 0 CT1 27XBRD ANBCTR (SUTURE) ×1 IMPLANT
SUT VIC AB 2-0 CT1 27 (SUTURE) ×9
SUT VIC AB 2-0 CT1 TAPERPNT 27 (SUTURE) ×2 IMPLANT
SUT VIC AB 4-0 KS 27 (SUTURE) ×3 IMPLANT
TOWEL OR 17X24 6PK STRL BLUE (TOWEL DISPOSABLE) ×3 IMPLANT
TRAY FOLEY BAG SILVER LF 14FR (SET/KITS/TRAYS/PACK) ×3 IMPLANT

## 2017-05-08 NOTE — Op Note (Signed)
Please see the brief operative note for surgical details.  EBL 688 cc on this repeat cesarean section and bilateral tubal ligation by Filshie clip application

## 2017-05-08 NOTE — Addendum Note (Signed)
Addendum  created 05/08/17 1421 by Asher Muir, CRNA   Sign clinical note

## 2017-05-08 NOTE — Anesthesia Postprocedure Evaluation (Signed)
Anesthesia Post Note  Patient: Julia Rangel  Procedure(s) Performed: CESAREAN SECTION WITH BILATERAL TUBAL LIGATION (Bilateral Abdomen)     Patient location during evaluation: PACU Anesthesia Type: Spinal Level of consciousness: oriented and awake and alert Pain management: pain level controlled Vital Signs Assessment: post-procedure vital signs reviewed and stable Respiratory status: spontaneous breathing and respiratory function stable Cardiovascular status: blood pressure returned to baseline and stable Postop Assessment: no headache, no backache and no apparent nausea or vomiting Anesthetic complications: no    Last Vitals:  Vitals:   05/08/17 1000 05/08/17 1023  BP: 105/62 116/62  Pulse: 77 76  Resp: 15 18  Temp: 36.7 C (!) 36.4 C  SpO2: 100% 100%    Last Pain:  Vitals:   05/08/17 1030  TempSrc:   PainSc: 3    Pain Goal:                 Lynda Rainwater

## 2017-05-08 NOTE — Anesthesia Procedure Notes (Signed)
Spinal  Patient location during procedure: OB Start time: 05/08/2017 7:39 AM End time: 05/08/2017 7:44 AM Staffing Anesthesiologist: Lynda Rainwater, MD Performed: anesthesiologist  Preanesthetic Checklist Completed: patient identified, surgical consent, pre-op evaluation, timeout performed, IV checked, risks and benefits discussed and monitors and equipment checked Spinal Block Patient position: sitting Prep: site prepped and draped and DuraPrep Patient monitoring: heart rate, cardiac monitor, continuous pulse ox and blood pressure Approach: midline Location: L3-4 Injection technique: single-shot Needle Needle type: Pencan  Needle gauge: 24 G Needle length: 10 cm Assessment Sensory level: T4

## 2017-05-08 NOTE — Transfer of Care (Signed)
Immediate Anesthesia Transfer of Care Note  Patient: Julia Rangel  Procedure(s) Performed: CESAREAN SECTION WITH BILATERAL TUBAL LIGATION (Bilateral Abdomen)  Patient Location: PACU  Anesthesia Type:Spinal  Level of Consciousness: awake, alert  and oriented  Airway & Oxygen Therapy: Patient Spontanous Breathing  Post-op Assessment: Report given to RN and Post -op Vital signs reviewed and stable  Post vital signs: Reviewed and stable  Last Vitals:  Vitals:   05/08/17 0559  BP: 120/67  Pulse: 91  Resp: 18  Temp: 36.7 C  SpO2: 98%    Last Pain:  Vitals:   05/08/17 0559  TempSrc: Oral  PainSc: 0-No pain         Complications: No apparent anesthesia complications

## 2017-05-08 NOTE — Anesthesia Postprocedure Evaluation (Signed)
Anesthesia Post Note  Patient: Julia Rangel  Procedure(s) Performed: CESAREAN SECTION WITH BILATERAL TUBAL LIGATION (Bilateral Abdomen)     Patient location during evaluation: Mother Baby Anesthesia Type: Spinal Level of consciousness: awake Pain management: satisfactory to patient Vital Signs Assessment: post-procedure vital signs reviewed and stable Respiratory status: spontaneous breathing Cardiovascular status: stable Anesthetic complications: no    Last Vitals:  Vitals:   05/08/17 1230 05/08/17 1300  BP: 114/62   Pulse: 85   Resp: 18 18  Temp: 36.9 C 37 C  SpO2: 97% 99%    Last Pain:  Vitals:   05/08/17 1300  TempSrc: Oral  PainSc:    Pain Goal:                 [employer]

## 2017-05-08 NOTE — Anesthesia Preprocedure Evaluation (Signed)
Anesthesia Evaluation  Patient identified by MRN, date of birth, ID band Patient awake    Reviewed: Allergy & Precautions, H&P , NPO status , Patient's Chart, lab work & pertinent test results  History of Anesthesia Complications (+) PONV  Airway Mallampati: II  TM Distance: >3 FB Neck ROM: full    Dental no notable dental hx.    Pulmonary neg pulmonary ROS,    Pulmonary exam normal breath sounds clear to auscultation       Cardiovascular negative cardio ROS Normal cardiovascular exam Rhythm:Regular Rate:Normal     Neuro/Psych  Headaches, Depression    GI/Hepatic negative GI ROS, Neg liver ROS,   Endo/Other  negative endocrine ROSdiabetes, Gestational, Oral Hypoglycemic Agents  Renal/GU      Musculoskeletal   Abdominal Normal abdominal exam  (+)   Peds  Hematology  (+) anemia ,   Anesthesia Other Findings   Reproductive/Obstetrics (+) Pregnancy                             Anesthesia Physical  Anesthesia Plan  ASA: II  Anesthesia Plan: Spinal   Post-op Pain Management:    Induction:   PONV Risk Score and Plan: 4 or greater and Treatment may vary due to age or medical condition and Ondansetron  Airway Management Planned: Natural Airway  Additional Equipment:   Intra-op Plan:   Post-operative Plan:   Informed Consent: I have reviewed the patients History and Physical, chart, labs and discussed the procedure including the risks, benefits and alternatives for the proposed anesthesia with the patient or authorized representative who has indicated his/her understanding and acceptance.   Dental advisory given  Plan Discussed with: CRNA and Surgeon  Anesthesia Plan Comments:         Anesthesia Quick Evaluation

## 2017-05-08 NOTE — Brief Op Note (Signed)
05/08/2017  8:52 AM  PATIENT:  Julia Rangel  32 y.o. female  PRE-OPERATIVE DIAGNOSIS: Pregnancy 39 weeks REPEAT CESAREAN SECTION ;DESIRE STERILIZATION  POST-OPERATIVE DIAGNOSIS: Pregnancy 39 weeks delivered REPEAT CESAREAN SECTION ;DESIRE STERILIZATION  PROCEDURE:  Procedure(s): CESAREAN SECTION WITH BILATERAL TUBAL LIGATION (Bilateral) Repeat low transverse SURGEON:  Surgeon(s) and Role:    * Jonnie Kind, MD - Primary  PHYSICIAN ASSISTANT:   ASSISTANTS: none   ANESTHESIA:   spinal  EBL:  688 mL   BLOOD ADMINISTERED:none  DRAINS: Urinary Catheter (Foley)   LOCAL MEDICATIONS USED:  NONE  SPECIMEN:  Source of Specimen:  Placenta to labor and delivery  DISPOSITION OF SPECIMEN:  N/A  COUNTS:  YES  TOURNIQUET:  * No tourniquets in log *  DICTATION: .Dragon Dictation  PLAN OF CARE: Admit to inpatient   PATIENT DISPOSITION:  PACU - hemodynamically stable.   Delay start of Pharmacological VTE agent (>24hrs) due to surgical blood loss or risk of bleeding: not applicable Details of procedure: Patient was taken the operating room prepped and draped for lower abdominal surgery with Foley catheter in place, Ancef administered, timeout conducted and procedure confirmed by operative team.  And a 22 cm long by 6 cm wide ellipse of skin and underlying fatty tissue.  The fascia was opened transversely 5 mm omental adhesion to the anterior abdominal wall which was taken down.  Alexis retractor was positioned, and bladder flap developed.  Transverse uterine incision performed through the lower uterine segment which was quite thick.  Fetal vertex was delivered through the incision without difficulty and cord clamped at 1 minute.  The infant was passed to the waiting pediatricians.  See their notes for further details regarding the baby.  Cord blood samples were obtained and placenta delivered in response to crede uterine massage.  The membranes came out intact.  The uterus was closed in  a running locking 0 Monocryl first layer and continuous running 0 Monocryl second layer.  2 interrupted sutures of 0 Vicryl were placed at the left corner of the incision due to small bit of oozing from a surface bleeder. Hemostasis was good. Tubal ligation: The fallopian tubes were easily identified on each side.  Ovaries were normal.  The right fallopian tube was identified to its fimbriated end, and a Filshie clip placed at midportion of the tube without difficulty and position confirmed.  The left tube was treated in similar fashion.  Abdomen was irrigated.  The anterior peritoneum was closed with running 2-0 Vicryl, the fascia closed with 0 Vicryl running, the subcutaneous fatty tissues mobilized sufficiently to resulting good reapproximation and subcutaneous interrupted horizontal mattress sutures of 2-0 Vicryl used to reapproximate subcutaneous fatty tissues and then subcuticular 4-0 Vicryl on a Keith needle used to close the skin edges.  Steri-Strips and benzoin were applied patient to recovery room in good condition .end of dictation copy to [facility].

## 2017-05-08 NOTE — H&P (Signed)
Julia Rangel is a 32 y.o. female G3P2002 at [redacted]w[redacted]d presenting for repeat cesarean section and bilateral tubal ligation.. OB History    Gravida Para Term Preterm AB Living   3 2 2     2    SAB TAB Ectopic Multiple Live Births         0 2     Past Medical History:  Diagnosis Date  . Anemia   . Dysplasia of cervix   . Gestational diabetes    glyburide  . Kidney stone   . Menorrhagia   . Migraines    since age 11 per patient  . Patient desires pregnancy 06/01/2013  . 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 PMDD (premenstrual dysphoric disorder)    PROZAC 20 MG BUT NONE WITH PREGNANCY  . PONV (postoperative nausea and vomiting)   . Vaginal Pap smear, abnormal    Past Surgical History:  Procedure Laterality Date  . CESAREAN SECTION  2010  . CESAREAN SECTION N/A 03/29/2014   Procedure: REPEAT CESAREAN SECTION;  Surgeon: Florian Buff, MD;  Location: [facility];  Service: Obstetrics;  Laterality: N/A;  . KIDNEY STONE SURGERY  2010   stent  . LEEP  2008  . phylloid removed  2009   left breast  . TONSILLECTOMY    . WISDOM TOOTH EXTRACTION     Family History: family history includes COPD in her paternal grandfather; Cancer in her maternal grandmother and paternal grandmother; Diabetes in her maternal grandmother and paternal grandmother. Social History:  reports that  has never smoked. she has never used smokeless tobacco. She reports that she does not drink alcohol or use drugs.     Maternal Diabetes: Yes:  Diabetes Type:  Insulin/Medication controlled Genetic Screening: Normal Maternal Ultrasounds/Referrals: Normal LGA infant. Fetal Ultrasounds or other Referrals:  None Maternal Substance Abuse:  No Significant Maternal Medications:  Meds include: Other:  glyburide Significant Maternal Lab Results:  Lab values include: Group B Strep negative Other Comments:  None  ROS  Anemic, on iron only late in pregnancy History   Blood pressure 120/67,  pulse 91, temperature 98.1 F (36.7 C), temperature source Oral, resp. rate 18, height 5\' 6"  (1.676 m), weight 234 lb (106.1 kg), last menstrual period 08/08/2016, SpO2 98 %. Exam Physical Exam  Constitutional: She is oriented to person, place, and time. She appears well-developed and well-nourished.  HENT:  Head: Normocephalic.  Eyes: Pupils are equal, round, and reactive to light.  Neck: Normal range of motion.  Cardiovascular: Normal rate.  Respiratory: Effort normal.  GI: Soft.  Gravid uterus, c/w dates. LGA infant on 36 wk u./s  Neurological: She is alert and oriented to person, place, and time.    Prenatal labs: ABO, Rh: --/--/A POS (12/31 1000) Antibody: NEG (12/31 1000) Rubella: 4.77 (06/05 1611) RPR: Non Reactive (12/31 1005)  HBsAg: Negative (06/05 1611)  HIV: Non Reactive (10/10 0832)  GBS:    CBC    Component Value Date/Time   WBC 6.4 05/06/2017 1005   RBC 3.49 (L) 05/06/2017 1005   HGB 9.5 (L) 05/06/2017 1005   HGB 10.4 (L) 02/13/2017 0832   HCT 30.1 (L) 05/06/2017 1005   HCT 30.3 (L) 02/13/2017 0832   PLT 195 05/06/2017 1005   PLT 201 02/13/2017 0832   MCV 86.2 05/06/2017 1005   MCV 88 02/13/2017 0832   MCH 27.2 05/06/2017 1005   MCHC 31.6 05/06/2017 1005   RDW 14.4 05/06/2017 1005  RDW 12.8 02/13/2017 0832   LYMPHSABS 1.2 01/15/2009 0600   MONOABS 0.5 01/15/2009 0600   EOSABS 0.1 01/15/2009 0600   BASOSABS 0.0 01/15/2009 0600    Assessment/Plan: Pregnancy 39 wk , prior cesarean x 2 , for repeat, desire for permanent sterilization. PLAN: repeat cesarean and bilateral tubal ligation by filshie clips.   Jonnie Kind 05/08/2017, 7:24 AM

## 2017-05-09 LAB — GLUCOSE, CAPILLARY
Glucose-Capillary: 80 mg/dL (ref 65–99)
Glucose-Capillary: 96 mg/dL (ref 65–99)
Glucose-Capillary: 151 mg/dL — ABNORMAL HIGH (ref 65–99)
Glucose-Capillary: 97 mg/dL (ref 65–99)

## 2017-05-09 LAB — CBC
HCT: 26.1 % — ABNORMAL LOW (ref 36.0–46.0)
Hemoglobin: 8.6 g/dL — ABNORMAL LOW (ref 12.0–15.0)
MCH: 27.8 pg (ref 26.0–34.0)
MCHC: 33 g/dL (ref 30.0–36.0)
MCV: 84.5 fL (ref 78.0–100.0)
PLATELETS: 158 10*3/uL (ref 150–400)
RBC: 3.09 MIL/uL — ABNORMAL LOW (ref 3.87–5.11)
RDW: 14.7 % (ref 11.5–15.5)
WBC: 6.9 10*3/uL (ref 4.0–10.5)

## 2017-05-09 LAB — BIRTH TISSUE RECOVERY COLLECTION (PLACENTA DONATION)

## 2017-05-09 MED ORDER — OXYCODONE HCL 5 MG PO TABS
5.0000 mg | ORAL_TABLET | ORAL | Status: DC | PRN
  Administered 2017-05-09: 5 mg via ORAL
  Filled 2017-05-09: qty 1

## 2017-05-09 NOTE — Lactation Note (Addendum)
This note was copied from a baby's chart. Lactation Consultation Note Baby 80 hrs old. Mom states the baby is cluster fed tonight. Baby currently sleeping. This is mom's 3rd child. Mom didn't BF her first, and pumped exclusively for 6 weeks for her 2nd child who is now 33 yrs old. Mom stated in that 6 weeks she had enough BM for 3 months.  Mom has large breast. Rt. Larger than Lt. Breast d/t had benign tumor 10 yrs ago. No further difficulty. Mom has flat nipples, everts w/stimulation. Rt. More so than Lt. Shells given, hand pump given to pre-pump if needed.  As soon as [place] left rm. Baby woke up, mom called for latch assistance. Assisted to Lt. Breast in football hold. Mom states she has difficulty latching to Lt. Breast. Shaft very short semi flat. Baby pushing away, fussy, will suck a few times  The pop off. Breast tissue needed to be sand which and held in baby's mouth. Baby acts as if uncomfortable or hurting. Placed to Rt. Breast latched well. Sucking w/o arching or fussing. Mom states baby BF well on this side. Rt. Nipple is more everted, LC wonders if baby experiences pain laying on Lt. Side. Will report to RN  Patient Name: Julia Rangel [id number] Date: 05/09/2017 Reason for consult: Initial assessment   Maternal Data Has patient been taught Hand Expression?: Yes Does the patient have breastfeeding experience prior to this delivery?: Yes  Feeding    LATCH Score       Type of Nipple: Flat  Comfort (Breast/Nipple): Filling, red/small blisters or bruises, mild/mod discomfort        Interventions Interventions: Breast feeding basics reviewed;Breast compression;Comfort gels;Hand pump;Support pillows;Skin to skin;Position options;Breast massage;Hand express;Shells;Pre-pump if needed  Lactation Tools Discussed/Used Tools: Shells;Pump Shell Type: Inverted Breast pump type: Manual   Consult Status Consult Status: Follow-up Date: 05/09/17 Follow-up type:  In-patient    Theodoro Kalata 05/09/2017, 5:32 AM

## 2017-05-09 NOTE — Progress Notes (Signed)
Subjective: Postpartum Day 1: Cesarean Delivery  And tubal ligation. Patient reports tolerating PO, + flatus and no problems voiding.   Breast feeding.  Last night she had some oozing from left 1/2 of incision, and pressure dressing applied. Taken off this a.m, will need to change dressing. Objective: Vital signs in last 24 hours: Temp:  [97.4 F (36.3 C)-98.6 F (37 C)] 98.1 F (36.7 C) (01/03 0400) Pulse Rate:  [73-88] 85 (01/03 0600) Resp:  [15-20] 18 (01/03 0004) BP: (95-116)/(50-76) 100/50 (01/03 0400) SpO2:  [94 %-100 %] 95 % (01/03 0600) Weight:  [234 lb (106.1 kg)] 234 lb (106.1 kg) (01/02 0720)  Physical Exam:  General: alert, cooperative and no distress Lochia: appropriate Uterine Fundus: below umbilicus Incision: healing well, dressing will benefit from changing. DVT Evaluation: No evidence of DVT seen on physical exam.  Recent Labs    05/08/17 1112 05/09/17 0559  HGB 9.3* 8.6*  HCT 28.1* 26.1*    Assessment/Plan: Status post Cesarean section. Doing well postoperatively.  Continue current care probable d/c on 1.4.Sunol 05/09/2017, 7:08 AM

## 2017-05-09 NOTE — Progress Notes (Signed)
Patient desires circumcision for her female infant.  Circumcision procedure details discussed, risks and benefits of procedure were also discussed.  These include but are not limited to: Benefits of circumcision in men include reduction in the rates of urinary tract infection (UTI), penile cancer, some sexually transmitted infections, penile inflammatory and retractile disorders, as well as easier hygiene.  Risks include bleeding , infection, injury of glans which may lead to penile deformity or urinary tract issues, unsatisfactory cosmetic appearance and other potential complications related to the procedure.  It was emphasized that this is an elective procedure.  Patient wants to proceed with circumcision; written informed consent obtained.  Will do circumcision soon, routine circumcision and post circumcision care ordered for the infant.   Almyra Free P. Amairany Schumpert, MD OB Fellow

## 2017-05-10 DIAGNOSIS — Z3A39 39 weeks gestation of pregnancy: Secondary | ICD-10-CM

## 2017-05-10 DIAGNOSIS — O34211 Maternal care for low transverse scar from previous cesarean delivery: Secondary | ICD-10-CM

## 2017-05-10 MED ORDER — OXYCODONE HCL 5 MG PO TABS: 5.0000 mg | ORAL_TABLET | ORAL | 0 refills | Status: DC | PRN

## 2017-05-10 MED ORDER — IBUPROFEN 600 MG PO TABS: 600.0000 mg | ORAL_TABLET | Freq: Four times a day (QID) | ORAL | 0 refills | Status: DC

## 2017-05-10 NOTE — Discharge Instructions (Signed)

## 2017-05-10 NOTE — Discharge Summary (Signed)
OB Discharge Summary     Patient Name: Julia Rangel DOB: Nov 23, 1985 MRN: [id number]  Date of admission: 05/08/2017 Delivering MD: Jonnie Kind   Date of discharge: 05/10/2017  Admitting diagnosis: REPEAT CS;DESIRE STERILIZATIN Intrauterine pregnancy: [redacted]w[redacted]d     Secondary diagnosis:  Active Problems:   History of cesarean delivery   Delivery with history of cesarean section  Additional problems: Gestational Diabetes, polyhydramnios     Discharge diagnosis: Term Pregnancy Delivered                                                                                                Post partum procedures:none  Augmentation: none  Complications: None  Hospital course:  Sceduled C/S   32 y.o. yo G3P3003 at [redacted]w[redacted]d was admitted to the hospital 05/08/2017 for scheduled cesarean section with the following indication:Elective Repeat.  With BTL Membrane Rupture Time/Date: 8:10 AM ,05/08/2017   Patient delivered a Viable infant.05/08/2017  Details of operation can be found in separate operative note.  Pateint had an uncomplicated postpartum course.  She is ambulating, tolerating a regular diet, passing flatus, and urinating well. Patient is discharged home in stable condition on  05/10/17         Physical exam  Vitals:   05/09/17 0400 05/09/17 0600 05/09/17 0845 05/09/17 1730  BP: (!) 100/50  (!) 112/58 (!) 116/59  Pulse: 83 85 79 92  Resp:   16 18  Temp: 98.1 F (36.7 C)  97.8 F (36.6 C) 97.6 F (36.4 C)  TempSrc: Oral  Oral Axillary  SpO2: 96% 95% 97% 100%  Weight:      Height:       General: alert, cooperative and no distress Lochia: appropriate Uterine Fundus: firm Incision: Healing well with no significant drainage, Dressing with old drainage, will change  DVT Evaluation: No evidence of DVT seen on physical exam. Labs: Lab Results  Component Value Date   WBC 6.9 05/09/2017   HGB 8.6 (L) 05/09/2017   HCT 26.1 (L) 05/09/2017   MCV 84.5 05/09/2017   PLT 158 05/09/2017   CMP  Latest Ref Rng & Units 05/08/2017  Glucose 65 - 99 mg/dL -  BUN 6 - 20 mg/dL -  Creatinine 0.44 - 1.00 mg/dL 0.47  Sodium 134 - 144 mmol/L -  Potassium 3.5 - 5.2 mmol/L -  Chloride 96 - 106 mmol/L -  CO2 18 - 29 mmol/L -  Calcium 8.7 - 10.2 mg/dL -  Total Protein 6.0 - 8.5 g/dL -  Total Bilirubin 0.0 - 1.2 mg/dL -  Alkaline Phos 39 - 117 IU/L -  AST 0 - 40 IU/L -  ALT 0 - 32 IU/L -    Discharge instruction: per After Visit Summary and "Baby and Me Booklet".  After visit meds:  Allergies as of 05/10/2017   No Known Allergies     Medication List    TAKE these medications   butalbital-acetaminophen-caffeine 50-325-40 MG tablet Commonly known as:  FIORICET, ESGIC TAKE 1 TABLET BY MOUTH EVERY 6 HOURS AS NEEDED FOR HEADACHE   ferrous sulfate 325 (65 FE) MG tablet  Take 1 tablet (325 mg total) by mouth 2 (two) times daily with a meal.   FLUoxetine 20 MG capsule Commonly known as:  PROZAC Take 1 capsule (20 mg total) daily by mouth.   glyBURIDE 2.5 MG tablet Commonly known as:  DIABETA Take 1 tablet (2.5 mg total) by mouth daily with breakfast.   ibuprofen 600 MG tablet Commonly known as:  ADVIL,MOTRIN Take 1 tablet (600 mg total) by mouth every 6 (six) hours.   oxyCODONE 5 MG immediate release tablet Commonly known as:  Oxy IR/ROXICODONE Take 1 tablet (5 mg total) by mouth every 4 (four) hours as needed for moderate pain.   PRENATAL ADULT GUMMY/DHA/FA PO Take 2 each by mouth daily. Takes 2 daily       Diet: carb modified diet  Activity: Advance as tolerated. Pelvic rest for 6 weeks.   Outpatient follow up:  2 weeks in office Follow up Appt: Future Appointments  Date Time Provider [place]  05/15/2017  9:30 AM Jonnie Kind, MD [facility]  05/17/2017 12:00 PM Florian Buff, MD [facility]   Follow up Visit:No Follow-up on file.  Postpartum contraception: Tubal Ligation  Newborn Data: Live born female  Birth Weight: 8 lb 14 oz (4025  g) APGAR: 76, 9  Newborn Delivery   Birth date/time:  05/08/2017 08:10:00 Delivery type:  C-Section, Low Transverse C-section categorization:  Repeat     Baby Feeding: Breast Disposition:home with mother   05/10/2017 Hansel Feinstein, CNM

## 2017-05-15 ENCOUNTER — Encounter: Payer: Self-pay | Admitting: Obstetrics and Gynecology

## 2017-05-15 ENCOUNTER — Ambulatory Visit (INDEPENDENT_AMBULATORY_CARE_PROVIDER_SITE_OTHER): Payer: BC Managed Care – PPO | Admitting: Obstetrics and Gynecology

## 2017-05-15 VITALS — BP 110/72 | HR 84 | Ht 66.0 in | Wt 214.0 lb

## 2017-05-15 DIAGNOSIS — Z09 Encounter for follow-up examination after completed treatment for conditions other than malignant neoplasm: Secondary | ICD-10-CM

## 2017-05-15 DIAGNOSIS — Z98891 History of uterine scar from previous surgery: Secondary | ICD-10-CM

## 2017-05-15 DIAGNOSIS — Z9889 Other specified postprocedural states: Secondary | ICD-10-CM

## 2017-05-15 NOTE — Progress Notes (Signed)
Patient ID: Julia Rangel, female   DOB: 1985/10/10, 32 y.o.   MRN: [id number]    Subjective:  Julia Rangel is a 32 y.o. female now 1 weeks status post c-section.  Wide excision of cicatrix performed By jVF on 05/08/2017    Review of Systems Negative    Diet:   normal   Bowel movements : normal.  The patient is not having any pain.  Objective:  BP 110/72 (BP Location: Left Arm, Patient Position: Sitting, Cuff Size: Large)   Pulse 84   Ht 5\' 6"  (1.676 m)   Wt 214 lb (97.1 kg)   Breastfeeding? Yes   BMI 34.54 kg/m  General:Well developed, well nourished.  No acute distress. Abdomen: Bowel sounds normal, soft, non-tender. Pelvic Exam:    Deferred  Incision(s):   Healing well, no drainage, no erythema, no hernia, no swelling, no dehiscence, excellent cosmetic appearance     Assessment:  Post-Op 1 weeks s/p c-section and excision of cicatrix   Doing well postoperatively.   Plan:  1.Wound care discussed   2. . current medications. 3. Activity restrictions: none 4. return to work: not applicable. 5. Follow up in 4 weeks.

## 2017-05-17 ENCOUNTER — Ambulatory Visit: Payer: BC Managed Care – PPO | Admitting: Obstetrics & Gynecology

## 2017-05-23 ENCOUNTER — Emergency Department (HOSPITAL_COMMUNITY)
Admission: EM | Admit: 2017-05-23 | Discharge: 2017-05-23 | Disposition: A | Discharge status: Home / Self Care | Payer: BC Managed Care – PPO | Attending: Emergency Medicine | Admitting: Emergency Medicine

## 2017-05-23 ENCOUNTER — Telehealth: Payer: Self-pay | Admitting: *Deleted

## 2017-05-23 ENCOUNTER — Encounter (HOSPITAL_COMMUNITY): Payer: Self-pay

## 2017-05-23 DIAGNOSIS — R7401 Elevation of levels of liver transaminase levels: Secondary | ICD-10-CM

## 2017-05-23 DIAGNOSIS — Z853 Personal history of malignant neoplasm of breast: Secondary | ICD-10-CM | POA: Diagnosis not present

## 2017-05-23 DIAGNOSIS — R740 Nonspecific elevation of levels of transaminase and lactic acid dehydrogenase [LDH]: Secondary | ICD-10-CM | POA: Insufficient documentation

## 2017-05-23 DIAGNOSIS — N61 Mastitis without abscess: Secondary | ICD-10-CM | POA: Diagnosis not present

## 2017-05-23 DIAGNOSIS — N644 Mastodynia: Secondary | ICD-10-CM | POA: Diagnosis present

## 2017-05-23 DIAGNOSIS — D649 Anemia, unspecified: Secondary | ICD-10-CM

## 2017-05-23 DIAGNOSIS — Z79899 Other long term (current) drug therapy: Secondary | ICD-10-CM | POA: Diagnosis not present

## 2017-05-23 LAB — URINALYSIS, ROUTINE W REFLEX MICROSCOPIC
BILIRUBIN URINE: NEGATIVE
Glucose, UA: NEGATIVE mg/dL
Ketones, ur: NEGATIVE mg/dL
Nitrite: NEGATIVE
Protein, ur: NEGATIVE mg/dL
Specific Gravity, Urine: 1.004 — ABNORMAL LOW (ref 1.005–1.030)
pH: 7 (ref 5.0–8.0)

## 2017-05-23 LAB — COMPREHENSIVE METABOLIC PANEL
ALT: 65 U/L — ABNORMAL HIGH (ref 14–54)
ANION GAP: 11 (ref 5–15)
AST: 40 U/L (ref 15–41)
Albumin: 3.4 g/dL — ABNORMAL LOW (ref 3.5–5.0)
Alkaline Phosphatase: 83 U/L (ref 38–126)
BILIRUBIN TOTAL: 0.4 mg/dL (ref 0.3–1.2)
BUN: 9 mg/dL (ref 6–20)
CHLORIDE: 102 mmol/L (ref 101–111)
CO2: 22 mmol/L (ref 22–32)
Calcium: 8.8 mg/dL — ABNORMAL LOW (ref 8.9–10.3)
Creatinine, Ser: 0.78 mg/dL (ref 0.44–1.00)
Glucose, Bld: 102 mg/dL — ABNORMAL HIGH (ref 65–99)
POTASSIUM: 3.5 mmol/L (ref 3.5–5.1)
Sodium: 135 mmol/L (ref 135–145)
TOTAL PROTEIN: 7.5 g/dL (ref 6.5–8.1)

## 2017-05-23 LAB — CBC WITH DIFFERENTIAL/PLATELET
BASOS ABS: 0 10*3/uL (ref 0.0–0.1)
Basophils Relative: 0 %
Eosinophils Absolute: 0.1 10*3/uL (ref 0.0–0.7)
Eosinophils Relative: 1 %
HEMATOCRIT: 36 % (ref 36.0–46.0)
HEMOGLOBIN: 11.3 g/dL — AB (ref 12.0–15.0)
LYMPHS ABS: 1 10*3/uL (ref 0.7–4.0)
LYMPHS PCT: 8 %
MCH: 26.7 pg (ref 26.0–34.0)
MCHC: 31.4 g/dL (ref 30.0–36.0)
MCV: 84.9 fL (ref 78.0–100.0)
Monocytes Absolute: 0.5 10*3/uL (ref 0.1–1.0)
Monocytes Relative: 4 %
NEUTROS ABS: 11.3 10*3/uL — AB (ref 1.7–7.7)
NEUTROS PCT: 87 %
PLATELETS: 260 10*3/uL (ref 150–400)
RBC: 4.24 MIL/uL (ref 3.87–5.11)
RDW: 14.5 % (ref 11.5–15.5)
WBC: 12.9 10*3/uL — AB (ref 4.0–10.5)

## 2017-05-23 LAB — I-STAT CG4 LACTIC ACID, ED
LACTIC ACID, VENOUS: 0.99 mmol/L (ref 0.5–1.9)
LACTIC ACID, VENOUS: 0.5 mmol/L (ref 0.5–1.9)

## 2017-05-23 MED ORDER — CEPHALEXIN 500 MG PO CAPS: 500.0000 mg | ORAL_CAPSULE | Freq: Four times a day (QID) | ORAL | 0 refills | Status: DC

## 2017-05-23 MED ORDER — OXYCODONE-ACETAMINOPHEN 5-325 MG PO TABS: 1.0000 | ORAL_TABLET | ORAL | 0 refills | Status: DC | PRN

## 2017-05-23 MED ORDER — MORPHINE SULFATE (PF) 4 MG/ML IV SOLN
4.0000 mg | Freq: Once | INTRAVENOUS | Status: AC
  Administered 2017-05-23: 4 mg via INTRAVENOUS
  Filled 2017-05-23: qty 1

## 2017-05-23 MED ORDER — CEFAZOLIN SODIUM-DEXTROSE 1-4 GM/50ML-% IV SOLN
1.0000 g | Freq: Once | INTRAVENOUS | Status: AC
  Administered 2017-05-23: 1 g via INTRAVENOUS
  Filled 2017-05-23: qty 50

## 2017-05-23 MED ORDER — MORPHINE SULFATE (PF) 2 MG/ML IV SOLN
INTRAVENOUS | Status: AC
  Filled 2017-05-23: qty 2

## 2017-05-23 MED ORDER — ONDANSETRON HCL 4 MG/2ML IJ SOLN
4.0000 mg | Freq: Once | INTRAMUSCULAR | Status: AC
  Administered 2017-05-23: 4 mg via INTRAVENOUS
  Filled 2017-05-23: qty 2

## 2017-05-23 MED ORDER — MORPHINE SULFATE (PF) 4 MG/ML IV SOLN
4.0000 mg | Freq: Once | INTRAVENOUS | Status: AC
  Administered 2017-05-23: 4 mg via INTRAVENOUS

## 2017-05-23 MED ORDER — SODIUM CHLORIDE 0.9 % IV BOLUS (SEPSIS)
1000.0000 mL | Freq: Once | INTRAVENOUS | Status: AC
  Administered 2017-05-23: 1000 mL via INTRAVENOUS

## 2017-05-23 NOTE — Telephone Encounter (Signed)
Patient called back stating she was seen in the ED and was diagnosed with mastitis. Received IV antibiotics and was told to f/u in a week with [place]. Just needs to make appt. Will transfer call to from desk.

## 2017-05-23 NOTE — ED Notes (Signed)
Date and time results received: 05/23/17 7:07 AM  (use smartphrase ".now" to insert current time)  Test: Troponin Critical Value: 0.21  Name of Provider Notified: Roxanne Mins  Orders Received? Or Actions Taken?: Orders Received - See Orders for details

## 2017-05-23 NOTE — ED Notes (Signed)
No 4 mg morphine in pyxis, 2 2mg  morphine taken from pyxis and given to pt.

## 2017-05-23 NOTE — Discharge Instructions (Signed)
Do not breast feed until you have gone 24 hours since your last dose of a narcotic. In the mean time, pump your breasts and discard the milk.  You may take acetaminophen and/or ibuprofen for less severe pain.

## 2017-05-23 NOTE — Telephone Encounter (Signed)
LMOVM returning patients call. REquesting information regarding if she has a fever, chills, decreased milk production. If she feels like it is a clogged duct, to try dangle feeding/pumping, warm compresses, shower, etc. Advised to call back.

## 2017-05-23 NOTE — ED Triage Notes (Signed)
Pt c/o pain and redness to left breast since yesterday, also running a fever up to 103 at home.  Pt had c-section 2 weeks ago, and is breast feeding.

## 2017-05-23 NOTE — ED Provider Notes (Signed)
[facility] EMERGENCY DEPARTMENT Provider Note   CSN: [id number] Arrival date & time: 05/23/17  5784     History   Chief Complaint Chief Complaint  Patient presents with  . Breast Pain    possible mastitis    HPI Julia Rangel is a 32 y.o. female.  The history is provided by the patient.  She is 2 weeks postpartum and breast-feeding and started having left breast pain 2 days ago.  She thought it was a clogged milk duct.  However, last evening, the pain got much worse and she started running fever up to 103.  She denies chills or sweats.  She is rates pain a 10/10.  She took a cold shower prior to coming to the ED.  She has noted some redness of her breast and is worried that she might have mastitis.  There was some bleeding, but there is been no purulent drainage.  Past Medical History:  Diagnosis Date  . Anemia   . Dysplasia of cervix   . Gestational diabetes    glyburide  . Kidney stone   . Menorrhagia   . Migraines    since age 31 per patient  . Patient desires pregnancy 06/01/2013  . 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 PMDD (premenstrual dysphoric disorder)    PROZAC 20 MG BUT NONE WITH PREGNANCY  . PONV (postoperative nausea and vomiting)   . Vaginal Pap smear, abnormal     Patient Active Problem List   Diagnosis Date Noted  . Delivery with history of cesarean section 05/08/2017  . Gestational diabetes mellitus, class A2 04/17/2017  . Polyhydramnios 03/13/2017  . Ovarian cyst 11/08/2016  . Supervision of high risk pregnancy, antepartum 10/09/2016  . History of cesarean delivery 10/09/2016  . Personal history of malignant phylloides tumor of breast 09/19/2012  . Anemia 09/19/2012  . Menorrhagia 09/19/2012  . PMDD (premenstrual dysphoric disorder) 09/19/2012  . Dysplasia of cervix 09/19/2012    Past Surgical History:  Procedure Laterality Date  . CESAREAN SECTION  2010  . CESAREAN SECTION N/A 03/29/2014   Procedure:  REPEAT CESAREAN SECTION;  Surgeon: Florian Buff, MD;  Location: [place] [facility];  Service: Obstetrics;  Laterality: N/A;  . CESAREAN SECTION WITH BILATERAL TUBAL LIGATION Bilateral 05/08/2017   Procedure: CESAREAN SECTION WITH BILATERAL TUBAL LIGATION;  Surgeon: Jonnie Kind, MD;  Location: [place];  Service: Obstetrics;  Laterality: Bilateral;  . KIDNEY STONE SURGERY  2010   stent  . LEEP  2008  . phylloid removed  2009   left breast  . TONSILLECTOMY    . WISDOM TOOTH EXTRACTION      OB History    Gravida Para Term Preterm AB Living   3 3 3     3    SAB TAB Ectopic Multiple Live Births         0 3       Home Medications    Prior to Admission medications   Medication Sig Start Date End Date Taking? Authorizing Provider  butalbital-acetaminophen-caffeine (FIORICET, ESGIC) 50-325-40 MG tablet TAKE 1 TABLET BY MOUTH EVERY 6 HOURS AS NEEDED FOR HEADACHE Patient not taking: Reported on 05/15/2017 03/10/17   Florian Buff, MD  ferrous sulfate 325 (65 FE) MG tablet Take 1 tablet (325 mg total) by mouth 2 (two) times daily with a meal. Patient not taking: Reported on 05/15/2017 05/06/17   Roma Schanz, CNM  FLUoxetine (PROZAC) 20 MG capsule  Take 1 capsule (20 mg total) daily by mouth. 03/13/17   Cresenzo-Dishmon, Joaquim Lai, CNM  glyBURIDE (DIABETA) 2.5 MG tablet Take 1 tablet (2.5 mg total) by mouth daily with breakfast. Patient not taking: Reported on 05/15/2017 04/17/17   Roma Schanz, CNM  ibuprofen (ADVIL,MOTRIN) 600 MG tablet Take 1 tablet (600 mg total) by mouth every 6 (six) hours. Patient not taking: Reported on 05/15/2017 05/10/17   Seabron Spates, CNM  oxyCODONE (OXY IR/ROXICODONE) 5 MG immediate release tablet Take 1 tablet (5 mg total) by mouth every 4 (four) hours as needed for moderate pain. Patient not taking: Reported on 05/15/2017 05/10/17   Seabron Spates, CNM  Prenatal MV & Min w/FA-DHA (PRENATAL ADULT GUMMY/DHA/FA PO) Take 2 each by mouth daily. Takes 2 daily      [provider]    Family History Family History  Problem Relation Age of Onset  . Diabetes Maternal Grandmother   . Cancer Maternal Grandmother        breast  . Diabetes Paternal Grandmother   . Cancer Paternal Grandmother        breast  . COPD Paternal Grandfather     Social History Social History   Tobacco Use  . Smoking status: Never Smoker  . Smokeless tobacco: Never Used  Substance Use Topics  . Alcohol use: No    Alcohol/week: 0.0 oz  . Drug use: No     Allergies   Patient has no known allergies.   Review of Systems Review of Systems  All other systems reviewed and are negative.    Physical Exam Updated Vital Signs BP 121/67 (BP Location: Right Arm)   Pulse (!) 128   Temp 99.8 F (37.7 C) (Oral)   Resp 20   Ht 5\' 6"  (1.676 m)   Wt 97.1 kg (214 lb)   SpO2 100%   BMI 34.54 kg/m   Physical Exam  Nursing note and vitals reviewed.  32 year old female, resting comfortably and in no acute distress. Vital signs are significant for tachycardia. Oxygen saturation is 100%, which is normal. Head is normocephalic and atraumatic. PERRLA, EOMI. Oropharynx is clear. Neck is nontender and supple without adenopathy or JVD. Back is nontender and there is no CVA tenderness. Lungs are clear without rales, wheezes, or rhonchi. Chest: There is erythema over the lateral half of the left breast with a generalized tenderness but no discrete nodule or mass.  No bloody nipple discharge.  There is tenderness extending into the left axilla but without any palpable adenopathy. Heart has regular rate and rhythm without murmur. Abdomen is soft, flat, nontender without masses or hepatosplenomegaly and peristalsis is normoactive. Extremities have no cyanosis or edema, full range of motion is present. Skin is warm and dry without rash. Neurologic: Mental status is normal, cranial nerves are intact, there are no motor or sensory deficits.  ED Treatments / Results   Labs (all labs ordered are listed, but only abnormal results are displayed) Labs Reviewed  COMPREHENSIVE METABOLIC PANEL - Abnormal; Notable for the following components:      Result Value   Glucose, Bld 102 (*)    Calcium 8.8 (*)    Albumin 3.4 (*)    ALT 65 (*)    All other components within normal limits  CBC WITH DIFFERENTIAL/PLATELET - Abnormal; Notable for the following components:   WBC 12.9 (*)    Hemoglobin 11.3 (*)    Neutro Abs 11.3 (*)    All other components within  normal limits  URINALYSIS, ROUTINE W REFLEX MICROSCOPIC - Abnormal; Notable for the following components:   Color, Urine STRAW (*)    Specific Gravity, Urine 1.004 (*)    Hgb urine dipstick LARGE (*)    Leukocytes, UA MODERATE (*)    Bacteria, UA RARE (*)    Squamous Epithelial / LPF 0-5 (*)    All other components within normal limits  CULTURE, BLOOD (ROUTINE X 2)  CULTURE, BLOOD (ROUTINE X 2)  I-STAT CG4 LACTIC ACID, ED  I-STAT CG4 LACTIC ACID, ED   Procedures Procedures (including critical care time)  Medications Ordered in ED Medications  morphine 4 MG/ML injection 4 mg (not administered)  sodium chloride 0.9 % bolus 1,000 mL (0 mLs Intravenous Stopped 05/23/17 0647)  ceFAZolin (ANCEF) IVPB 1 g/50 mL premix (0 g Intravenous Stopped 05/23/17 0645)  morphine 4 MG/ML injection 4 mg (4 mg Intravenous Given 05/23/17 0544)  ondansetron (ZOFRAN) injection 4 mg (4 mg Intravenous Given 05/23/17 0543)     Initial Impression / Assessment and Plan / ED Course  I have reviewed the triage vital signs and the nursing notes.  Pertinent lab results that were available during my care of the patient were reviewed by me and considered in my medical decision making (see chart for details).  Left mastitis.  Patient is not febrile here, but she is tachycardic.  There is no hypotension.  She is started on sepsis pathway and is given initial bolus of normal saline and is empirically started on cefazolin to treat  presumed mastitis.  Old records were reviewed confirming delivery by C-section on January 2.  Laboratory workup is unremarkable.  Only mild elevation of WBC, mild anemia unchanged from baseline.  Mild elevation of ALT is of unknown cause, but not clinically significant at this point.  She had reasonable relief of pain with small dose of morphine, but did require a second dose.  At this point, I feel that she can safely be managed with oral antibiotics.  She is discharged with prescription for cephalexin and also oxycodone-acetaminophen.  Advised not to breast-feed within 24 hours of taking any narcotic.  Return precautions discussed.  Follow-up with her obstetrician.  Final Clinical Impressions(s) / ED Diagnoses   Final diagnoses:  Mastitis, left, acute  Normochromic normocytic anemia  Elevated alanine aminotransferase (ALT) level    ED Discharge Orders        Ordered    cephALEXin (KEFLEX) 500 MG capsule  4 times daily     05/23/17 0822    oxyCODONE-acetaminophen (PERCOCET) 5-325 MG tablet  Every 4 hours PRN     79/39/03 0092       Delora Fuel, MD 33/00/76 [phone]

## 2017-05-28 LAB — CULTURE, BLOOD (ROUTINE X 2)
CULTURE: NO GROWTH
CULTURE: NO GROWTH
Special Requests: ADEQUATE
Special Requests: ADEQUATE

## 2017-05-30 ENCOUNTER — Ambulatory Visit (INDEPENDENT_AMBULATORY_CARE_PROVIDER_SITE_OTHER): Payer: BC Managed Care – PPO | Admitting: Obstetrics and Gynecology

## 2017-05-30 ENCOUNTER — Encounter: Payer: Self-pay | Admitting: Obstetrics and Gynecology

## 2017-05-30 VITALS — BP 110/70 | HR 94 | Wt 206.8 lb

## 2017-05-30 DIAGNOSIS — N61 Mastitis without abscess: Secondary | ICD-10-CM | POA: Diagnosis not present

## 2017-05-30 NOTE — Progress Notes (Signed)
[facility] Visit  05/30/2017            Patient name: Julia Rangel MRN [id number]  Date of birth: 10-21-1985  CC & HPI:  Julia Rangel is a 32 y.o. female presenting today for referral from ED for a dx of mastitis on 05/23/17. She received IV antibiotics and was told to f/u at office. Per chart review, patient started having left breast pain 9 days ago, and thought it was a clogged milk duct. She had associated symptoms of fever up to 103 F, redness of left breast. Today she states she is feeling a lot better. Her breast is still slightly tender, but not hurting as bad as it was 1 week ago. No alleviating factors noted.  ROS:  ROS  (+) mild tenderness on left breast (-) redness All systems are negative except as noted in the HPI and PMH.   Pertinent History Reviewed:   Reviewed: Significant for dysplasia of cervix, menorrhagia, abnormal pap smear, C/S, ovarian cyst, BTL,  Medical         Past Medical History:  Diagnosis Date  . Anemia   . Dysplasia of cervix   . Gestational diabetes    glyburide  . Kidney stone   . Menorrhagia   . Migraines    since age 7 per patient  . Patient desires pregnancy 06/01/2013  . 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 PMDD (premenstrual dysphoric disorder)    PROZAC 20 MG BUT NONE WITH PREGNANCY  . PONV (postoperative nausea and vomiting)   . Vaginal Pap smear, abnormal                               Surgical Hx:    Past Surgical History:  Procedure Laterality Date  . CESAREAN SECTION  2010  . CESAREAN SECTION N/A 03/29/2014   Procedure: REPEAT CESAREAN SECTION;  Surgeon: Florian Buff, MD;  Location: [place] [facility];  Service: Obstetrics;  Laterality: N/A;  . CESAREAN SECTION WITH BILATERAL TUBAL LIGATION Bilateral 05/08/2017   Procedure: CESAREAN SECTION WITH BILATERAL TUBAL LIGATION;  Surgeon: Jonnie Kind, MD;  Location: [place];  Service: Obstetrics;  Laterality: Bilateral;  . KIDNEY STONE  SURGERY  2010   stent  . LEEP  2008  . phylloid removed  2009   left breast  . TONSILLECTOMY    . WISDOM TOOTH EXTRACTION     Medications: Reviewed & Updated - see associated section                       Current Outpatient Medications:  .  cephALEXin (KEFLEX) 500 MG capsule, Take 1 capsule (500 mg total) by mouth 4 (four) times daily., Disp: 40 capsule, Rfl: 0 .  FLUoxetine (PROZAC) 20 MG capsule, Take 1 capsule (20 mg total) daily by mouth., Disp: 4230 capsule, Rfl: 6 .  glyBURIDE (DIABETA) 2.5 MG tablet, Take 1 tablet (2.5 mg total) by mouth daily with breakfast. (Patient not taking: Reported on 05/15/2017), Disp: 30 tablet, Rfl: 1 .  oxyCODONE-acetaminophen (PERCOCET) 5-325 MG tablet, Take 1 tablet by mouth every 4 (four) hours as needed for moderate pain., Disp: 20 tablet, Rfl: 0 .  Prenatal MV & Min w/FA-DHA (PRENATAL ADULT GUMMY/DHA/FA PO), Take 2 each by mouth daily. Takes 2 daily , Disp: , Rfl:    Social History: Reviewed -  reports  that  has never smoked. she has never used smokeless tobacco.  Objective Findings:  Vitals: currently breastfeeding.  Physical Examination: General appearance - alert, well appearing, and in no distress, oriented to person, place, and time and normal appearing weight Mental status - alert, oriented to person, place, and time, normal mood, behavior, speech, dress, motor activity, and thought processes Breasts - breasts appear normal, no suspicious masses, no skin or nipple changes or axillary nodes   Assessment & Plan:   A:  1. Resolved mastitis, medication completed  P:  1. F/u PRN, or as scheduled for post-op    By signing my name below, I, Izna Ahmed, attest that this documentation has been prepared under the direction and in the presence of Jonnie Kind, MD. Electronically Signed: Jabier Gauss, Medical Scribe. 05/30/17. 2:43 PM.  I personally performed the services described in this documentation, which was SCRIBED in my presence. The  recorded information has been reviewed and considered accurate. It has been edited as necessary during review. Jonnie Kind, MD

## 2017-06-12 ENCOUNTER — Ambulatory Visit (INDEPENDENT_AMBULATORY_CARE_PROVIDER_SITE_OTHER): Payer: BC Managed Care – PPO | Admitting: Obstetrics and Gynecology

## 2017-06-12 ENCOUNTER — Encounter: Payer: Self-pay | Admitting: Obstetrics and Gynecology

## 2017-06-12 MED ORDER — FLUOXETINE HCL 40 MG PO CAPS: 40.0000 mg | ORAL_CAPSULE | Freq: Every day | ORAL | 3 refills | Status: DC

## 2017-06-12 NOTE — Progress Notes (Addendum)
Patient ID: Julia Rangel, female   DOB: 1985-10-02, 32 y.o.   MRN: [id number]   Subjective:  Julia Rangel is a 32 y.o. female who presents for a 5 weeks postpartum visit  Patient concerns: She has no concerns. She states that she still experiences some pain and discomfort. She is on Prozac for depression, but notes that it is mild. However, should would like to increase the dose. She denies any other symptoms or complaints at this time.    Prenatal and intrapartum course notable for Cesarean Section with Bilateral Tubal Ligation   Patient is sexually active.   The following portions of the patient's history were reviewed and updated as appropriate: allergies, current medications, past family history, past medical history, past surgical history and problem list.  Review of Systems    See Subjective, otherwise negative ROS.  Objective:  BP 110/62 (BP Location: Right Arm, Patient Position: Sitting, Cuff Size: Normal)   Pulse 78   Ht 5\' 6"  (1.676 m)   Wt 205 lb 9.6 oz (93.3 kg)   Breastfeeding? Yes   BMI 33.18 kg/m   General:  alert, cooperative and no distress     Lungs: clear to auscultation bilaterally  Heart:  regular rate and rhythm, S1, S2 normal, no murmur  Abdomen: soft, non-tender; bowel sounds normal; no masses,  no organomegaly   Vulva:  normal  Vagina: normal vagina  Cervix:  normal  Uterus: normal size, contour, position, consistency, mobility, non-tender  Adnexa:  normal adnexa          Assessment:  1. Postpartum exam.  2. Contraception: BILATERAL TUBAL LIGATION  Plan:  1. F/U PRN 2. Increase Prozac to 40 mg 3. Pt is able to start working out, but she is advised to start easy and avoid crunches for now.      By signing my name below, I, Margit Banda, attest that this documentation has been prepared under the direction and in the presence of Jonnie Kind, MD. Electronically Signed: Margit Banda, Medical Scribe. 06/12/17. 3:05 PM.  I personally  performed the services described in this documentation, which was SCRIBED in my presence. The recorded information has been reviewed and considered accurate. It has been edited as necessary during review. Jonnie Kind, MD

## 2017-09-18 ENCOUNTER — Ambulatory Visit: Payer: BC Managed Care – PPO | Admitting: Obstetrics and Gynecology

## 2017-09-18 ENCOUNTER — Encounter: Payer: Self-pay | Admitting: Obstetrics and Gynecology

## 2017-09-18 VITALS — BP 106/60 | HR 84 | Ht 66.0 in | Wt 212.0 lb

## 2017-09-18 DIAGNOSIS — E6609 Other obesity due to excess calories: Secondary | ICD-10-CM | POA: Diagnosis not present

## 2017-09-18 DIAGNOSIS — L659 Nonscarring hair loss, unspecified: Secondary | ICD-10-CM | POA: Diagnosis not present

## 2017-09-18 MED ORDER — PHENTERMINE HCL 37.5 MG PO CAPS: 37.5000 mg | ORAL_CAPSULE | ORAL | 1 refills | Status: DC

## 2017-09-18 NOTE — Progress Notes (Signed)
See additional note this date

## 2017-09-18 NOTE — Patient Instructions (Signed)

## 2017-09-18 NOTE — Progress Notes (Signed)
Patient ID: Julia Rangel, female   DOB: [birth year], 32 y.o.   MRN: [id number]   [facility] Visit  @DATE @            Patient name: Julia Rangel MRN [id number]  Date of birth: November 10, 1985  CC & HPI:  Julia Rangel is a 32 y.o. female presenting today for recent 1 month history of hair loss that started last month, located in the temporal area bilaterally. She reports it has been coming out in chunks. She has a patch of missing hair to the right side of her scalp. There is some new short very fine hair growing in the same area. She has been eating better and trying to lose weight but she hasn't had any luck. She had been doing weight watchers prior to giving birth. She stopped when she became pregnant and has since gained weight. The patient denies fever, chills or any other symptoms or complaints at this time.  ROS:  ROS +alopecia +weight gain -fever -chills All systems are negative except as noted in the HPI and PMH.   Pertinent History Reviewed:   Reviewed: Significant for menorrhagia, PMDD, c-section, LEEP Medical         Past Medical History:  Diagnosis Date  . Anemia   . Dysplasia of cervix   . Gestational diabetes    glyburide  . Kidney stone   . Menorrhagia   . Migraines    since age 57 per patient  . Patient desires pregnancy 06/01/2013  . 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 PMDD (premenstrual dysphoric disorder)    PROZAC 20 MG BUT NONE WITH PREGNANCY  . PONV (postoperative nausea and vomiting)   . Vaginal Pap smear, abnormal                               Surgical Hx:    Past Surgical History:  Procedure Laterality Date  . CESAREAN SECTION  2010  . CESAREAN SECTION N/A 03/29/2014   Procedure: REPEAT CESAREAN SECTION;  Surgeon: Florian Buff, MD;  Location: [facility];  Service: Obstetrics;  Laterality: N/A;  . CESAREAN SECTION WITH BILATERAL TUBAL LIGATION Bilateral 05/08/2017   Procedure: CESAREAN SECTION WITH BILATERAL TUBAL  LIGATION;  Surgeon: Julia Kind, MD;  Location: [place];  Service: Obstetrics;  Laterality: Bilateral;  . KIDNEY STONE SURGERY  2010   stent  . LEEP  2008  . phylloid removed  2009   left breast  . TONSILLECTOMY    . WISDOM TOOTH EXTRACTION     Medications: Reviewed & Updated - see associated section                       Current Outpatient Medications:  .  FLUoxetine (PROZAC) 40 MG capsule, Take 1 capsule (40 mg total) by mouth daily., Disp: 30 capsule, Rfl: 3   Social History: Reviewed -  reports that she has never smoked. She has never used smokeless tobacco.  Objective Findings:  Vitals: Blood pressure 106/60, pulse 84, height 5\' 6"  (1.676 m), weight 212 lb (96.2 kg), last menstrual period 09/17/2017, not currently breastfeeding.  PHYSICAL EXAMINATION General appearance - alert, well appearing, and in no distress, oriented to person, place, and time and overweight Mental status - alert, oriented to person, place, and time, normal mood, behavior, speech, dress, motor activity, and thought processes, affect appropriate  to mood HEENT: Forehead shows thinning of the hair and a 2 cm wide by 2 cm deep area at the lateral aspects of the temple with several fine baby hairs that appear to be restarting in the area.  I think this is a normal cat again/and again, till again( catagen/anagen/telogen)cycle  PELVIC DEFERRED   Assessment & Plan:   A:  1. Alopecia 2. Obesity, weight gain  P:  1. Restart weight watchers, add Rx phentermine 2. F/u 3 months discuss weight   By signing my name below, I, Julia Rangel, attest that this documentation has been prepared under the direction and in the presence of Julia Kind, MD. Electronically Signed: Margit Rangel, Medical Scribe. 09/18/17. 9:02 AM.  I personally performed the services described in this documentation, which was SCRIBED in my presence. The recorded information has been reviewed and considered accurate.  It has been edited as necessary during review. Julia Kind, MD

## 2017-10-16 ENCOUNTER — Other Ambulatory Visit: Payer: Self-pay | Admitting: Obstetrics and Gynecology

## 2017-11-11 ENCOUNTER — Telehealth: Payer: Self-pay | Admitting: Obstetrics & Gynecology

## 2017-11-11 NOTE — Telephone Encounter (Signed)
Patient called stating she thinks she has postpartum depression.  Says she is very irritable, tearful, angry at times.  Thinks she may have had it with a previous child but was never diagnosed and this time felt like she could just "work through it".  No thoughts of hurting herself or anyone else.  Will get in with Manus Gunning on Wednesday.

## 2017-11-13 ENCOUNTER — Ambulatory Visit: Payer: Self-pay | Admitting: Advanced Practice Midwife

## 2017-11-20 ENCOUNTER — Encounter: Payer: Self-pay | Admitting: Advanced Practice Midwife

## 2017-11-20 ENCOUNTER — Ambulatory Visit: Payer: BC Managed Care – PPO | Admitting: Advanced Practice Midwife

## 2017-11-20 VITALS — BP 120/73 | HR 76 | Ht 66.0 in | Wt 202.0 lb

## 2017-11-20 DIAGNOSIS — F3289 Other specified depressive episodes: Secondary | ICD-10-CM | POA: Diagnosis not present

## 2017-11-20 MED ORDER — FLUOXETINE HCL 60 MG PO TABS: 1.0000 | ORAL_TABLET | Freq: Every day | ORAL | 6 refills | Status: DC

## 2017-11-20 NOTE — Progress Notes (Signed)
[facility] Visit  Patient name: Julia Rangel MRN [id number]  Date of birth: 1986-02-02  CC & HPI:  Julia Rangel is a 32 y.o.  female presenting today for crying spells, moodiness. Has been on ProzAC for a while, upped to 40mg  during pregnancy (delivered in January), but sx have worsened since delivery.  Discussed increasing dosage, referral to psychiatry/therapy, or both.  Wants to try increasing meds first.  Denies SI/HI  Also requests refill on phentermine (Dr Glo Herring)  Likes the energy it gives her and has lost 12 lbs in 2 mo nths.  Discussed that I have not seen people who sustain wt loss after losing it w/appetite suppressants, as metabolism seems to decrease and when pt goes back to eating normal amounts of food, they gain it back plus more.  Recommended low carb  Pertinent History Reviewed:  Medical & Surgical Hx:   Past Medical History:  Diagnosis Date  . Anemia   . Dysplasia of cervix   . Gestational diabetes    glyburide  . Kidney stone   . Menorrhagia   . Migraines    since age 73 per patient  . Patient desires pregnancy 06/01/2013  . 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 PMDD (premenstrual dysphoric disorder)    PROZAC 20 MG BUT NONE WITH PREGNANCY  . PONV (postoperative nausea and vomiting)   . Vaginal Pap smear, abnormal    Past Surgical History:  Procedure Laterality Date  . CESAREAN SECTION  2010  . CESAREAN SECTION N/A 03/29/2014   Procedure: REPEAT CESAREAN SECTION;  Surgeon: Florian Buff, MD;  Location: [facility];  Service: Obstetrics;  Laterality: N/A;  . CESAREAN SECTION WITH BILATERAL TUBAL LIGATION Bilateral 05/08/2017   Procedure: CESAREAN SECTION WITH BILATERAL TUBAL LIGATION;  Surgeon: Jonnie Kind, MD;  Location: [place];  Service: Obstetrics;  Laterality: Bilateral;  . KIDNEY STONE SURGERY  2010   stent  . LEEP  2008  . phylloid removed  2009   left breast  . TONSILLECTOMY    . WISDOM  TOOTH EXTRACTION     Family History  Problem Relation Age of Onset  . Diabetes Maternal Grandmother   . Cancer Maternal Grandmother        breast  . Diabetes Paternal Grandmother   . Cancer Paternal Grandmother        breast  . COPD Paternal Grandfather   . Breast cancer Mother     Current Outpatient Medications:  .  phentermine 37.5 MG capsule, Take 1 capsule (37.5 mg total) by mouth every morning. 2 hours before breakfast with 8 ounces water, Disp: 30 capsule, Rfl: 1 .  FLUoxetine HCl 60 MG TABS, Take 1 tablet by mouth daily., Disp: 30 tablet, Rfl: 6 Social History: Reviewed -  reports that she has never smoked. She has never used smokeless tobacco.  Review of Systems:   Constitutional: Negative for fever and chills Eyes: Negative for visual disturbances Respiratory: Negative for shortness of breath, dyspnea Cardiovascular: Negative for chest pain or palpitations  Gastrointestinal: Negative for vomiting, diarrhea and constipation; no abdominal pain Genitourinary: Negative for dysuria and urgency, vaginal irritation or itching Musculoskeletal: Negative for back pain, joint pain, myalgias  Neurological: Negative for dizziness and headaches    Objective Findings:    Physical Examination: Vitals:   11/20/17 1612  BP: 120/73  Pulse: 76   General appearance - well appearing, and in no distress Mental status -  alert, oriented to person, place, and time Chest:  Normal respiratory effort Heart - normal rate and regular rhythm Susculoskeletal:  Normal range of motion without pain Extremities:  No edema    No results found for this or any previous visit (from the past 24 hour(s)).    Assessment & Plan:  A:   Depression, worsening since delivery in january P:  Increase prozac to 60mg .  Consider therapy referral if not helping   Christin Fudge CNM 11/20/2017 5:01 PM

## 2017-12-08 ENCOUNTER — Other Ambulatory Visit: Payer: Self-pay | Admitting: Obstetrics and Gynecology

## 2017-12-11 ENCOUNTER — Encounter: Payer: Self-pay | Admitting: Genetic Counselor

## 2017-12-12 ENCOUNTER — Other Ambulatory Visit (HOSPITAL_COMMUNITY): Payer: BC Managed Care – PPO

## 2017-12-12 ENCOUNTER — Encounter (HOSPITAL_COMMUNITY): Payer: Self-pay | Admitting: Genetic Counselor

## 2017-12-12 ENCOUNTER — Inpatient Hospital Stay (HOSPITAL_COMMUNITY): Payer: BC Managed Care – PPO

## 2017-12-12 ENCOUNTER — Inpatient Hospital Stay (HOSPITAL_COMMUNITY): Payer: BC Managed Care – PPO | Attending: Genetic Counselor | Admitting: Genetic Counselor

## 2017-12-12 DIAGNOSIS — Z803 Family history of malignant neoplasm of breast: Secondary | ICD-10-CM

## 2017-12-12 DIAGNOSIS — Z8481 Family history of carrier of genetic disease: Secondary | ICD-10-CM | POA: Diagnosis not present

## 2017-12-12 NOTE — Progress Notes (Signed)
REFERRING PROVIDER: Jonnie Kind, MD [address] [place], [place] [address]  PRIMARY PROVIDER:  Asencion Noble, MD  PRIMARY REASON FOR VISIT:  1. Family history of breast cancer   2. Family history of BRCA1 gene positive      HISTORY OF PRESENT ILLNESS:   Ms. Julia Rangel, a 32 y.o. female, was seen for a [facility] cancer genetics consultation at the request of Dr. Glo Herring due to a family history of cancer.  Ms. Julia Rangel presents to clinic today, with her mother, to discuss the possibility of a hereditary predisposition to cancer, genetic testing, and to further clarify her future cancer risks, as well as potential cancer risks for family members.   Ms. Julia Rangel is a 32 y.o. female with no personal history of cancer.  She reports having cervical cancer that was treated with a LEEP and [facility] procedure at age 63. 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  This tumor was benign.  Her mother was recently diagnosed with breast cancer and was diagnosed with a BRCA1 pathogenic mutation and a PALB2 VUS.  CANCER HISTORY:   No history exists.     HORMONAL RISK FACTORS:  Menarche was at age 32.  First live birth at age 50.  OCP use for approximately 16 years.  Ovaries intact: yes.  Hysterectomy: no.  Menopausal status: premenopausal.  HRT use: 0 years. Colonoscopy: no; not examined. Mammogram within the last year: n/a. Number of breast biopsies: 0. Up to date with pelvic exams:  yes. Any excessive radiation exposure in the past:  no  Past Medical History:  Diagnosis Date  . Anemia   . Dysplasia of cervix   . Family history of BRCA1 gene positive   . Family history of breast cancer   . Gestational diabetes    glyburide  . Kidney stone   . Menorrhagia   . Migraines    since age 26 per patient  . Patient desires pregnancy 06/01/2013  . 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 PMDD (premenstrual dysphoric disorder)    PROZAC 20  MG BUT NONE WITH PREGNANCY  . PONV (postoperative nausea and vomiting)   . Vaginal Pap smear, abnormal     Past Surgical History:  Procedure Laterality Date  . CESAREAN SECTION  2010  . CESAREAN SECTION N/A 03/29/2014   Procedure: REPEAT CESAREAN SECTION;  Surgeon: Florian Buff, MD;  Location: [facility];  Service: Obstetrics;  Laterality: N/A;  . CESAREAN SECTION WITH BILATERAL TUBAL LIGATION Bilateral 05/08/2017   Procedure: CESAREAN SECTION WITH BILATERAL TUBAL LIGATION;  Surgeon: Jonnie Kind, MD;  Location: [facility];  Service: Obstetrics;  Laterality: Bilateral;  . KIDNEY STONE SURGERY  2010   stent  . LEEP  2008  . phylloid removed  2009   left breast  . TONSILLECTOMY    . WISDOM TOOTH EXTRACTION      Social History   Socioeconomic History  . Marital status: Married    Spouse name: Not on file  . Number of children: Not on file  . Years of education: Not on file  . Highest education level: Not on file  Occupational History  . Not on file  Social Needs  . Financial resource strain: Not on file  . Food insecurity:    Worry: Not on file    Inability: Not on file  . Transportation needs:    Medical: Not  on file    Non-medical: Not on file  Tobacco Use  . Smoking status: Never Smoker  . Smokeless tobacco: Never Used  Substance and Sexual Activity  . Alcohol use: Yes    Alcohol/week: 0.0 standard drinks    Comment: occ  . Drug use: No  . Sexual activity: Yes    Birth control/protection: Surgical    Comment: tubal  Lifestyle  . Physical activity:    Days per week: Not on file    Minutes per session: Not on file  . Stress: Not on file  Relationships  . Social connections:    Talks on phone: Not on file    Gets together: Not on file    Attends religious service: Not on file    Active member of club or organization: Not on file    Attends meetings of clubs or organizations: Not on file    Relationship status: Not on file  Other Topics Concern  .  Not on file  Social History Narrative  . Not on file     FAMILY HISTORY:  We obtained a detailed, 4-generation family history.  Significant diagnoses are listed below: Family History  Problem Relation Age of Onset  . Diabetes Maternal Grandmother   . Breast cancer Maternal Grandmother        dx 40s-50  . Diabetes Paternal Grandmother   . Breast cancer Paternal Grandmother 33  . COPD Paternal Grandfather   . Heart disease Maternal Grandfather   . Breast cancer Mother 79  . BRCA 1/2 Mother        BRCA1 pos  . Melanoma Sister 89  . Colon polyps Maternal Uncle   . SIDS Maternal Aunt        2 babies died of SIDS at 47 months    The patient has two daughters and one son who are all cancer free.  She has one sister who was possibly diagnosed with melanoma at 62.  Both parents are living.  The pateint's mother is 64 and was diagnosed with breast cancer.  She has a known BRCA1 mutation.  Her mother has a brother and sister who are both cancer free.  The patient's maternal grandmother had breast cancer in her 65s and had thyroid cancer in her 18s-50s.  This grandmother has a sister who had breast cancer at 13.  The grandfather is deceased.  The patient's father is living at 101.  He has two full sisters whoa re cancer free, and six half siblings, from both sides of his family, who are cancer free.  The paternal grandfather is deceased from COPD, and the grandmother had breast cancer at 41 and is now 72.  Patient's maternal ancestors are of [place] descent, and paternal ancestors are of Caucasian [place] descent. There is no reported Ashkenazi Jewish ancestry. There is no known consanguinity.  GENETIC COUNSELING ASSESSMENT: Luisana Lutzke is a 32 y.o. female with a family history of breast cancer and a known family mutation in [place] which is suggestive of a hereditary cancer syndrome, such as hereditary breast and ovarian cancer syndrome, and predisposition to cancer. We, therefore,  discussed and recommended the following at today's visit.   DISCUSSION: We discussed that about 5-10% of breast cancer is hereditary with most cases due to BRCA mutations.  The patient's mother has a known BRCA1 mutation and a PALB2 VUS.  Therefore the patient has a 50% chance of also having the mutation and/or the VUS.  She also has  a family history of breast cancer on her father's side of the family.  The grandmother was diagnosed at 42, so at the average age of diagnosis.  Therefore, this is not considered to be at a hereditary risk.  We disucssed that the genetic testing laboratory will cover genetic testing for the patient for free for the BRCA1 and PALB2 variants.  We could go through insurance and do a full panel to ensure that there is nothing else going on based on her phyllodes tumor and the melanoma in her sister at a young age.  Most benign phyllodes tumors are not hereditary, however, malignant ones have an increased risk for being the result of a TP53 mutation.    We reviewed the characteristics, features and inheritance patterns of hereditary cancer syndromes. We also discussed genetic testing, including the appropriate family members to test, the process of testing, insurance coverage and turn-around-time for results. We discussed the implications of a negative, positive and/or variant of uncertain significant result. We recommended Ms. Kassin pursue genetic testing for the multicancer gene panel.   Based on Ms. Coatney's family history of cancer, she meets medical criteria for genetic testing. Despite that she meets criteria, she may still have an out of pocket cost. We discussed that if her out of pocket cost for testing is over $100, the laboratory will call and confirm whether she wants to proceed with testing.  If the out of pocket cost of testing is less than $100 she will be billed by the genetic testing laboratory.   PLAN: After considering the risks, benefits, and limitations, Ms. Temkin   provided informed consent to pursue genetic testing and the blood sample was sent to [facility] for analysis of the Multicancer panel. Results should be available within approximately 2-3 weeks' time, at which point they will be disclosed by telephone to Ms. Maulden, as will any additional recommendations warranted by these results. Ms. Laflam will receive a summary of her genetic counseling visit and a copy of her results once available. This information will also be available in Epic. We encouraged Ms. Perno to remain in contact with cancer genetics annually so that we can continuously update the family history and inform her of any changes in cancer genetics and testing that may be of benefit for her family. Ms. Haertel questions were answered to her satisfaction today. Our contact information was provided should additional questions or concerns arise.  Lastly, we encouraged Ms. Stryker to remain in contact with cancer genetics annually so that we can continuously update the family history and inform her of any changes in cancer genetics and testing that may be of benefit for this family.   Ms.  Schwarz questions were answered to her satisfaction today. Our contact information was provided should additional questions or concerns arise. Thank you for the referral and allowing [place] to share in the care of your patient.   Lusine Corlett P. Florene Glen, [place], [facility] Certified Genetic Counselor [email or web address] phone: [phone]  The patient was seen for a total of 40 minutes in face-to-face genetic counseling.  This patient was discussed with Drs. Magrinat, Lindi Adie and/or Burr Medico who agrees with the above.    _______________________________________________________________________ For Office Staff:  Number of people involved in session: 2 Was an Intern/ student involved with case: no

## 2017-12-19 ENCOUNTER — Encounter: Payer: Self-pay | Admitting: Obstetrics and Gynecology

## 2017-12-19 ENCOUNTER — Ambulatory Visit: Payer: BC Managed Care – PPO | Admitting: Obstetrics and Gynecology

## 2017-12-19 VITALS — BP 126/83 | HR 90 | Ht 66.0 in | Wt 203.0 lb

## 2017-12-19 DIAGNOSIS — N63 Unspecified lump in unspecified breast: Secondary | ICD-10-CM

## 2017-12-19 MED ORDER — PHENTERMINE HCL 37.5 MG PO CAPS: 37.5000 mg | ORAL_CAPSULE | ORAL | 1 refills | Status: DC

## 2017-12-19 NOTE — Progress Notes (Signed)
Patient ID: Julia Rangel, female   DOB: 01-01-1986, 32 y.o.   MRN: [id number]    [facility] Visit  '@DATE' @            Patient name: Julia Rangel MRN [id number]  Date of birth: [birth year]  CC & HPI:  Julia Rangel is a 32 y.o. female presenting today for a F/U about her alopecia. Her hair is growing back. She also has a knot in her left breast. It is in the same location as a phyllodes tumor that was removed by Dr. Bernette Mayers 10 years ago. She has a FHx of breast cancer and does self exams regularly. She was seen here in January 2019 for mastitis of the left breast that was resolved on its own. She has a 43 month old at home and stopped breastfeeding a few months ago.She is still lactating   Her mother recently tested positive for the BRCA-1 gene and the pt is waiting on her own results, which will be forwarded to this office.. The patient denies fever, chills or any other symptoms or complaints at this time.   ROS:  ROS + left breast knot - fever  - chills All systems are negative except as noted in the HPI and PMH.   Pertinent History Reviewed:   Reviewed: Significant for FHx of breast cancer Medical         Past Medical History:  Diagnosis Date  . Anemia   . Dysplasia of cervix   . Family history of BRCA1 gene positive   . Family history of breast cancer   . Gestational diabetes    glyburide  . Kidney stone   . Menorrhagia   . Migraines    since age 40 per patient  . Patient desires pregnancy 06/01/2013  . 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 PMDD (premenstrual dysphoric disorder)    PROZAC 20 MG BUT NONE WITH PREGNANCY  . PONV (postoperative nausea and vomiting)   . Vaginal Pap smear, abnormal                               Surgical Hx:    Past Surgical History:  Procedure Laterality Date  . CESAREAN SECTION  2010  . CESAREAN SECTION N/A 03/29/2014   Procedure: REPEAT CESAREAN SECTION;  Surgeon: Florian Buff, MD;  Location: [facility];  Service: Obstetrics;  Laterality: N/A;  . CESAREAN SECTION WITH BILATERAL TUBAL LIGATION Bilateral 05/08/2017   Procedure: CESAREAN SECTION WITH BILATERAL TUBAL LIGATION;  Surgeon: Jonnie Kind, MD;  Location: [facility];  Service: Obstetrics;  Laterality: Bilateral;  . KIDNEY STONE SURGERY  2010   stent  . LEEP  2008  . phylloid removed  2009   left breast  . TONSILLECTOMY    . WISDOM TOOTH EXTRACTION     Medications: Reviewed & Updated - see associated section                       Current Outpatient Medications:  .  FLUoxetine HCl 60 MG TABS, Take 1 tablet by mouth daily., Disp: 30 tablet, Rfl: 6 .  phentermine 37.5 MG capsule, Take 1 capsule (37.5 mg total) by mouth every morning. 2 hours before breakfast with 8 ounces water, Disp: 30 capsule, Rfl: 1  Social History: Reviewed -  reports that she has never smoked. She has never used  smokeless tobacco.  Objective Findings:  Vitals: Blood pressure 126/83, pulse 90, height '5\' 6"'  (1.676 m), weight 203 lb (92.1 kg), last menstrual period 12/05/2017, not currently breastfeeding.  PHYSICAL EXAMINATION General appearance - alert, well appearing, and in no distress and oriented to person, place, and time Mental status - alert, oriented to person, place, and time, normal mood, behavior, speech, dress, motor activity, and thought processes, affect appropriate to mood Breast: Firm nodule on left breast, less than 50m. Tissue around it is not stuck to it. Likely scar tissue.barely perceptible deep in the area of prior biopsy, adjacent to a thinned out are.  PELVIC DEFERRED  Assessment & Plan:   A:  1.  tiny 3-5 mm Left breast firmness. Likely scar tissue. Will follow for changes 2 hx phylloides tumor of left breast. Weight loss on prior Phentermine regimen and weight loss effort. P:  1. Monitor any changes in breast knot 2. Refill Rx Phentermine 37.5 x 2 months.  3. F/U prn  By signing my name below, I, EDe Burrs  attest that this documentation has been prepared under the direction and in the presence of FJonnie Kind MD. Electronically Signed: EDe Burrs Medical Scribe. 12/19/17. 4:52 PM.  I personally performed the services described in this documentation, which was SCRIBED in my presence. The recorded information has been reviewed and considered accurate. It has been edited as necessary during review. JJonnie Kind MD

## 2018-01-02 ENCOUNTER — Encounter: Payer: Self-pay | Admitting: Genetic Counselor

## 2018-01-02 ENCOUNTER — Ambulatory Visit: Payer: Self-pay | Admitting: Genetic Counselor

## 2018-01-02 ENCOUNTER — Telehealth: Payer: Self-pay | Admitting: Genetic Counselor

## 2018-01-02 DIAGNOSIS — Z1379 Encounter for other screening for genetic and chromosomal anomalies: Secondary | ICD-10-CM

## 2018-01-02 DIAGNOSIS — Z8481 Family history of carrier of genetic disease: Secondary | ICD-10-CM

## 2018-01-02 NOTE — Telephone Encounter (Signed)
LM on VM that results were back and to please call. ?

## 2018-01-02 NOTE — Progress Notes (Signed)
HPI:  Ms. Borger was previously seen in the [facility] clinic due to a family history of cancer, a known BRCA1 mutation, and concerns regarding a hereditary predisposition to cancer. Please refer to our prior cancer genetics clinic note for more information regarding Ms. Sobolewski's medical, social and family histories, and our assessment and recommendations, at the time. Ms. Mccollister recent genetic test results were disclosed to her, as were recommendations warranted by these results. These results and recommendations are discussed in more detail below.  CANCER HISTORY:   No history exists.    FAMILY HISTORY:  We obtained a detailed, 4-generation family history.  Significant diagnoses are listed below: Family History  Problem Relation Age of Onset  . Diabetes Maternal Grandmother   . Breast cancer Maternal Grandmother        dx 40s-50  . Diabetes Paternal Grandmother   . Breast cancer Paternal Grandmother 52  . COPD Paternal Grandfather   . Heart disease Maternal Grandfather   . Breast cancer Mother 40  . BRCA 1/2 Mother        BRCA1 pos  . Colon polyps Maternal Uncle   . SIDS Maternal Aunt        2 babies died of SIDS at 68 months    The patient has two daughters and one son who are all cancer free.  She has one sister who was possibly diagnosed with melanoma at 72.  Both parents are living.  The patient's mother is 76 and was diagnosed with breast cancer.  She has a known BRCA1 mutation.  Her mother has a brother and sister who are both cancer free.  The patient's maternal grandmother had breast cancer in her 25's and had thyroid cancer in her 5's-50's.  This grandmother has a sister who had breast cancer at 90.  The grandfather is deceased.  The patient's father is living at 19.  He has two full sisters whoa re cancer free, and six half siblings, from both sides of his family, who are cancer free.  The paternal grandfather is deceased from COPD, and the grandmother had  breast cancer at 52 and is now 15.  Patient's maternal ancestors are of [place] and [place] descent, and paternal ancestors are of Caucasian [place] descent. There is no reported Ashkenazi Jewish ancestry. There is no known consanguinity.  GENETIC TEST RESULTS: We recommended Ms. Pursley pursue testing for the familial hereditary cancer gene mutation called BRCA1, [phone] (p.Leu392Glnfs*5) and the VUS found in CDH1 called, c.2267G>A (p.Cys754Tyr).  Ms. Howlett decided that she wanted to have additional testing in the form of the Multi-cancer panel.  The Multi-Gene Panel offered by [facility] includes sequencing and/or deletion duplication testing of the following 84 genes: AIP, ALK, APC, ATM, AXIN2,BAP1,  BARD1, BLM, BMPR1A, BRCA1, BRCA2, BRIP1, CASR, CDC73, CDH1, CDK4, CDKN1B, CDKN1C, CDKN2A (p14ARF), CDKN2A (p16INK4a), CEBPA, CHEK2, CTNNA1, DICER1, DIS3L2, EGFR (c.2369C>T, p.Thr790Met variant only), EPCAM (Deletion/duplication testing only), FH, FLCN, GATA2, GPC3, GREM1 (Promoter region deletion/duplication testing only), HOXB13 (c.251G>A, p.Gly84Glu), HRAS, KIT, MAX, MEN1, MET, MITF (c.952G>A, p.Glu318Lys variant only), MLH1, MSH2, MSH3, MSH6, MUTYH, NBN, NF1, NF2, NTHL1, PALB2, PDGFRA, PHOX2B, PMS2, POLD1, POLE, POT1, PRKAR1A, PTCH1, PTEN, RAD50, RAD51C, RAD51D, RB1, RECQL4, RET, RUNX1, SDHAF2, SDHA (sequence changes only), SDHB, SDHC, SDHD, SMAD4, SMARCA4, SMARCB1, SMARCE1, STK11, SUFU, TERC, TERT, TMEM127, TP53, TSC1, TSC2, VHL, WRN and WT1.  Ms. Khurana test was normal and did not reveal the familial mutation. We call this result a true negative result because the  cancer-causing mutation was identified in Ms. Procida family, and she did not inherit it.  Given this negative result, Ms. Fidalgo chances of developing BRCA-related cancers are the same as they are in the general population.    CANCER SCREENING RECOMMENDATIONS: This normal result is reassuring and indicates that Ms. Brimley does not likely have an  increased risk of cancer due to a mutation in one of these genes.  We, therefore, recommended  Ms. Krogh continue to follow the cancer screening guidelines provided by her primary healthcare providers.   An individual's cancer risk and medical management are not determined by genetic test results alone. Overall cancer risk assessment incorporates additional factors, including personal medical history, family history, and any available genetic information that may result in a personalized plan for cancer prevention and surveillance.  RECOMMENDATIONS FOR FAMILY MEMBERS:  Women in this family might be at some increased risk of developing cancer, over the general population risk, simply due to the family history of cancer.  We recommended women in this family have a yearly mammogram beginning at age 85, or 29 years younger than the earliest onset of cancer, an annual clinical breast exam, and perform monthly breast self-exams. Women in this family should also have a gynecological exam as recommended by their primary provider. All family members should have a colonoscopy by age 19.  FOLLOW-UP: Lastly, we discussed with Ms. Ranker that cancer genetics is a rapidly advancing field and it is possible that new genetic tests will be appropriate for her and/or her family members in the future. We encouraged her to remain in contact with cancer genetics on an annual basis so we can update her personal and family histories and let her know of advances in cancer genetics that may benefit this family.   Our contact number was provided. Ms. Kearl questions were answered to her satisfaction, and she knows she is welcome to call us at anytime with additional questions or concerns.   Roma Kayser, MS, [facility] Certified Genetic Counselor [email or web address]

## 2018-06-26 ENCOUNTER — Ambulatory Visit: Payer: BC Managed Care – PPO | Admitting: Advanced Practice Midwife

## 2018-06-26 ENCOUNTER — Encounter: Payer: Self-pay | Admitting: Advanced Practice Midwife

## 2018-06-26 VITALS — BP 117/77 | HR 73 | Ht 66.0 in | Wt 210.0 lb

## 2018-06-26 DIAGNOSIS — N63 Unspecified lump in unspecified breast: Secondary | ICD-10-CM

## 2018-06-26 NOTE — Progress Notes (Signed)
[facility] Visit  Patient name: Julia Rangel MRN [id number]  Date of birth: 02-Sep-1985  CC & HPI:  Julia Rangel is a 33 y.o. Caucasian female presenting today for left breast pain/lump for past 6 months.  It is in the same location as a phyllodes tumor that was removed by Dr. Bernette Mayers 10 years ago.  Was seen here in August for it, was deemed to be probably scar tissue, told to fu if changes.  Now it is painful, larger to pt. Tested neg for BrCa gene!   Pertinent History Reviewed:  Medical & Surgical Hx:   Past Medical History:  Diagnosis Date  . Anemia   . Dysplasia of cervix   . Family history of BRCA1 gene positive   . Family history of breast cancer   . Gestational diabetes    glyburide  . Kidney stone   . Menorrhagia   . Migraines    since age 62 per patient  . Patient desires pregnancy 06/01/2013  . 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 PMDD (premenstrual dysphoric disorder)    PROZAC 20 MG BUT NONE WITH PREGNANCY  . PONV (postoperative nausea and vomiting)   . Vaginal Pap smear, abnormal    Past Surgical History:  Procedure Laterality Date  . CESAREAN SECTION  2010  . CESAREAN SECTION N/A 03/29/2014   Procedure: REPEAT CESAREAN SECTION;  Surgeon: Florian Buff, MD;  Location: [facility];  Service: Obstetrics;  Laterality: N/A;  . CESAREAN SECTION WITH BILATERAL TUBAL LIGATION Bilateral 05/08/2017   Procedure: CESAREAN SECTION WITH BILATERAL TUBAL LIGATION;  Surgeon: Jonnie Kind, MD;  Location: [facility];  Service: Obstetrics;  Laterality: Bilateral;  . KIDNEY STONE SURGERY  2010   stent  . LEEP  2008  . phylloid removed  2009   left breast  . TONSILLECTOMY    . WISDOM TOOTH EXTRACTION     Family History  Problem Relation Age of Onset  . Diabetes Maternal Grandmother   . Breast cancer Maternal Grandmother        dx 40s-50  . Diabetes Paternal Grandmother   . Breast cancer Paternal Grandmother 29  . COPD  Paternal Grandfather   . Heart disease Maternal Grandfather   . Breast cancer Mother 29  . BRCA 1/2 Mother        BRCA1 pos  . Colon polyps Maternal Uncle   . SIDS Maternal Aunt        2 babies died of SIDS at 4 months    Current Outpatient Medications:  .  FLUoxetine HCl 60 MG TABS, Take 1 tablet by mouth daily., Disp: 30 tablet, Rfl: 6 .  phentermine 37.5 MG capsule, Take 1 capsule (37.5 mg total) by mouth every morning. 2 hours before breakfast with 8 ounces water, Disp: 30 capsule, Rfl: 1 Social History: Reviewed -  reports that she has never smoked. She has never used smokeless tobacco.  Review of Systems:   Constitutional: Negative for fever and chills Eyes: Negative for visual disturbances Respiratory: Negative for shortness of breath, dyspnea Cardiovascular: Negative for chest pain or palpitations  Gastrointestinal: Negative for vomiting, diarrhea and constipation; no abdominal pain Genitourinary: Negative for dysuria and urgency, vaginal irritation or itching Musculoskeletal: Negative for back pain, joint pain, myalgias  Neurological: Negative for dizziness and headaches    Objective Findings:    Physical Examination: Vitals:   06/26/18 1239  BP: 117/77  Pulse: 73  General appearance - well appearing, and in no distress Mental status - alert, oriented to person, place, and time Chest:  Normal respiratory effort Breast:  Left breast ? Nodule, ? Scar tissue Heart - normal rate and regular rhythm Musculoskeletal:  Normal range of motion without pain Extremities:  No edema    No results found for this or any previous visit (from the past 24 hour(s)).    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  Christin Fudge CNM 06/26/2018 12:56 PM

## 2018-07-03 ENCOUNTER — Other Ambulatory Visit: Payer: Self-pay | Admitting: Advanced Practice Midwife

## 2018-07-07 NOTE — Telephone Encounter (Signed)
Note sent to nurse. 

## 2018-07-08 ENCOUNTER — Ambulatory Visit (HOSPITAL_COMMUNITY)
Admission: RE | Admit: 2018-07-08 | Discharge: 2018-07-08 | Disposition: A | Discharge status: Home / Self Care | Payer: BC Managed Care – PPO | Source: Ambulatory Visit | Attending: Advanced Practice Midwife | Admitting: Advanced Practice Midwife

## 2018-07-08 ENCOUNTER — Ambulatory Visit (HOSPITAL_COMMUNITY): Admission: RE | Admit: 2018-07-08 | Payer: BC Managed Care – PPO | Source: Ambulatory Visit

## 2018-07-08 DIAGNOSIS — N63 Unspecified lump in unspecified breast: Secondary | ICD-10-CM | POA: Diagnosis present

## 2018-12-01 ENCOUNTER — Other Ambulatory Visit: Payer: Self-pay | Admitting: Advanced Practice Midwife

## 2018-12-01 ENCOUNTER — Telehealth: Payer: Self-pay | Admitting: Obstetrics and Gynecology

## 2018-12-01 MED ORDER — FLUOXETINE HCL 40 MG PO CAPS: 40.0000 mg | ORAL_CAPSULE | Freq: Every day | ORAL | 6 refills | Status: DC

## 2018-12-01 MED ORDER — FLUOXETINE HCL 20 MG PO TABS: 20.0000 mg | ORAL_TABLET | Freq: Every day | ORAL | 6 refills | Status: DC

## 2018-12-01 NOTE — Progress Notes (Signed)
prozac 40/20 (insurace co request) sent to pharmacy

## 2018-12-01 NOTE — Telephone Encounter (Signed)
Pt states that her prozac is needing a prior auth. She is also wanting to see if something similar can be ordered in place of the medication?

## 2018-12-01 NOTE — Telephone Encounter (Signed)
Received info from pt insurance company. They do not cover Fluoxetine 60 mg. They prefer Fluoxetine 40 mg and 20 mg tablets seperately, or Trintellix, or Viibryd.

## 2018-12-04 ENCOUNTER — Ambulatory Visit: Payer: BC Managed Care – PPO | Admitting: Orthopaedic Surgery

## 2018-12-04 ENCOUNTER — Ambulatory Visit: Payer: BC Managed Care – PPO

## 2018-12-04 ENCOUNTER — Encounter: Payer: Self-pay | Admitting: Orthopaedic Surgery

## 2018-12-04 ENCOUNTER — Other Ambulatory Visit: Payer: Self-pay

## 2018-12-04 VITALS — BP 107/78 | HR 83 | Temp 98.1°F | Ht 66.0 in | Wt 209.0 lb

## 2018-12-04 DIAGNOSIS — G8929 Other chronic pain: Secondary | ICD-10-CM

## 2018-12-04 DIAGNOSIS — M25511 Pain in right shoulder: Secondary | ICD-10-CM

## 2018-12-04 NOTE — Progress Notes (Addendum)
Subjective:    Patient ID: Julia Rangel, female    DOB: 12/08/1985, 33 y.o.   MRN: [id number]  HPI She has had pain in the right shoulder for several months. It is getting worse. She has pain raising her arm over head and sleeping on it.  She has a one year old and this has not helped.  She has no trauma, no redness, no numbness.  Nothing seems to make it better.  She has tried rest, heat, ice and rubs as well as Advil. She is tired of hurting.  She looked up exercises to do on the internet and that has just made her worse.   Review of Systems  Constitutional: Positive for activity change.  Musculoskeletal: Positive for arthralgias.  Neurological: Positive for headaches.  All other systems reviewed and are negative.  For Review of Systems, all other systems reviewed and are negative.  The following is a summary of the past history medically, past history surgically, known current medicines, social history and family history.  This information is gathered electronically by the computer from prior information and documentation.  I review this each visit and have found including this information at this point in the chart is beneficial and informative.   Past Medical History:  Diagnosis Date  . Anemia   . Dysplasia of cervix   . Family history of BRCA1 gene positive   . Family history of breast cancer   . Gestational diabetes    glyburide  . Kidney stone   . Menorrhagia   . Migraines    since age 49 per patient  . Patient desires pregnancy 06/01/2013  . 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 PMDD (premenstrual dysphoric disorder)    PROZAC 20 MG BUT NONE WITH PREGNANCY  . PONV (postoperative nausea and vomiting)   . Vaginal Pap smear, abnormal     Past Surgical History:  Procedure Laterality Date  . CESAREAN SECTION  2010  . CESAREAN SECTION N/A 03/29/2014   Procedure: REPEAT CESAREAN SECTION;  Surgeon: Florian Buff, MD;  Location: [facility];   Service: Obstetrics;  Laterality: N/A;  . CESAREAN SECTION WITH BILATERAL TUBAL LIGATION Bilateral 05/08/2017   Procedure: CESAREAN SECTION WITH BILATERAL TUBAL LIGATION;  Surgeon: Jonnie Kind, MD;  Location: [place];  Service: Obstetrics;  Laterality: Bilateral;  . KIDNEY STONE SURGERY  2010   stent  . LEEP  2008  . phylloid removed  2009   left breast  . TONSILLECTOMY    . WISDOM TOOTH EXTRACTION      Current Outpatient Medications on File Prior to Visit  Medication Sig Dispense Refill  . FLUoxetine (PROZAC) 20 MG tablet Take 1 tablet (20 mg total) by mouth daily. 30 tablet 6  . FLUoxetine (PROZAC) 40 MG capsule Take 1 capsule (40 mg total) by mouth daily. In addition to the 110m prozac for a total of 671mday 30 capsule 6  . phentermine 37.5 MG capsule Take 1 capsule (37.5 mg total) by mouth every morning. 2 hours before breakfast with 8 ounces water (Patient not taking: Reported on 12/04/2018) 30 capsule 1   No current facility-administered medications on file prior to visit.     Social History   Socioeconomic History  . Marital status: Married    Spouse name: Not on file  . Number of children: Not on file  . Years of education: Not on file  . Highest education level: Not on  file  Occupational History  . Not on file  Social Needs  . Financial resource strain: Not on file  . Food insecurity    Worry: Not on file    Inability: Not on file  . Transportation needs    Medical: Not on file    Non-medical: Not on file  Tobacco Use  . Smoking status: Never Smoker  . Smokeless tobacco: Never Used  Substance and Sexual Activity  . Alcohol use: Yes    Alcohol/week: 0.0 standard drinks    Comment: occ  . Drug use: No  . Sexual activity: Yes    Birth control/protection: Surgical    Comment: tubal  Lifestyle  . Physical activity    Days per week: Not on file    Minutes per session: Not on file  . Stress: Not on file  Relationships  . Social Product manager on phone: Not on file    Gets together: Not on file    Attends religious service: Not on file    Active member of club or organization: Not on file    Attends meetings of clubs or organizations: Not on file    Relationship status: Not on file  . Intimate partner violence    Fear of current or ex partner: Not on file    Emotionally abused: Not on file    Physically abused: Not on file    Forced sexual activity: Not on file  Other Topics Concern  . Not on file  Social History Narrative  . Not on file    Family History  Problem Relation Age of Onset  . Diabetes Maternal Grandmother   . Breast cancer Maternal Grandmother        dx 40s-50  . Diabetes Paternal Grandmother   . Breast cancer Paternal Grandmother 60  . COPD Paternal Grandfather   . Heart disease Maternal Grandfather   . Breast cancer Mother 29  . BRCA 1/2 Mother        BRCA1 pos  . Colon polyps Maternal Uncle   . SIDS Maternal Aunt        2 babies died of SIDS at 4 months    BP 107/78   Pulse 83   Temp 98.1 F (36.7 C)   Ht '5\' 6"'  (1.676 m)   Wt 209 lb (94.8 kg)   LMP 12/01/2018 (Approximate)   BMI 33.73 kg/m   Body mass index is 33.73 kg/m.     Objective:   Physical Exam Vitals signs reviewed.  Constitutional:      Appearance: She is well-developed.  HENT:     Head: Normocephalic and atraumatic.  Eyes:     Conjunctiva/sclera: Conjunctivae normal.     Pupils: Pupils are equal, round, and reactive to light.  Neck:     Musculoskeletal: Normal range of motion and neck supple.  Cardiovascular:     Rate and Rhythm: Normal rate and regular rhythm.  Pulmonary:     Effort: Pulmonary effort is normal.  Abdominal:     Palpations: Abdomen is soft.  Musculoskeletal:     Right shoulder: She exhibits decreased range of motion and tenderness.       Arms:  Skin:    General: Skin is warm and dry.  Neurological:     Mental Status: She is alert and oriented to person, place, and time.     Cranial  Nerves: No cranial nerve deficit.     Motor: No abnormal muscle tone.  Coordination: Coordination normal.     Deep Tendon Reflexes: Reflexes are normal and symmetric. Reflexes normal.  Psychiatric:        Behavior: Behavior normal.        Thought Content: Thought content normal.        Judgment: Judgment normal.       X-rays were done of the right shoulder, reported separately.  Negative.     Assessment & Plan:   Encounter Diagnosis  Name Primary?  . Chronic right shoulder pain Yes   I have told her she has bursitis of the shoulder and may have a rotator cuff tear.  I will inject.  I will have her begin Aleve two tablets twice a day, samples given.  PROCEDURE NOTE:  The patient request injection, verbal consent was obtained.  The right shoulder was prepped appropriately after time out was performed.   Sterile technique was observed and injection of 1 cc of Depo-Medrol 40 mg with several cc's of plain xylocaine. Anesthesia was provided by ethyl chloride and a 20-gauge needle was used to inject the shoulder area. A posterior approach was used.  The injection was tolerated well.  A band aid dressing was applied.  The patient was advised to apply ice later today and tomorrow to the injection sight as needed.   Return in two weeks.  Consider OT or MRI if not improved.  Exercises given.  Call if any problem.  Precautions discussed.   Electronically Signed Sanjuana Kava, MD 7/30/20209:52 AM

## 2018-12-18 ENCOUNTER — Encounter: Payer: Self-pay | Admitting: Orthopaedic Surgery

## 2018-12-18 ENCOUNTER — Ambulatory Visit: Payer: BC Managed Care – PPO | Admitting: Orthopaedic Surgery

## 2018-12-18 ENCOUNTER — Other Ambulatory Visit: Payer: Self-pay

## 2018-12-18 VITALS — BP 120/68 | HR 81 | Ht 66.0 in | Wt 209.0 lb

## 2018-12-18 DIAGNOSIS — G8929 Other chronic pain: Secondary | ICD-10-CM | POA: Diagnosis not present

## 2018-12-18 DIAGNOSIS — M25511 Pain in right shoulder: Secondary | ICD-10-CM | POA: Diagnosis not present

## 2018-12-18 NOTE — Progress Notes (Signed)
Patient UX:Julia Rangel, female DOB:[birth year], 33 y.o. UVO:[id number]  Chief Complaint  Patient presents with  . Shoulder Pain    Right shoulder pain.    HPI  Julia Rangel is a 33 y.o. female who continues to have pain of the right shoulder.  The injection did not help. She is taking Aleve.  I will begin OT.  I am concerned about a rotator cuff injury.   Body mass index is 33.73 kg/m.  ROS  Review of Systems  Constitutional: Positive for activity change.  Musculoskeletal: Positive for arthralgias.  Neurological: Positive for headaches.  All other systems reviewed and are negative.   All other systems reviewed and are negative.  The following is a summary of the past history medically, past history surgically, known current medicines, social history and family history.  This information is gathered electronically by the computer from prior information and documentation.  I review this each visit and have found including this information at this point in the chart is beneficial and informative.    Past Medical History:  Diagnosis Date  . Anemia   . Dysplasia of cervix   . Family history of BRCA1 gene positive   . Family history of breast cancer   . Gestational diabetes    glyburide  . Kidney stone   . Menorrhagia   . Migraines    since age 21 per patient  . Patient desires pregnancy 06/01/2013  . 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 PMDD (premenstrual dysphoric disorder)    PROZAC 20 MG BUT NONE WITH PREGNANCY  . PONV (postoperative nausea and vomiting)   . Vaginal Pap smear, abnormal     Past Surgical History:  Procedure Laterality Date  . CESAREAN SECTION  2010  . CESAREAN SECTION N/A 03/29/2014   Procedure: REPEAT CESAREAN SECTION;  Surgeon: Florian Buff, MD;  Location: [facility];  Service: Obstetrics;  Laterality: N/A;  . CESAREAN SECTION WITH BILATERAL TUBAL LIGATION Bilateral 05/08/2017   Procedure: CESAREAN SECTION WITH  BILATERAL TUBAL LIGATION;  Surgeon: Jonnie Kind, MD;  Location: [place];  Service: Obstetrics;  Laterality: Bilateral;  . KIDNEY STONE SURGERY  2010   stent  . LEEP  2008  . phylloid removed  2009   left breast  . TONSILLECTOMY    . WISDOM TOOTH EXTRACTION      Family History  Problem Relation Age of Onset  . Diabetes Maternal Grandmother   . Breast cancer Maternal Grandmother        dx 40s-50  . Diabetes Paternal Grandmother   . Breast cancer Paternal Grandmother 63  . COPD Paternal Grandfather   . Heart disease Maternal Grandfather   . Breast cancer Mother 80  . BRCA 1/2 Mother        BRCA1 pos  . Colon polyps Maternal Uncle   . SIDS Maternal Aunt        2 babies died of SIDS at 43 months    Social History Social History   Tobacco Use  . Smoking status: Never Smoker  . Smokeless tobacco: Never Used  Substance Use Topics  . Alcohol use: Yes    Alcohol/week: 0.0 standard drinks    Comment: occ  . Drug use: No    No Known Allergies  Current Outpatient Medications  Medication Sig Dispense Refill  . FLUoxetine (PROZAC) 20 MG tablet Take 1 tablet (20 mg total) by mouth daily. 30 tablet 6  . FLUoxetine (  PROZAC) 40 MG capsule Take 1 capsule (40 mg total) by mouth daily. In addition to the 20mg prozac for a total of 60mg/day 30 capsule 6  . phentermine 37.5 MG capsule Take 1 capsule (37.5 mg total) by mouth every morning. 2 hours before breakfast with 8 ounces water (Patient not taking: Reported on 12/04/2018) 30 capsule 1   No current facility-administered medications for this visit.      Physical Exam  Blood pressure 120/68, pulse 81, height 5' 6" (1.676 m), weight 209 lb (94.8 kg), last menstrual period 12/01/2018, unknown if currently breastfeeding.  Constitutional: overall normal hygiene, normal nutrition, well developed, normal grooming, normal body habitus. Assistive device:none  Musculoskeletal: gait and station Limp none, muscle tone and  strength are normal, no tremors or atrophy is present.  .  Neurological: coordination overall normal.  Deep tendon reflex/nerve stretch intact.  Sensation normal.  Cranial nerves II-XII intact.   Skin:   Normal overall no scars, lesions, ulcers or rashes. No psoriasis.  Psychiatric: Alert and oriented x 3.  Recent memory intact, remote memory unclear.  Normal mood and affect. Well groomed.  Good eye contact.  Cardiovascular: overall no swelling, no varicosities, no edema bilaterally, normal temperatures of the legs and arms, no clubbing, cyanosis and good capillary refill.  Lymphatic: palpation is normal.  Right shoulder has nearly full ROM but pain in the extremes.  NV intact.  All other systems reviewed and are negative   The patient has been educated about the nature of the problem(s) and counseled on treatment options.  The patient appeared to understand what I have discussed and is in agreement with it.  Encounter Diagnosis  Name Primary?  . Chronic right shoulder pain Yes    PLAN Call if any problems.  Precautions discussed.  Continue current medications.   Return to clinic 3 weeks   Begin OT.  Electronically Signed  , MD 8/13/202011:09 AM   

## 2019-01-07 ENCOUNTER — Ambulatory Visit (HOSPITAL_COMMUNITY): Payer: BC Managed Care – PPO | Admitting: Occupational Therapy

## 2019-01-08 ENCOUNTER — Ambulatory Visit: Payer: BC Managed Care – PPO | Admitting: Orthopaedic Surgery

## 2019-01-09 ENCOUNTER — Telehealth (HOSPITAL_COMMUNITY): Payer: Self-pay | Admitting: Occupational Therapy

## 2019-01-09 ENCOUNTER — Telehealth (HOSPITAL_COMMUNITY): Payer: Self-pay

## 2019-01-09 ENCOUNTER — Ambulatory Visit (HOSPITAL_COMMUNITY): Payer: BC Managed Care – PPO | Attending: Occupational Therapy | Admitting: Occupational Therapy

## 2019-01-09 NOTE — Telephone Encounter (Signed)
She was a no show today  and LT said to call pt, L/m  let her know her eval will be on 9/9 and cx all other apptments-r/s after pt comes for eval.

## 2019-01-09 NOTE — Telephone Encounter (Signed)
Interpreter called l/m pt message that we were waiting for him to arrived today at 5:45pm for his apptment. NF 5:31pm

## 2019-01-14 ENCOUNTER — Ambulatory Visit (HOSPITAL_COMMUNITY): Payer: BC Managed Care – PPO | Admitting: Occupational Therapy

## 2019-01-16 ENCOUNTER — Ambulatory Visit (HOSPITAL_COMMUNITY): Payer: BC Managed Care – PPO | Admitting: Occupational Therapy

## 2019-01-19 ENCOUNTER — Encounter (HOSPITAL_COMMUNITY): Payer: BC Managed Care – PPO

## 2019-01-21 ENCOUNTER — Encounter (HOSPITAL_COMMUNITY): Payer: BC Managed Care – PPO

## 2019-01-27 ENCOUNTER — Encounter (HOSPITAL_COMMUNITY): Payer: BC Managed Care – PPO | Admitting: Occupational Therapy

## 2019-01-29 ENCOUNTER — Encounter (HOSPITAL_COMMUNITY): Payer: BC Managed Care – PPO | Admitting: Occupational Therapy

## 2019-02-03 ENCOUNTER — Encounter (HOSPITAL_COMMUNITY): Payer: BC Managed Care – PPO | Admitting: Occupational Therapy

## 2019-02-05 ENCOUNTER — Encounter (HOSPITAL_COMMUNITY): Payer: BC Managed Care – PPO

## 2019-02-13 ENCOUNTER — Other Ambulatory Visit: Payer: Self-pay | Admitting: Emergency Medicine

## 2019-02-13 DIAGNOSIS — Z20822 Contact with and (suspected) exposure to covid-19: Secondary | ICD-10-CM

## 2019-02-14 LAB — NOVEL CORONAVIRUS, NAA: SARS-CoV-2, NAA: NOT DETECTED

## 2019-03-31 ENCOUNTER — Other Ambulatory Visit: Payer: Self-pay | Admitting: *Deleted

## 2019-03-31 DIAGNOSIS — Z20822 Contact with and (suspected) exposure to covid-19: Secondary | ICD-10-CM

## 2019-04-01 LAB — NOVEL CORONAVIRUS, NAA: SARS-CoV-2, NAA: DETECTED — AB

## 2019-04-09 ENCOUNTER — Other Ambulatory Visit: Payer: Self-pay

## 2019-04-09 DIAGNOSIS — Z20822 Contact with and (suspected) exposure to covid-19: Secondary | ICD-10-CM

## 2019-04-12 LAB — NOVEL CORONAVIRUS, NAA: SARS-CoV-2, NAA: DETECTED — AB

## 2019-05-20 DIAGNOSIS — Z8041 Family history of malignant neoplasm of ovary: Secondary | ICD-10-CM | POA: Insufficient documentation

## 2019-06-29 ENCOUNTER — Other Ambulatory Visit: Payer: Self-pay

## 2019-06-29 ENCOUNTER — Encounter: Payer: Self-pay | Admitting: Obstetrics and Gynecology

## 2019-06-29 ENCOUNTER — Ambulatory Visit (INDEPENDENT_AMBULATORY_CARE_PROVIDER_SITE_OTHER): Payer: BC Managed Care – PPO | Admitting: Obstetrics and Gynecology

## 2019-06-29 VITALS — BP 122/77 | HR 94 | Ht 66.0 in | Wt 202.0 lb

## 2019-06-29 DIAGNOSIS — N920 Excessive and frequent menstruation with regular cycle: Secondary | ICD-10-CM | POA: Diagnosis not present

## 2019-06-29 NOTE — Progress Notes (Signed)
Patient ID: Adessa Primiano, female   DOB: 10-21-1985, 34 y.o.   MRN: [id number]    [facility] Visit  '@DATE' @            Patient name: Julia Rangel MRN [id number]  Date of birth: [birth year]  CC & HPI:  Julia Rangel is a 34 y.o. female presenting today with complaint of two-week history of menorrhagia. She reports having a tubal ligation and states that her menstrual periods have been heavy since. She reports having used Mirena, Nuvaring, and the pill over the last few years. She no longer wants to be on any form of birth control and is requesting a hysterectomy. Patient's mother is in her 63's and has had her ovaries removed, but was still diagnosed with primary peritoneal carcinomatosis , BRCA positive.  Mother also has a low grade renal cancer.  The patient also reports a personal history of cervical cancer.  ROS:  ROS  +Menorrhagia  All systems are negative except as noted in the HPI and PMH.    Pertinent History Reviewed:   Reviewed: Significant for tubal ligation. Medical         Past Medical History:  Diagnosis Date  . Anemia   . Dysplasia of cervix   . Family history of BRCA1 gene positive   . Family history of breast cancer   . Gestational diabetes    glyburide  . Kidney stone   . Menorrhagia   . Migraines    since age 64 per patient  . Patient desires pregnancy 06/01/2013  . 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 PMDD (premenstrual dysphoric disorder)    PROZAC 20 MG BUT NONE WITH PREGNANCY  . PONV (postoperative nausea and vomiting)   . Vaginal Pap smear, abnormal                               Surgical Hx:    Past Surgical History:  Procedure Laterality Date  . CESAREAN SECTION  2010  . CESAREAN SECTION N/A 03/29/2014   Procedure: REPEAT CESAREAN SECTION;  Surgeon: Florian Buff, MD;  Location: [facility];  Service: Obstetrics;  Laterality: N/A;  . CESAREAN SECTION WITH BILATERAL TUBAL LIGATION Bilateral 05/08/2017   Procedure: CESAREAN SECTION WITH BILATERAL TUBAL LIGATION;  Surgeon: Jonnie Kind, MD;  Location: [facility];  Service: Obstetrics;  Laterality: Bilateral;  . KIDNEY STONE SURGERY  2010   stent  . LEEP  2008  . phylloid removed  2009   left breast  . TONSILLECTOMY    . WISDOM TOOTH EXTRACTION     Medications: Reviewed & Updated - see associated section                       Current Outpatient Medications:  .  FLUoxetine (PROZAC) 20 MG tablet, Take 1 tablet (20 mg total) by mouth daily., Disp: 30 tablet, Rfl: 6 .  FLUoxetine (PROZAC) 40 MG capsule, Take 1 capsule (40 mg total) by mouth daily. In addition to the 66m prozac for a total of 657mday, Disp: 30 capsule, Rfl: 6 .  Galcanezumab-gnlm (EMGALITY) 120 MG/ML SOAJ, Inject into the skin., Disp: , Rfl:  .  phentermine 37.5 MG capsule, Take 1 capsule (37.5 mg total) by mouth every morning. 2 hours before breakfast with 8 ounces water (Patient taking differently: Take 30 mg by mouth every morning. 2 hours  before breakfast with 8 ounces water), Disp: 30 capsule, Rfl: 1   Social History: Reviewed -  reports that she has never smoked. She has never used smokeless tobacco.  Objective Findings:  Vitals: Blood pressure 122/77, pulse 94, height '5\' 6"'  (1.676 m), weight 202 lb (91.6 kg), last menstrual period 06/17/2019, unknown if currently breastfeeding.  PHYSICAL EXAMINATION General appearance - alert, well appearing, and in no distress and oriented to person, place, and time Mental status - normal mood, behavior, speech, dress, motor activity, and thought processes, affect appropriate to mood  Assessment & Plan:   A:  1. Menorrhagia 2. Desire to avoid Hormone tx. 3. Family history of ovarian cancer/ primary peritoneal carcinomatosis in mother(BRCA1)  4. Personal history of cervical dysplasia and CKC  P:  1.  Discussed endometrial ablation with salpingectomy versus [place] hysterectomy 2. Pt desires to risk reduce as much as  possible  3. F/u 1 month to allow further chart review  Patient's mother's information: Tommiann Hill  Dr. Alvy Bimler DOB: 03/30/59    By signing my name below, I, Clerance Lav, attest that this documentation has been prepared under the direction and in the presence of Jonnie Kind, MD. Electronically Signed: [place]. 06/29/19. 5:06 PM.  I personally performed the services described in this documentation, which was SCRIBED in my presence. The recorded information has been reviewed and considered accurate. It has been edited as necessary during review. Jonnie Kind, MD

## 2019-07-05 ENCOUNTER — Ambulatory Visit: Payer: BC Managed Care – PPO | Attending: Internal Medicine

## 2019-07-05 DIAGNOSIS — Z23 Encounter for immunization: Secondary | ICD-10-CM | POA: Insufficient documentation

## 2019-07-05 NOTE — Progress Notes (Signed)
   Covid-19 Vaccination Clinic  Name:  Julia Rangel    MRN: [id number] DOB: Jul 10, 1985  07/05/2019  [name] was observed post Covid-19 immunization for 15 minutes without incidence. She was provided with Vaccine Information Sheet and instruction to access the V-Safe system.   [name] was instructed to call 911 with any severe reactions post vaccine: Marland Kitchen Difficulty breathing  . Swelling of your face and throat  . A fast heartbeat  . A bad rash all over your body  . Dizziness and weakness    Immunizations Administered    Name Date Dose VIS Date Route   Moderna COVID-19 Vaccine 07/05/2019  2:49 PM 0.5 mL 04/07/2019 Intramuscular   Manufacturer: Moderna   Lot: [id number]   AngieVO:[id number]

## 2019-07-10 ENCOUNTER — Other Ambulatory Visit: Payer: Self-pay | Admitting: Advanced Practice Midwife

## 2019-07-13 ENCOUNTER — Telehealth: Payer: Self-pay | Admitting: Obstetrics and Gynecology

## 2019-07-13 ENCOUNTER — Other Ambulatory Visit: Payer: Self-pay | Admitting: Advanced Practice Midwife

## 2019-07-13 MED ORDER — FLUOXETINE HCL 40 MG PO CAPS: 40.0000 mg | ORAL_CAPSULE | Freq: Every day | ORAL | 6 refills | Status: DC

## 2019-07-13 NOTE — Telephone Encounter (Signed)
Phone call to pt re: MyChart questions. Julia Rangel is trying to decide her preferred type of surgery to handle her heavy menstrual flow.   1.  Patient's mother is BRCA1 positive and has had primary peritoneal carcinomatosis occurring after hysterectomy with ovary and tube removal which causes patient concern.  The patient has been personally tested for BRCA1 and is BRCA1 negative.  2.  Patient has had endometrial ablation and bilateral salpingectomy explained and recalls that discussion  3.  Patient had a history of conization of the cervix after a LEEP, at age 67 both performed in [place].  Records are currently unavailable.  Review of my chart does not identify the chart records.  Julia Rangel will come to the office and sign a release of information request with the name of the hospital. It is unusual to have severe disease at such young age, and I have made her aware that the indications for conization were more liberal 15 years ago then today.  Therefore leaving the cervix may be an option if we can confirm that she only had a mild dysplasia when she had a conization, and margins were clear Pap smears in epic showed normal Paps in 2014 2016 and 2017.  SHE is due a Pap.  The impact of cervix removal on vaginal moisture is discussed.  4.  Patient will reviewed the information we have discussed, sign a release of records request at our office, and respond to her MyChart review of this note as we decide on her desired treatment

## 2019-07-22 ENCOUNTER — Telehealth: Payer: Self-pay | Admitting: Obstetrics and Gynecology

## 2019-07-22 NOTE — Telephone Encounter (Signed)
Left message, pt to work with Celso Amy re: surgery. Pt is requesting abdominal supracervical hysterectomy  Bilateral salpingectomy for heavy mensess. Pt knows that insurance may require review  Peer to peer review for hysterectomy. Despite BrCA 1 present for mother and disseminatd carcinomatosis,despite TAH SO  , and NOT for patient. g

## 2019-07-28 ENCOUNTER — Encounter: Payer: Self-pay | Admitting: Obstetrics and Gynecology

## 2019-07-28 ENCOUNTER — Other Ambulatory Visit: Payer: Self-pay

## 2019-07-28 ENCOUNTER — Ambulatory Visit (INDEPENDENT_AMBULATORY_CARE_PROVIDER_SITE_OTHER): Payer: BC Managed Care – PPO | Admitting: Obstetrics and Gynecology

## 2019-07-28 ENCOUNTER — Other Ambulatory Visit (HOSPITAL_COMMUNITY)
Admission: RE | Admit: 2019-07-28 | Discharge: 2019-07-28 | Disposition: A | Discharge status: Home / Self Care | Payer: BC Managed Care – PPO | Source: Ambulatory Visit | Attending: Obstetrics and Gynecology | Admitting: Obstetrics and Gynecology

## 2019-07-28 VITALS — BP 109/77 | HR 89 | Ht 66.0 in | Wt 207.0 lb

## 2019-07-28 DIAGNOSIS — Z01811 Encounter for preprocedural respiratory examination: Secondary | ICD-10-CM

## 2019-07-28 DIAGNOSIS — Z01818 Encounter for other preprocedural examination: Secondary | ICD-10-CM

## 2019-07-28 DIAGNOSIS — Z1151 Encounter for screening for human papillomavirus (HPV): Secondary | ICD-10-CM | POA: Diagnosis not present

## 2019-07-28 NOTE — Progress Notes (Signed)
Chief Complaint  Patient presents with   Pre-op Exam    Subjective:     VHL, WRN and WT1.   At this time, I would recommend a hysterectomy where we will leave the cervix and ovaries.   Gynecologic History Patient's last menstrual period was 07/14/2019 (approximate). Contraception: condoms Last Pap: 07/28/2019. Results were: Pending Last mammogram: None. Results were:   Obstetric History OB History  Gravida Para Term Preterm AB Living  _0 SAB TAB Ectopic Multiple Live Births        0 3    # Outcome Date GA Lbr Len/2nd Weight Sex Delivery Anes PTL Lv  3 Term 05/08/17 [redacted]w[redacted]d 8 lb 14 oz (4.025 kg) M CS-LTranv Spinal  LIV  2 Term 03/29/14 341w0d8 lb 1.4 oz (3.668 kg) F CS-LTranv Spinal N LIV     Birth Comments: No problems at birth   1 Term 11/07/08 4130w0d lb 3 oz (3.714 kg) F CS-LTranv EPI N LIV     Birth Comments: for FTP @ 9cm     Complications: Failure to Progress in First Stage      Review of Systems  Review of Systems  Constitutional: Negative for fever, chills, weight loss, malaise/fatigue and diaphoresis.  HENT: Negative for hearing loss, ear pain, nosebleeds, congestion, sore throat, neck pain, tinnitus and ear discharge.   Eyes: Negative for blurred vision, double vision, photophobia, pain, discharge and redness.  Respiratory: Negative for cough, hemoptysis, sputum production, shortness of breath, wheezing and stridor.   Cardiovascular: Negative for chest pain, palpitations, orthopnea, claudication, leg swelling and PND.  Gastrointestinal: negative for abdominal pain. Negative for heartburn, nausea, vomiting, diarrhea, constipation, blood in stool and melena.  Genitourinary: Negative for dysuria, urgency, frequency, hematuria and flank pain.  Musculoskeletal: Negative for myalgias, back pain, joint pain and falls.  Skin: Negative for itching and rash.  Neurological: Negative for dizziness, tingling, tremors, sensory change, speech change, focal  weakness, seizures, loss of consciousness, weakness and headaches.  Endo/Heme/Allergies: Negative for environmental allergies and polydipsia. Does not bruise/bleed easily.  Psychiatric/Behavioral: Negative for depression, suicidal ideas, hallucinations, memory loss and substance abuse. The patient is not nervous/anxious and does not have insomnia.        Objective:    Physical Exam  Vitals reviewed. Constitutional: She is oriented to person, place, and time. She appears well-developed and well-nourished.  HENT:  Head: Normocephalic and atraumatic.        Right Ear: External ear normal.  Left Ear: External ear normal.  Nose: Nose normal.  Mouth/Throat: Oropharynx is clear and moist.  Eyes: Conjunctivae and EOM are normal. Pupils are equal, round, and reactive to light. Right eye exhibits no discharge. Left eye exhibits no discharge. No scleral icterus.  Neck: Normal range of motion. Neck supple. No tracheal deviation present. No thyromegaly present.  Cardiovascular: Normal rate, regular rhythm, normal heart sounds and intact distal pulses.  Exam reveals no gallop and no friction rub.   No murmur heard. Respiratory: Effort normal and breath sounds normal. No respiratory distress. She has no wheezes. She has no rales. She exhibits no tenderness.  GI: Soft. Bowel sounds are normal. She exhibits no distension and no mass. There is no tenderness. There is no rebound and no guarding.  Genitourinary:       Vulva is normal without lesions Vagina is pink moist without discharge Cervix normal in appearance and pap is done Uterus is normal size  shape and contour Adnexa is negative with normal sized ovaries by sonogram  Musculoskeletal: Normal range of motion. She exhibits no edema and no tenderness.  Neurological: She is alert and oriented to person, place, and time. She has normal reflexes. She displays normal reflexes. No cranial nerve deficit. She exhibits normal muscle tone. Coordination  normal.  Skin: Skin is warm and dry. No rash noted. No erythema. No pallor.  Psychiatric: She has a normal mood and affect. Her behavior is normal. Judgment and thought content normal.       Assessment:    Healthy female exam.    Plan:    Will proceed toward hysterectomy, after results of pap,       This chart was scribed by Jacqualyn Posey, Medical Scribe, for Dr. Mallory Shirk on 07/28/19 at 4:57 PM. This chart was reviewed by Dr. Mallory Shirk for accuracy.  Preoperative History and Physical  Julia Rangel is a 34 y.o. [id number] here for surgical management of persistent heavy vaginal bleeding.  Gynecologic history is significant.  The patient is status post conization of the cervix at age 3 for abnormal Paps performed in [place].  The results have not been able to be found.  She has had normal Paps since that age.  With documentation showing normal Paps in 2014 2016 and 2017.  Pap is repeated today Ms. Besecker strongly desires hysterectomy and removal of tubes has resolution of her bleeding persistence and attempting to minimize her risks.  Family history significantly influences her thinking.  Her mother has had breast cancer as well as peritoneal carcinomatosis.  The patient's mother had had hysterectomy performed with removal of tubes and ovaries to try to reduce her risk and still got disseminated peritoneal carcinomatosis  her mother is both BRCA1 and BRCA2 positive.  The patient has been  personally tested and is negative for BRCA1 and BRCA2    I have informed her that her risk of ovarian cancer is therefore in the normal range the patient is aware that removal of fallopian tubes would reduce her ovarian and tubal cancer risk by 50% and she desires tubal removal.  She has been given extensive counseling over endometrial ablation as a way of attempting to control the menses.  She rejects this option due to awareness of several female associates who have had endometrial ablations with  failure., The patient has been offered IUD as an attempt to manage her menses and provide contraception and she rejects this option she has had 3 prior cesarean sections, 2010 2015 2019   Zarra Geffert is a 34 y.o. female here for a preoperative evaluation.  Current complaints: vaginal bleeding.  Personal health questionnaire reviewed: yes.  At this time, she is still experiencing significant vaginal bleeding. At it's worse, she bled through both a pad and a tampon and had it bleed down her leg into her shoes.  She has had a hemoglobin drop as low as 8.6 in 2019.  Most recently hemoglobin was 11.3 in January 2019 Proposed surgery: Abdominal supracervical hysterectomy bilateral salpingectomy  Past Medical History:  Diagnosis Date   Anemia    Dysplasia of cervix    Family history of BRCA1 gene positive    Family history of breast cancer    Gestational diabetes    glyburide   Kidney stone    Menorrhagia    Migraines    since age 56 per patient   Patient desires pregnancy 06/01/2013   Personal history of malignant phylloides tumor of breast  left breast   PMDD (premenstrual dysphoric disorder)    PROZAC 20 MG BUT NONE WITH PREGNANCY   PONV (postoperative nausea and vomiting)    Vaginal Pap smear, abnormal    Past Surgical History:  Procedure Laterality Date   CESAREAN SECTION  2010   CESAREAN SECTION N/A 03/29/2014   Procedure: REPEAT CESAREAN SECTION;  Surgeon: Florian Buff, MD;  Location: [facility];  Service: Obstetrics;  Laterality: N/A;   CESAREAN SECTION WITH BILATERAL TUBAL LIGATION Bilateral 05/08/2017   Procedure: CESAREAN SECTION WITH BILATERAL TUBAL LIGATION;  Surgeon: Jonnie Kind, MD;  Location: [facility];  Service: Obstetrics;  Laterality: Bilateral;   KIDNEY STONE SURGERY  2010   stent   LEEP  2008   phylloid removed  2009   left breast   TONSILLECTOMY     WISDOM TOOTH EXTRACTION     OB History  Gravida Para Term Preterm AB Living  _0 SAB TAB  Ectopic Multiple Live Births        0 3    # Outcome Date GA Lbr Len/2nd Weight Sex Delivery Anes PTL Lv  3 Term 05/08/17 [redacted]w[redacted]d 8 lb 14 oz (4.025 kg) M CS-LTranv Spinal  LIV  2 Term 03/29/14 348w0d8 lb 1.4 oz (3.668 kg) F CS-LTranv Spinal N LIV     Birth Comments: No problems at birth   1 Term 11/07/08 4153w0d lb 3 oz (3.714 kg) F CS-LTranv EPI N LIV     Birth Comments: for FTP @ 9cm     Complications: Failure to Progress in First Stage  Patient denies any other pertinent gynecologic issues.   Current Outpatient Medications on File Prior to Visit  Medication Sig Dispense Refill   FLUoxetine (PROZAC) 20 MG tablet Take 1 tablet by mouth once daily 30 tablet 6   FLUoxetine (PROZAC) 40 MG capsule Take 1 capsule (40 mg total) by mouth daily. 30 capsule 6   Galcanezumab-gnlm (EMGALITY) 120 MG/ML SOAJ Inject into the skin.     phentermine 37.5 MG capsule Take 1 capsule (37.5 mg total) by mouth every morning. 2 hours before breakfast with 8 ounces water (Patient taking differently: Take 30 mg by mouth every morning. 2 hours before breakfast with 8 ounces water) 30 capsule 1   No current facility-administered medications on file prior to visit.   No Known Allergies  Social History:   reports that she has never smoked. She has never used smokeless tobacco. She reports current alcohol use. She reports that she does not use drugs.  Family History  Problem Relation Age of Onset   Diabetes Maternal Grandmother    Breast cancer Maternal Grandmother        dx 40s-50   Diabetes Paternal Grandmother    Breast cancer Paternal Grandmother 69 59COPD Paternal Grandfather    Heart disease Maternal Grandfather    Breast cancer Mother 58 110BRCA 1/2 Mother        BRCA1 pos   Ovarian cancer Mother    Other Mother        peritoneal cancer   Colon polyps Maternal Uncle    SIDS Maternal Aunt        2 babies died of SIDS at 4 m69 months Review of Systems: Noncontributory  PHYSICAL EXAM: Blood  pressure 109/77, pulse 89, height 5' 6" (1.676 m), weight 207 lb (93.9 kg), last menstrual period 07/14/2019, not  currently breastfeeding. General appearance - alert, well appearing, and in no distress Chest - clear to auscultation, no wheezes, rales or rhonchi, symmetric air entry Heart - normal rate and regular rhythm Abdomen - soft, nontender, nondistended, no masses or organomegaly                     Well-healed cesarean section scars transverse lower abdominal Pelvic - examination external genitalia normal female             Vaginal exam well supported Cervix small well supported GC and Chlamydia obtained as well as Pap Uterus anterior 8-week size Adnexa negative for masses Extremities - peripheral pulses normal, no pedal edema, no clubbing or cyanosis  Labs: Genetic testing as listed below Genetic testing on 12/13/2018 through the [facility] Multi-Gene Panel revealed no deleterious mutations in: AIP, ALK, APC, ATM, AXIN2,BAP1,  BARD1, BLM, BMPR1A, BRCA1, BRCA2, BRIP1, CASR, CDC73, CDH1, CDK4, CDKN1B, CDKN1C, CDKN2A (p14ARF), CDKN2A (p16INK4a), CEBPA, CHEK2, CTNNA1, DICER1, DIS3L2, EGFR (c.2369C>T, p.Thr790Met variant only), EPCAM (Deletion/duplication testing only), FH, FLCN, GATA2, GPC3, GREM1 (Promoter region deletion/duplication testing only), HOXB13 (c.251G>A, p.Gly84Glu), HRAS, KIT, MAX, MEN1, MET, MITF (c.952G>A, p.Glu318Lys variant only), MLH1, MSH2, MSH3, MSH6, MUTYH, NBN, NF1, NF2, NTHL1, PALB2, PDGFRA, PHOX2B, PMS2, POLD1, POLE, POT1, PRKAR1A, PTCH1, PTEN, RAD50, RAD51C, RAD51D, RB1, RECQL4, RET, RUNX1, SDHAF2, SDHA (sequence changes only), SDHB, SDHC, SDHD, SMAD4, SMARCA4, SMARCB1, SMARCE1, STK11, SUFU, TERC, TERT, TMEM127, TP53, TSC1, TSC2,   Imaging Studies: No results found.  Assessment: Patient Active Problem List   Diagnosis Date Noted   Genetic testing 01/02/2018   Family history of breast cancer    Family history of BRCA1 gene positive    Delivery with history of  cesarean section 05/08/2017   Ovarian cyst 11/08/2016   History of cesarean delivery 10/09/2016   Migraine 02/02/2015   Obesity (BMI 30.0-34.9) 02/02/2015   Personal history of malignant phylloides tumor of breast 09/19/2012   Anemia 09/19/2012   Menorrhagia 09/19/2012   PMDD (premenstrual dysphoric disorder) 09/19/2012   Dysplasia of cervix 09/19/2012    Plan: We will present her desire for hysterectomy to insurance for review once Pap smear results are confirmed Given the young age of her conization, at a time where indications for conization were much more liberal than today, I have discussed the pros and cons of cervix preservation with her.  She desires to avoid the vaginal dryness associated with removal of the cervix.  She is aware that continued Pap smears on an annual basis are recommended if the cervix is in place after a conization Patient will undergo surgical management with abdominal supracervical hysterectomy.    Jonnie Kind, MD  07/29/2019 9:57 PM

## 2019-07-30 ENCOUNTER — Encounter: Payer: Self-pay | Admitting: Obstetrics and Gynecology

## 2019-08-03 LAB — CYTOLOGY - PAP
Chlamydia: NEGATIVE
Comment: NEGATIVE
Comment: NEGATIVE
Comment: NEGATIVE
Comment: NORMAL
Diagnosis: NEGATIVE
HPV 16: NEGATIVE
HPV 18 / 45: NEGATIVE
High risk HPV: POSITIVE — AB
Neisseria Gonorrhea: NEGATIVE

## 2019-08-08 ENCOUNTER — Ambulatory Visit: Payer: BC Managed Care – PPO | Attending: Internal Medicine

## 2019-08-08 DIAGNOSIS — Z23 Encounter for immunization: Secondary | ICD-10-CM

## 2019-08-08 NOTE — Progress Notes (Signed)
   Covid-19 Vaccination Clinic  Name:  Julia Rangel    MRN: [id number] DOB: 03-Oct-1985  08/08/2019  Julia Rangel was observed post Covid-19 immunization for 15 minutes without incident. She was provided with Vaccine Information Sheet and instruction to access the V-Safe system.   Julia Rangel was instructed to call 911 with any severe reactions post vaccine: Marland Kitchen Difficulty breathing  . Swelling of face and throat  . A fast heartbeat  . A bad rash all over body  . Dizziness and weakness   Immunizations Administered    Name Date Dose VIS Date Route   Moderna COVID-19 Vaccine 08/08/2019 10:38 AM 0.5 mL 04/07/2019 Intramuscular   Manufacturer: Moderna   Lot: [id number]   [name]

## 2019-08-24 ENCOUNTER — Encounter: Payer: Self-pay | Admitting: *Deleted

## 2019-09-03 ENCOUNTER — Telehealth: Payer: Self-pay | Admitting: Obstetrics and Gynecology

## 2019-09-03 DIAGNOSIS — D069 Carcinoma in situ of cervix, unspecified: Secondary | ICD-10-CM

## 2019-09-03 NOTE — Telephone Encounter (Signed)
Pt informed Julia Rangel that Conization (LEEP) was done in 2008 in [place] for severe dysplasia.  PAPS  Were normal in  2014, 2016, 2017 before HPV + testing in 07/28/2019. Pt agrees to cytology next week.  Will do colposcopy at same time to insure no missed lesions.  Tenatively planned for 9 am Tuesday

## 2019-09-05 DIAGNOSIS — D649 Anemia, unspecified: Secondary | ICD-10-CM

## 2019-09-05 HISTORY — DX: Anemia, unspecified: D64.9

## 2019-09-08 ENCOUNTER — Ambulatory Visit (INDEPENDENT_AMBULATORY_CARE_PROVIDER_SITE_OTHER): Payer: BC Managed Care – PPO | Admitting: Obstetrics and Gynecology

## 2019-09-08 ENCOUNTER — Other Ambulatory Visit: Payer: Self-pay | Admitting: Obstetrics and Gynecology

## 2019-09-08 ENCOUNTER — Encounter: Payer: Self-pay | Admitting: Obstetrics and Gynecology

## 2019-09-08 ENCOUNTER — Other Ambulatory Visit: Payer: Self-pay

## 2019-09-08 VITALS — BP 117/81 | HR 79 | Wt 203.0 lb

## 2019-09-08 DIAGNOSIS — D069 Carcinoma in situ of cervix, unspecified: Secondary | ICD-10-CM | POA: Diagnosis not present

## 2019-09-08 NOTE — Addendum Note (Signed)
Addended by: Jonnie Kind on: 09/08/2019 02:31 PM   Modules accepted: Level of Service

## 2019-09-08 NOTE — Progress Notes (Signed)
  Julia Rangel 34 y.o. [id number] here for colposcopy for history of LEEP with severe dysplasia.  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 W.  Discussed role for HPV in cervical dysplasia, need for surveillance. With the history of severe dysplasia, and now recurrent HPV, discussion is whether to remove cervix or preserve at time of hysterectomy.    Patient given informed consent, signed copy in the chart, time out was performed.  Placed in lithotomy position. Cervix viewed with speculum and colposcope after application of acetic acid.  Assistant: Alice Rieger RN Colposcopy adequate? Yes  no visible lesions; biopsies obtained at n/a.   ECC specimen obtained.yes. Specimen was labelled and sent to pathology.   Colposcopy IMPRESSION: no visible dysplasia,  History of LEEP with pathology noting severe dysplasia.  Patient was given post procedure instructions. Will follow up pathology and manage accordingly.  Routine preventative health maintenance measures emphasized.  Will follow up pathology, if normal, patient does not wish for continued annual monitoring of pap, so desires removal of cervix at time of hysterectomy.  Additionally, discussed her abdominal incision and the tatoo above it, on the right side just above old cesarean scar.  She desires that the incision be wide enough to symmetrically remove the scar from cesareans and the tatoo.  The line that would be needed to achieve this is discussed and marked on the skin for her review.

## 2019-09-09 NOTE — Addendum Note (Signed)
Addended by: Octaviano Glow on: 09/09/2019 08:24 AM   Modules accepted: Orders

## 2019-09-11 ENCOUNTER — Encounter (HOSPITAL_COMMUNITY)
Admission: RE | Admit: 2019-09-11 | Discharge: 2019-09-11 | Disposition: A | Discharge status: Home / Self Care | Payer: BC Managed Care – PPO | Source: Ambulatory Visit | Attending: Obstetrics and Gynecology | Admitting: Obstetrics and Gynecology

## 2019-09-11 ENCOUNTER — Other Ambulatory Visit (HOSPITAL_COMMUNITY)
Admission: RE | Admit: 2019-09-11 | Discharge: 2019-09-11 | Disposition: A | Discharge status: Home / Self Care | Payer: BC Managed Care – PPO | Source: Ambulatory Visit | Attending: Obstetrics and Gynecology | Admitting: Obstetrics and Gynecology

## 2019-09-11 ENCOUNTER — Other Ambulatory Visit: Payer: Self-pay | Admitting: Obstetrics and Gynecology

## 2019-09-11 ENCOUNTER — Other Ambulatory Visit: Payer: Self-pay

## 2019-09-11 ENCOUNTER — Encounter (HOSPITAL_COMMUNITY): Payer: Self-pay

## 2019-09-11 DIAGNOSIS — Z20822 Contact with and (suspected) exposure to covid-19: Secondary | ICD-10-CM | POA: Diagnosis not present

## 2019-09-11 DIAGNOSIS — Z01812 Encounter for preprocedural laboratory examination: Secondary | ICD-10-CM | POA: Insufficient documentation

## 2019-09-11 LAB — COMPREHENSIVE METABOLIC PANEL
ALT: 18 U/L (ref 0–44)
AST: 18 U/L (ref 15–41)
Albumin: 3.9 g/dL (ref 3.5–5.0)
Alkaline Phosphatase: 70 U/L (ref 38–126)
Anion gap: 7 (ref 5–15)
BUN: 15 mg/dL (ref 6–20)
CO2: 24 mmol/L (ref 22–32)
Calcium: 8.8 mg/dL — ABNORMAL LOW (ref 8.9–10.3)
Chloride: 105 mmol/L (ref 98–111)
Creatinine, Ser: 0.83 mg/dL (ref 0.44–1.00)
GFR calc Af Amer: >60 mL/min (ref >60)
GFR calc non Af Amer: >60 mL/min (ref >60)
Glucose, Bld: 71 mg/dL (ref 70–99)
Potassium: 3.8 mmol/L (ref 3.5–5.1)
Sodium: 136 mmol/L (ref 135–145)
Total Bilirubin: 0.3 mg/dL (ref 0.3–1.2)
Total Protein: 7.5 g/dL (ref 6.5–8.1)

## 2019-09-11 LAB — URINALYSIS, ROUTINE W REFLEX MICROSCOPIC
Bilirubin Urine: NEGATIVE
Glucose, UA: NEGATIVE mg/dL
Ketones, ur: NEGATIVE mg/dL
Leukocytes,Ua: NEGATIVE
Nitrite: NEGATIVE
Protein, ur: NEGATIVE mg/dL
Specific Gravity, Urine: 1.02 (ref 1.005–1.030)
pH: 6 (ref 5.0–8.0)

## 2019-09-11 LAB — CBC
HCT: 31.3 % — ABNORMAL LOW (ref 36.0–46.0)
Hemoglobin: 9.7 g/dL — ABNORMAL LOW (ref 12.0–15.0)
MCH: 26.6 pg (ref 26.0–34.0)
MCHC: 31 g/dL (ref 30.0–36.0)
MCV: 85.8 fL (ref 80.0–100.0)
Platelets: 349 10*3/uL (ref 150–400)
RBC: 3.65 MIL/uL — ABNORMAL LOW (ref 3.87–5.11)
RDW: 14.4 % (ref 11.5–15.5)
WBC: 5 10*3/uL (ref 4.0–10.5)
nRBC: 0 % (ref 0.0–0.2)

## 2019-09-11 LAB — HCG, SERUM, QUALITATIVE: Preg, Serum: NEGATIVE

## 2019-09-11 NOTE — Patient Instructions (Signed)
Julia Rangel  09/11/2019     @PREFPERIOPPHARMACY @   Your procedure is scheduled on Tuesday, May 11.  Report to [facility] at 615 A.M.  Call this number if you have problems the morning of surgery:  [phone]   Remember:  Do not eat or drink after midnight.      Take these medicines the morning of surgery with A SIP OF WATER     Do not wear jewelry, make-up or nail polish.  Do not wear lotions, powders, or perfumes, or deodorant.  Do not shave 48 hours prior to surgery.  Men may shave face and neck.  Do not bring valuables to the hospital.  [facility] is not responsible for any belongings or valuables.  Contacts, dentures or bridgework may not be worn into surgery.  Leave your suitcase in the car.  After surgery it may be brought to your room.  For patients admitted to the hospital, discharge time will be determined by your treatment team.  Patients discharged the day of surgery will not be allowed to drive home.   Name and phone number of your driver:   To be admitted Special instructions:  Follow bowel prep instructions included from Dr. Glo Herring  Please read over the following fact sheets that you were given. Coughing and Deep Breathing, Surgical Site Infection Prevention, Anesthesia Post-op Instructions and Care and Recovery After Surgery      General Anesthesia, Adult General anesthesia is the use of medicines to make a person "go to sleep" (unconscious) for a medical procedure. General anesthesia must be used for certain procedures, and is often recommended for procedures that:  Last a long time.  Require you to be still or in an unusual position.  Are major and can cause blood loss. The medicines used for general anesthesia are called general anesthetics. As well as making you unconscious for a certain amount of time, these medicines:  Prevent pain.  Control your blood pressure.  Relax your muscles. Tell a health care provider about:  Any allergies  you have.  All medicines you are taking, including vitamins, herbs, eye drops, creams, and over-the-counter medicines.  Any problems you or family members have had with anesthetic medicines.  Types of anesthetics you have had in the past.  Any blood disorders you have.  Any surgeries you have had.  Any medical conditions you have.  Any recent upper respiratory, chest, or ear infections.  Any history of: ? Heart or lung conditions, such as heart failure, sleep apnea, asthma, or chronic obstructive pulmonary disease (COPD). ? Armed forces logistics/support/administrative officer. ? Depression or anxiety.  Any tobacco or drug use, including marijuana or alcohol use.  Whether you are pregnant or may be pregnant. What are the risks? Generally, this is a safe procedure. However, problems may occur, including:  Allergic reaction.  Lung and heart problems.  Inhaling food or liquid from the stomach into the lungs (aspiration).  Nerve injury.  Dental injury.  Air in the bloodstream, which can lead to stroke.  Extreme agitation or confusion (delirium) when you wake up from the anesthetic.  Waking up during your procedure and being unable to move. This is rare. These problems are more likely to develop if you are having a major surgery or if you have an advanced or serious medical condition. You can prevent some of these complications by answering all of your health care provider's questions thoroughly and by following all instructions before your procedure. General anesthesia can cause side effects,  including:  Nausea or vomiting.  A sore throat from the breathing tube.  Hoarseness.  Wheezing or coughing.  Shaking chills.  Tiredness.  Body aches.  Anxiety.  Sleepiness or drowsiness.  Confusion or agitation. What happens before the procedure? Staying hydrated Follow instructions from your health care provider about hydration, which may include:  Up to 2 hours before the procedure - you may  continue to drink clear liquids, such as water, clear fruit juice, black coffee, and plain tea.  Eating and drinking restrictions Follow instructions from your health care provider about eating and drinking, which may include:  8 hours before the procedure - stop eating heavy meals or foods such as meat, fried foods, or fatty foods.  6 hours before the procedure - stop eating light meals or foods, such as toast or cereal.  6 hours before the procedure - stop drinking milk or drinks that contain milk.  2 hours before the procedure - stop drinking clear liquids. Medicines Ask your health care provider about:  Changing or stopping your regular medicines. This is especially important if you are taking diabetes medicines or blood thinners.  Taking medicines such as aspirin and ibuprofen. These medicines can thin your blood. Do not take these medicines unless your health care provider tells you to take them.  Taking over-the-counter medicines, vitamins, herbs, and supplements. Do not take these during the week before your procedure unless your health care provider approves them. General instructions  Starting 3-6 weeks before the procedure, do not use any products that contain nicotine or tobacco, such as cigarettes and e-cigarettes. If you need help quitting, ask your health care provider.  If you brush your teeth on the morning of the procedure, make sure to spit out all of the toothpaste.  Tell your health care provider if you become ill or develop a cold, cough, or fever.  If instructed by your health care provider, bring your sleep apnea device with you on the day of your surgery (if applicable).  Ask your health care provider if you will be going home the same day, the following day, or after a longer hospital stay. ? Plan to have someone take you home from the hospital or clinic. ? Plan to have a responsible adult care for you for at least 24 hours after you leave the hospital or  clinic. This is important. What happens during the procedure?   You will be given anesthetics through both of the following: ? A mask placed over your nose and mouth. ? An IV in one of your veins.  You may receive a medicine to help you relax (sedative).  After you are unconscious, a breathing tube may be inserted down your throat to help you breathe. This will be removed before you wake up.  An anesthesia specialist will stay with you throughout your procedure. He or she will: ? Keep you comfortable and safe by continuing to give you medicines and adjusting the amount of medicine that you get. ? Monitor your blood pressure, pulse, and oxygen levels to make sure that the anesthetics do not cause any problems. The procedure may vary among health care providers and hospitals. What happens after the procedure?  Your blood pressure, temperature, heart rate, breathing rate, and blood oxygen level will be monitored until the medicines you were given have worn off.  You will wake up in a recovery area. You may wake up slowly.  If you feel anxious or agitated, you may be given medicine  to help you calm down.  If you will be going home the same day, your health care provider may check to make sure you can walk, drink, and urinate.  Your health care provider will treat any pain or side effects you have before you go home.  Do not drive for 24 hours if you were given a sedative. Summary  General anesthesia is used to keep you still and prevent pain during a procedure.  It is important to tell your health care provider about your medical history and any surgeries you have had, and previous experience with anesthesia.  Follow your health care provider's instructions about when to stop eating, drinking, or taking certain medicines before your procedure.  Plan to have someone take you home from the hospital or clinic. This information is not intended to replace advice given to you by your  health care provider. Make sure you discuss any questions you have with your health care provider. Document Revised: 09/10/2017 Document Reviewed: 12/07/2016 Elsevier Patient Education  [place] Anesthesia, Adult, Care After This sheet gives you information about how to care for yourself after your procedure. Your health care provider may also give you more specific instructions. If you have problems or questions, contact your health care provider. What can I expect after the procedure? After the procedure, the following side effects are common:  Pain or discomfort at the IV site.  Nausea.  Vomiting.  Sore throat.  Trouble concentrating.  Feeling cold or chills.  Weak or tired.  Sleepiness and fatigue.  Soreness and body aches. These side effects can affect parts of the body that were not involved in surgery. Follow these instructions at home:  For at least 24 hours after the procedure:  Have a responsible adult stay with you. It is important to have someone help care for you until you are awake and alert.  Rest as needed.  Do not: ? Participate in activities in which you could fall or become injured. ? Drive. ? Use heavy machinery. ? Drink alcohol. ? Take sleeping pills or medicines that cause drowsiness. ? Make important decisions or sign legal documents. ? Take care of children on your own. Eating and drinking  Follow any instructions from your health care provider about eating or drinking restrictions.  When you feel hungry, start by eating small amounts of foods that are soft and easy to digest (bland), such as toast. Gradually return to your regular diet.  Drink enough fluid to keep your urine pale yellow.  If you vomit, rehydrate by drinking water, juice, or clear broth. General instructions  If you have sleep apnea, surgery and certain medicines can increase your risk for breathing problems. Follow instructions from your health care  provider about wearing your sleep device: ? Anytime you are sleeping, including during daytime naps. ? While taking prescription pain medicines, sleeping medicines, or medicines that make you drowsy.  Return to your normal activities as told by your health care provider. Ask your health care provider what activities are safe for you.  Take over-the-counter and prescription medicines only as told by your health care provider.  If you smoke, do not smoke without supervision.  Keep all follow-up visits as told by your health care provider. This is important. Contact a health care provider if:  You have nausea or vomiting that does not get better with medicine.  You cannot eat or drink without vomiting.  You have pain that does not get better with medicine.  You are unable to pass urine.  You develop a skin rash.  You have a fever.  You have redness around your IV site that gets worse. Get help right away if:  You have difficulty breathing.  You have chest pain.  You have blood in your urine or stool, or you vomit blood. Summary  After the procedure, it is common to have a sore throat or nausea. It is also common to feel tired.  Have a responsible adult stay with you for the first 24 hours after general anesthesia. It is important to have someone help care for you until you are awake and alert.  When you feel hungry, start by eating small amounts of foods that are soft and easy to digest (bland), such as toast. Gradually return to your regular diet.  Drink enough fluid to keep your urine pale yellow.  Return to your normal activities as told by your health care provider. Ask your health care provider what activities are safe for you. This information is not intended to replace advice given to you by your health care provider. Make sure you discuss any questions you have with your health care provider. Document Revised: 04/26/2017 Document Reviewed: 12/07/2016 Elsevier  Patient Education  [place]. Hysterectomy Information  A hysterectomy is a surgery in which the uterus is removed. The fallopian tubes and ovaries may be removed (bilateral salpingo-oophorectomy) as well. This procedure may be done to treat various medical problems. After the procedure, a woman will no longer have menstrual periods nor will she be able to become pregnant (sterile). What are the reasons for a hysterectomy? There are many reasons why a woman might have this procedure. They include:  Persistent, abnormal vaginal bleeding.  Long-term (chronic) pelvic pain or infection.  Endometriosis. This is when the lining of the uterus (endometrium) starts to grow outside the uterus.  Adenomyosis. This is when the endometrium starts to grow in the muscle of the uterus.  Pelvic organ prolapse. This is a condition in which the uterus falls down into the vagina.  Noncancerous growths in the uterus (uterine fibroids) that cause symptoms.  The presence of precancerous cells.  Cervical or uterine cancer. What are the different types of hysterectomy? There are three different types of hysterectomy:  Supracervical hysterectomy. In this type, the top part of the uterus is removed, but not the cervix.  Total hysterectomy. In this type, the uterus and cervix are removed.  Radical hysterectomy. In this type, the uterus, the cervix, and the tissue that holds the uterus in place (parametrium) are removed. What are the different ways a hysterectomy can be performed? There are many different ways a hysterectomy can be performed, including:  Abdominal hysterectomy. In this type, an incision is made in the abdomen. The uterus is removed through this incision.  Vaginal hysterectomy. In this type, an incision is made in the vagina. The uterus is removed through this incision. There are no abdominal incisions.  Conventional laparoscopic hysterectomy. In this type, three or four small  incisions are made in the abdomen. A thin, lighted tube with a camera (laparoscope) is inserted into one of the incisions. Other tools are put through the other incisions. The uterus is cut into small pieces. The small pieces are removed through the incisions or through the vagina.  Laparoscopically assisted vaginal hysterectomy (LAVH). In this type, three or four small incisions are made in the abdomen. Part of the surgery is performed laparoscopically and the other part is done  vaginally. The uterus is removed through the vagina.  Robot-assisted laparoscopic hysterectomy. In this type, a laparoscope and other tools are inserted into three or four small incisions in the abdomen. A computer-controlled device is used to give the surgeon a 3D image and to help control the surgical instruments. This allows for more precise movements of surgical instruments. The uterus is cut into small pieces and removed through the incisions or removed through the vagina. Discuss the options with your health care provider to determine which type is the right one for you. What are the risks? Generally, this is a safe procedure. However, problems may occur, including:  Bleeding and risk of blood transfusion. Tell your health care provider if you do not want to receive any blood products.  Blood clots in the legs or lung.  Infection.  Damage to other structures or organs.  Allergic reactions to medicines.  Changing to an abdominal hysterectomy from one of the other techniques. What to expect after a hysterectomy  You will be given pain medicine.  You may need to stay in the hospital for 1- 2 days to recover, depending on the type of hysterectomy you had.  Follow your health care provider's instructions about exercise, driving, and general activities. Ask your health care provider what activities are safe for you.  You will need to have someone with you for the first 3-5 days after you go home.  You will need  to follow up with your surgeon in 2-4 weeks after surgery to evaluate your progress.  If the ovaries are removed, you will have early menopause symptoms such as hot flashes, night sweats, and insomnia.  If you had a hysterectomy for a problem that was not cancer or not a condition that could lead to cancer, then you no longer need Pap tests. However, even if you no longer need a Pap test, a regular pelvic exam is a good idea to make sure no other problems are developing. Questions to ask your health care provider  Is a hysterectomy medically necessary? Do I have other treatment options for my condition?  What are my options for hysterectomy procedure?  What organs and tissues need to be removed?  What are the risks?  What are the benefits?  How long will I need to stay in the hospital after the procedure?  How long will I need to recover at home?  What symptoms can I expect after the procedure? Summary  A hysterectomy is a surgery in which the uterus is removed. The fallopian tubes and ovaries may be removed (bilateral salpingo-oophorectomy) as well.  This procedure may be done to treat various medical problems. After the procedure, a woman will no longer have menstrual periods nor will she be able to become pregnant.  Discuss the options with your health care provider to determine which type of hysterectomy is the right one for you. This information is not intended to replace advice given to you by your health care provider. Make sure you discuss any questions you have with your health care provider. Document Revised: 04/05/2017 Document Reviewed: 05/30/2016 Elsevier Patient Education  [place].  How to Use Chlorhexidine for Bathing Chlorhexidine gluconate (CHG) is a germ-killing (antiseptic) solution that is used to clean the skin. It can get rid of the bacteria that normally live on the skin and can keep them away for about 24 hours. To clean your skin with CHG, you may  be given:  A CHG solution to use in  the shower or as part of a sponge bath.  A prepackaged cloth that contains CHG. Cleaning your skin with CHG may help lower the risk for infection:  While you are staying in the intensive care unit of the hospital.  If you have a vascular access, such as a central line, to provide short-term or long-term access to your veins.  If you have a catheter to drain urine from your bladder.  If you are on a ventilator. A ventilator is a machine that helps you breathe by moving air in and out of your lungs.  After surgery. What are the risks? Risks of using CHG include:  A skin reaction.  Hearing loss, if CHG gets in your ears.  Eye injury, if CHG gets in your eyes and is not rinsed out.  The CHG product catching fire. Make sure that you avoid smoking and flames after applying CHG to your skin. Do not use CHG:  If you have a chlorhexidine allergy or have previously reacted to chlorhexidine.  On babies younger than 23 months of age. How to use CHG solution  Use CHG only as told by your health care provider, and follow the instructions on the label.  Use the full amount of CHG as directed. Usually, this is one bottle. During a shower Follow these steps when using CHG solution during a shower (unless your health care provider gives you different instructions): 1. Start the shower. 2. Use your normal soap and shampoo to wash your face and hair. 3. Turn off the shower or move out of the shower stream. 4. Pour the CHG onto a clean washcloth. Do not use any type of brush or rough-edged sponge. 5. Starting at your neck, lather your body down to your toes. Make sure you follow these instructions: ? If you will be having surgery, pay special attention to the part of your body where you will be having surgery. Scrub this area for at least 1 minute. ? Do not use CHG on your head or face. If the solution gets into your ears or eyes, rinse them well with  water. ? Avoid your genital area. ? Avoid any areas of skin that have broken skin, cuts, or scrapes. ? Scrub your back and under your arms. Make sure to wash skin folds. 6. Let the lather sit on your skin for 1-2 minutes or as long as told by your health care provider. 7. Thoroughly rinse your entire body in the shower. Make sure that all body creases and crevices are rinsed well. 8. Dry off with a clean towel. Do not put any substances on your body afterward--such as powder, lotion, or perfume--unless you are told to do so by your health care provider. Only use lotions that are recommended by the manufacturer. 9. Put on clean clothes or pajamas. 10. If it is the night before your surgery, sleep in clean sheets.  During a sponge bath Follow these steps when using CHG solution during a sponge bath (unless your health care provider gives you different instructions): 1. Use your normal soap and shampoo to wash your face and hair. 2. Pour the CHG onto a clean washcloth. 3. Starting at your neck, lather your body down to your toes. Make sure you follow these instructions: ? If you will be having surgery, pay special attention to the part of your body where you will be having surgery. Scrub this area for at least 1 minute. ? Do not use CHG on your head or  face. If the solution gets into your ears or eyes, rinse them well with water. ? Avoid your genital area. ? Avoid any areas of skin that have broken skin, cuts, or scrapes. ? Scrub your back and under your arms. Make sure to wash skin folds. 4. Let the lather sit on your skin for 1-2 minutes or as long as told by your health care provider. 5. Using a different clean, wet washcloth, thoroughly rinse your entire body. Make sure that all body creases and crevices are rinsed well. 6. Dry off with a clean towel. Do not put any substances on your body afterward--such as powder, lotion, or perfume--unless you are told to do so by your health care provider.  Only use lotions that are recommended by the manufacturer. 7. Put on clean clothes or pajamas. 8. If it is the night before your surgery, sleep in clean sheets. How to use CHG prepackaged cloths  Only use CHG cloths as told by your health care provider, and follow the instructions on the label.  Use the CHG cloth on clean, dry skin.  Do not use the CHG cloth on your head or face unless your health care provider tells you to.  When washing with the CHG cloth: ? Avoid your genital area. ? Avoid any areas of skin that have broken skin, cuts, or scrapes. Before surgery Follow these steps when using a CHG cloth to clean before surgery (unless your health care provider gives you different instructions): 1. Using the CHG cloth, vigorously scrub the part of your body where you will be having surgery. Scrub using a back-and-forth motion for 3 minutes. The area on your body should be completely wet with CHG when you are done scrubbing. 2. Do not rinse. Discard the cloth and let the area air-dry. Do not put any substances on the area afterward, such as powder, lotion, or perfume. 3. Put on clean clothes or pajamas. 4. If it is the night before your surgery, sleep in clean sheets.  For general bathing Follow these steps when using CHG cloths for general bathing (unless your health care provider gives you different instructions). 1. Use a separate CHG cloth for each area of your body. Make sure you wash between any folds of skin and between your fingers and toes. Wash your body in the following order, switching to a new cloth after each step: ? The front of your neck, shoulders, and chest. ? Both of your arms, under your arms, and your hands. ? Your stomach and groin area, avoiding the genitals. ? Your right leg and foot. ? Your left leg and foot. ? The back of your neck, your back, and your buttocks. 2. Do not rinse. Discard the cloth and let the area air-dry. Do not put any substances on your body  afterward--such as powder, lotion, or perfume--unless you are told to do so by your health care provider. Only use lotions that are recommended by the manufacturer. 3. Put on clean clothes or pajamas. Contact a health care provider if:  Your skin gets irritated after scrubbing.  You have questions about using your solution or cloth. Get help right away if:  Your eyes become very red or swollen.  Your eyes itch badly.  Your skin itches badly and is red or swollen.  Your hearing changes.  You have trouble seeing.  You have swelling or tingling in your mouth or throat.  You have trouble breathing.  You swallow any chlorhexidine. Summary  Chlorhexidine gluconate (  CHG) is a germ-killing (antiseptic) solution that is used to clean the skin. Cleaning your skin with CHG may help to lower your risk for infection.  You may be given CHG to use for bathing. It may be in a bottle or in a prepackaged cloth to use on your skin. Carefully follow your health care provider's instructions and the instructions on the product label.  Do not use CHG if you have a chlorhexidine allergy.  Contact your health care provider if your skin gets irritated after scrubbing. This information is not intended to replace advice given to you by your health care provider. Make sure you discuss any questions you have with your health care provider. Document Revised: 07/10/2018 Document Reviewed: 03/21/2017 Elsevier Patient Education  [place].

## 2019-09-11 NOTE — Progress Notes (Signed)
Colposcopy results show koilocytic atypia , no frank dysplasia. 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

## 2019-09-12 LAB — SARS CORONAVIRUS 2 (TAT 6-24 HRS): SARS Coronavirus 2: NEGATIVE

## 2019-09-14 LAB — ABO/RH: ABO/RH(D): A POS

## 2019-09-14 LAB — PREPARE RBC (CROSSMATCH)

## 2019-09-14 NOTE — H&P (Signed)
Subjective:    Julia Rangel is a 34 y.o. female presenting today with complaint of history of menorrhagia. She reports having a tubal ligation and states that her menstrual periods have been heavy since. She reports having used Mirena, Nuvaring, and the pill over the last few years. She no longer wants to be on any form of birth control and is requesting a hysterectomy. Patient's mother is in her 28's and has had her ovaries removed, but was still diagnosed with primary peritoneal carcinomatosis , BRCA positive.  Mother also has a low grade renal cancer.  The patient also reports a personal history of cervical cancer.  VHL, WRN and WT1.   At this time, I would recommend a hysterectomy where we will leave the cervix and ovaries.   Gynecologic History Patient's last menstrual period was 07/14/2019 (approximate). Contraception: condoms Last Pap: 07/28/2019. Results were: Pending Last mammogram: None. Results were:   Obstetric History                 OB History  Gravida Para Term Preterm AB Living  _0 SAB TAB Ectopic Multiple Live Births           0 3       # Outcome Date GA Lbr Len/2nd Weight Sex Delivery Anes PTL Lv  3 Term 05/08/17 [redacted]w[redacted]d 8 lb 14 oz (4.025 kg) M CS-LTranv Spinal  LIV  2 Term 03/29/14 362w0d8 lb 1.4 oz (3.668 kg) F CS-LTranv Spinal N LIV     Birth Comments: No problems at birth   1 Term 11/07/08 417w0d lb 3 oz (3.714 kg) F CS-LTranv EPI N LIV     Birth Comments: for FTP @ 9cm     Complications: Failure to Progress in First Stage      Review of Systems  Review of Systems  Constitutional: Negative for fever, chills, weight loss, malaise/fatigue and diaphoresis.  HENT: Negative for hearing loss, ear pain, nosebleeds, congestion, sore throat, neck pain, tinnitus and ear discharge.   Eyes: Negative for blurred vision, double vision, photophobia, pain, discharge and redness.  Respiratory: Negative for cough, hemoptysis, sputum production,  shortness of breath, wheezing and stridor.   Cardiovascular: Negative for chest pain, palpitations, orthopnea, claudication, leg swelling and PND.  Gastrointestinal: negative for abdominal pain. Negative for heartburn, nausea, vomiting, diarrhea, constipation, blood in stool and melena.  Genitourinary: Negative for dysuria, urgency, frequency, hematuria and flank pain.  Musculoskeletal: Negative for myalgias, back pain, joint pain and falls.  Skin: Negative for itching and rash.  Neurological: Negative for dizziness, tingling, tremors, sensory change, speech change, focal weakness, seizures, loss of consciousness, weakness and headaches.  Endo/Heme/Allergies: Negative for environmental allergies and polydipsia. Does not bruise/bleed easily.  Psychiatric/Behavioral: Negative for depression, suicidal ideas, hallucinations, memory loss and substance abuse. The patient is not nervous/anxious and does not have insomnia.      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 [id number] for colposcopy for history of LEEP with severe dysplasia.  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 W.  Discussed role for HPV in cervical dysplasia, need for surveillance. With the history of severe dysplasia, and now recurrent HPV, discussion is whether to remove cervix or preserve at time of hysterectomy.    Patient given informed consent, signed copy in the chart, time out was performed.  Placed in lithotomy position. Cervix viewed  with speculum and colposcope after application of acetic acid.  Assistant: Alice Rieger RN Colposcopy adequate? Yes  no visible lesions; biopsies obtained at n/a.   ECC specimen obtained.yes. Specimen was labelled and sent to pathology.   Colposcopy IMPRESSION: no visible dysplasia,  History of LEEP with pathology noting severe dysplasia.  Patient was given post procedure instructions. Will follow up pathology and manage  accordingly.  Routine preventative health maintenance measures emphasized.  Will follow up pathology, if normal, patient does not wish for continued annual monitoring of pap, so desires removal of cervix at time of hysterectomy.  Additionally, discussed her abdominal incision and the tatoo above it, on the right side just above old cesarean scar.  She desires that the incision be wide enough to symmetrically remove the scar from cesareans and the tatoo.  The line that would be needed to achieve this is discussed and marked on the skin for her review.   Objective   Physical Exam  Vitals reviewed. Constitutional: She is oriented to person, place, and time. She appears well-developed and well-nourished.  HENT:  Head: Normocephalic and atraumatic.        Right Ear: External ear normal.  Left Ear: External ear normal.  Nose: Nose normal.  Mouth/Throat: Oropharynx is clear and moist.  Eyes: Conjunctivae and EOM are normal. Pupils are equal, round, and reactive to light. Right eye exhibits no discharge. Left eye exhibits no discharge. No scleral icterus.  Neck: Normal range of motion. Neck supple. No tracheal deviation present. No thyromegaly present.  Cardiovascular: Normal rate, regular rhythm, normal heart sounds and intact distal pulses.  Exam reveals no gallop and no friction rub.   No murmur heard. Respiratory: Effort normal and breath sounds normal. No respiratory distress. She has no wheezes. She has no rales. She exhibits no tenderness.  GI: Soft. Bowel sounds are normal. She exhibits no distension and no mass. There is no tenderness. There is no rebound and no guarding.  Genitourinary:       Vulva is normal without lesions Vagina is pink moist without discharge Cervix normal in appearance and pap is done Uterus is normal size shape and contour Adnexa is negative with normal sized ovaries by sonogram  Musculoskeletal: Normal range of motion. She exhibits no edema and no  tenderness.  Neurological: She is alert and oriented to person, place, and time. She has normal reflexes. She displays normal reflexes. No cranial nerve deficit. She exhibits normal muscle tone. Coordination normal.  Skin: Skin is warm and dry. No rash noted. No erythema. No pallor.  Psychiatric: She has a normal mood and affect. Her behavior is normal. Judgment and thought content normal.     CBC Latest Ref Rng & Units 09/11/2019 05/23/2017 05/09/2017  WBC 4.0 - 10.5 K/uL 5.0 12.9(H) 6.9  Hemoglobin 12.0 - 15.0 g/dL 9.7(L) 11.3(L) 8.6(L)  Hematocrit 36.0 - 46.0 % 31.3(L) 36.0 26.1(L)  Platelets 150 - 400 K/uL 349 260 158    CMP Latest Ref Rng & Units 09/11/2019 05/23/2017 05/08/2017  Glucose 70 - 99 mg/dL 71 102(H) -  BUN 6 - 20 mg/dL 15 9 -  Creatinine 0.44 - 1.00 mg/dL 0.83 0.78 0.47  Sodium 135 - 145 mmol/L 136 135 -  Potassium 3.5 - 5.1 mmol/L 3.8 3.5 -  Chloride 98 - 111 mmol/L 105 102 -  CO2 22 - 32 mmol/L 24 22 -  Calcium 8.9 - 10.3 mg/dL 8.8(L) 8.8(L) -  Total Protein 6.5 - 8.1 g/dL 7.5 7.5 -  Total Bilirubin 0.3 - 1.2 mg/dL 0.3 0.4 -  Alkaline Phos 38 - 126 U/L 70 83 -  AST 15 - 41 U/L 18 40 -  ALT 0 - 44 U/L 18 65(H) -     Assessment:    Assessment  Menorrhagia,     Plan:    Plan  Will proceed toward hysterectomy, total abdominal hysterectomy, bilateral salpingectomy. Will excise old scar, including the tatoo just above the right corner of the Cesarean scar.

## 2019-09-15 ENCOUNTER — Inpatient Hospital Stay (HOSPITAL_COMMUNITY): Payer: BC Managed Care – PPO | Admitting: Anesthesiology

## 2019-09-15 ENCOUNTER — Encounter (HOSPITAL_COMMUNITY): Payer: Self-pay | Admitting: Obstetrics and Gynecology

## 2019-09-15 ENCOUNTER — Inpatient Hospital Stay (HOSPITAL_COMMUNITY)
Admission: RE | Admit: 2019-09-15 | Discharge: 2019-09-18 | DRG: 742 | Disposition: A | Discharge status: Home / Self Care | Payer: BC Managed Care – PPO | Attending: Obstetrics and Gynecology | Admitting: Obstetrics and Gynecology

## 2019-09-15 ENCOUNTER — Other Ambulatory Visit: Payer: Self-pay

## 2019-09-15 ENCOUNTER — Encounter (HOSPITAL_COMMUNITY)
Admission: RE | Disposition: A | Discharge status: Home / Self Care | Payer: Self-pay | Source: Home / Self Care | Attending: Obstetrics and Gynecology

## 2019-09-15 DIAGNOSIS — D62 Acute posthemorrhagic anemia: Secondary | ICD-10-CM | POA: Diagnosis present

## 2019-09-15 DIAGNOSIS — Z90710 Acquired absence of both cervix and uterus: Secondary | ICD-10-CM | POA: Diagnosis present

## 2019-09-15 DIAGNOSIS — D500 Iron deficiency anemia secondary to blood loss (chronic): Secondary | ICD-10-CM | POA: Diagnosis not present

## 2019-09-15 DIAGNOSIS — Z20822 Contact with and (suspected) exposure to covid-19: Secondary | ICD-10-CM | POA: Diagnosis present

## 2019-09-15 DIAGNOSIS — N920 Excessive and frequent menstruation with regular cycle: Secondary | ICD-10-CM | POA: Diagnosis present

## 2019-09-15 DIAGNOSIS — Z803 Family history of malignant neoplasm of breast: Secondary | ICD-10-CM

## 2019-09-15 DIAGNOSIS — D060 Carcinoma in situ of endocervix: Secondary | ICD-10-CM | POA: Diagnosis not present

## 2019-09-15 DIAGNOSIS — R339 Retention of urine, unspecified: Secondary | ICD-10-CM | POA: Diagnosis not present

## 2019-09-15 HISTORY — PX: HYSTERECTOMY ABDOMINAL WITH SALPINGECTOMY: SHX6725

## 2019-09-15 LAB — CBC
HCT: 28.4 % — ABNORMAL LOW (ref 36.0–46.0)
Hemoglobin: 8.8 g/dL — ABNORMAL LOW (ref 12.0–15.0)
MCH: 26.6 pg (ref 26.0–34.0)
MCHC: 31 g/dL (ref 30.0–36.0)
MCV: 85.8 fL (ref 80.0–100.0)
Platelets: 283 10*3/uL (ref 150–400)
RBC: 3.31 MIL/uL — ABNORMAL LOW (ref 3.87–5.11)
RDW: 14.3 % (ref 11.5–15.5)
WBC: 10.3 10*3/uL (ref 4.0–10.5)
nRBC: 0 % (ref 0.0–0.2)

## 2019-09-15 SURGERY — HYSTERECTOMY, TOTAL, ABDOMINAL, WITH SALPINGECTOMY
Anesthesia: General | Site: Abdomen

## 2019-09-15 MED ORDER — PROPOFOL 10 MG/ML IV BOLUS
INTRAVENOUS | Status: AC
  Filled 2019-09-15: qty 40

## 2019-09-15 MED ORDER — ALBUMIN HUMAN 25 % IV SOLN
12.5000 g | Freq: Once | INTRAVENOUS | Status: AC
  Administered 2019-09-15: 12.5 g via INTRAVENOUS
  Filled 2019-09-15: qty 50

## 2019-09-15 MED ORDER — GLYCOPYRROLATE 0.2 MG/ML IJ SOLN
INTRAMUSCULAR | Status: DC | PRN
  Administered 2019-09-15: .2 mg via INTRAVENOUS

## 2019-09-15 MED ORDER — TRANEXAMIC ACID-NACL 1000-0.7 MG/100ML-% IV SOLN
INTRAVENOUS | Status: AC
  Filled 2019-09-15: qty 100

## 2019-09-15 MED ORDER — PHENYLEPHRINE 40 MCG/ML (10ML) SYRINGE FOR IV PUSH (FOR BLOOD PRESSURE SUPPORT)
PREFILLED_SYRINGE | INTRAVENOUS | Status: AC
  Filled 2019-09-15: qty 10

## 2019-09-15 MED ORDER — CEFAZOLIN SODIUM-DEXTROSE 2-4 GM/100ML-% IV SOLN
INTRAVENOUS | Status: AC
  Filled 2019-09-15: qty 100

## 2019-09-15 MED ORDER — KETOROLAC TROMETHAMINE 10 MG PO TABS
10.0000 mg | ORAL_TABLET | Freq: Four times a day (QID) | ORAL | Status: AC
  Administered 2019-09-15 – 2019-09-17 (×6): 10 mg via ORAL
  Filled 2019-09-15 (×10): qty 1

## 2019-09-15 MED ORDER — FLUOXETINE HCL 20 MG PO CAPS
20.0000 mg | ORAL_CAPSULE | Freq: Every day | ORAL | Status: DC
  Administered 2019-09-16 – 2019-09-18 (×3): 20 mg via ORAL
  Filled 2019-09-15 (×3): qty 1

## 2019-09-15 MED ORDER — EPHEDRINE 5 MG/ML INJ
INTRAVENOUS | Status: AC
  Filled 2019-09-15: qty 20

## 2019-09-15 MED ORDER — SODIUM CHLORIDE 0.9% FLUSH: 9.0000 mL | INTRAVENOUS | Status: DC | PRN

## 2019-09-15 MED ORDER — ONDANSETRON HCL 4 MG/2ML IJ SOLN
4.0000 mg | Freq: Four times a day (QID) | INTRAMUSCULAR | Status: DC | PRN
  Administered 2019-09-15 – 2019-09-17 (×4): 4 mg via INTRAVENOUS
  Filled 2019-09-15: qty 2

## 2019-09-15 MED ORDER — MIDAZOLAM HCL 2 MG/2ML IJ SOLN
2.0000 mg | Freq: Once | INTRAMUSCULAR | Status: AC
  Administered 2019-09-15: 2 mg via INTRAVENOUS

## 2019-09-15 MED ORDER — BUPIVACAINE LIPOSOME 1.3 % IJ SUSP
INTRAMUSCULAR | Status: AC
  Filled 2019-09-15: qty 20

## 2019-09-15 MED ORDER — BUPIVACAINE LIPOSOME 1.3 % IJ SUSP
INTRAMUSCULAR | Status: DC | PRN
  Administered 2019-09-15: 20 mL

## 2019-09-15 MED ORDER — DIPHENHYDRAMINE HCL 50 MG/ML IJ SOLN
INTRAMUSCULAR | Status: AC
  Filled 2019-09-15: qty 1

## 2019-09-15 MED ORDER — HYDROMORPHONE 1 MG/ML IV SOLN: INTRAVENOUS | Status: DC

## 2019-09-15 MED ORDER — FENTANYL CITRATE (PF) 100 MCG/2ML IJ SOLN
INTRAMUSCULAR | Status: DC | PRN
  Administered 2019-09-15 (×2): 25 ug via INTRAVENOUS
  Administered 2019-09-15: 50 ug via INTRAVENOUS
  Administered 2019-09-15: 25 ug via INTRAVENOUS
  Administered 2019-09-15 (×2): 50 ug via INTRAVENOUS
  Administered 2019-09-15: 25 ug via INTRAVENOUS
  Administered 2019-09-15: 100 ug via INTRAVENOUS

## 2019-09-15 MED ORDER — ROCURONIUM BROMIDE 10 MG/ML (PF) SYRINGE
PREFILLED_SYRINGE | INTRAVENOUS | Status: AC
  Filled 2019-09-15: qty 20

## 2019-09-15 MED ORDER — HEMOSTATIC AGENTS (NO CHARGE) OPTIME
TOPICAL | Status: DC | PRN
  Administered 2019-09-15: 1 via TOPICAL

## 2019-09-15 MED ORDER — ONDANSETRON HCL 4 MG PO TABS: 4.0000 mg | ORAL_TABLET | Freq: Four times a day (QID) | ORAL | Status: DC | PRN

## 2019-09-15 MED ORDER — DIPHENHYDRAMINE HCL 50 MG/ML IJ SOLN
INTRAMUSCULAR | Status: DC | PRN
  Administered 2019-09-15: 50 mg via INTRAVENOUS

## 2019-09-15 MED ORDER — LIDOCAINE HCL (CARDIAC) PF 100 MG/5ML IV SOSY
PREFILLED_SYRINGE | INTRAVENOUS | Status: DC | PRN
  Administered 2019-09-15: 100 mg via INTRAVENOUS

## 2019-09-15 MED ORDER — MIDAZOLAM HCL 2 MG/2ML IJ SOLN
INTRAMUSCULAR | Status: AC
  Filled 2019-09-15: qty 2

## 2019-09-15 MED ORDER — LIDOCAINE 2% (20 MG/ML) 5 ML SYRINGE
INTRAMUSCULAR | Status: AC
  Filled 2019-09-15: qty 10

## 2019-09-15 MED ORDER — SCOPOLAMINE 1 MG/3DAYS TD PT72
MEDICATED_PATCH | TRANSDERMAL | Status: AC
  Filled 2019-09-15: qty 1

## 2019-09-15 MED ORDER — NEOSTIGMINE METHYLSULFATE 3 MG/3ML IV SOSY
PREFILLED_SYRINGE | INTRAVENOUS | Status: AC
  Filled 2019-09-15: qty 3

## 2019-09-15 MED ORDER — SODIUM CHLORIDE 0.9 % IV SOLN: INTRAVENOUS | Status: DC

## 2019-09-15 MED ORDER — ONDANSETRON HCL 4 MG/2ML IJ SOLN
4.0000 mg | Freq: Four times a day (QID) | INTRAMUSCULAR | Status: DC | PRN
  Administered 2019-09-17: 4 mg via INTRAVENOUS
  Filled 2019-09-15 (×4): qty 2

## 2019-09-15 MED ORDER — DIPHENHYDRAMINE HCL 12.5 MG/5ML PO ELIX: 12.5000 mg | ORAL_SOLUTION | Freq: Four times a day (QID) | ORAL | Status: DC | PRN

## 2019-09-15 MED ORDER — KETOROLAC TROMETHAMINE 30 MG/ML IJ SOLN
INTRAMUSCULAR | Status: AC
  Filled 2019-09-15: qty 1

## 2019-09-15 MED ORDER — PROPOFOL 10 MG/ML IV BOLUS
INTRAVENOUS | Status: DC | PRN
  Administered 2019-09-15: 200 mg via INTRAVENOUS
  Administered 2019-09-15: 100 mg via INTRAVENOUS
  Administered 2019-09-15 (×2): 50 mg via INTRAVENOUS

## 2019-09-15 MED ORDER — FENTANYL CITRATE (PF) 250 MCG/5ML IJ SOLN
INTRAMUSCULAR | Status: AC
  Filled 2019-09-15: qty 5

## 2019-09-15 MED ORDER — PROMETHAZINE HCL 25 MG/ML IJ SOLN
6.2500 mg | INTRAMUSCULAR | Status: DC | PRN
  Administered 2019-09-15: 6.25 mg via INTRAVENOUS
  Filled 2019-09-15: qty 1

## 2019-09-15 MED ORDER — DIPHENHYDRAMINE HCL 50 MG/ML IJ SOLN: 12.5000 mg | Freq: Four times a day (QID) | INTRAMUSCULAR | Status: DC | PRN

## 2019-09-15 MED ORDER — DEXAMETHASONE SODIUM PHOSPHATE 4 MG/ML IJ SOLN
INTRAMUSCULAR | Status: DC | PRN
  Administered 2019-09-15: 10 mg via INTRAVENOUS

## 2019-09-15 MED ORDER — FENTANYL CITRATE (PF) 100 MCG/2ML IJ SOLN
INTRAMUSCULAR | Status: AC
  Filled 2019-09-15: qty 2

## 2019-09-15 MED ORDER — SUGAMMADEX SODIUM 200 MG/2ML IV SOLN
INTRAVENOUS | Status: DC | PRN
Start: 2019-09-15 — End: 2019-09-15
  Administered 2019-09-15: 200 mg via INTRAVENOUS

## 2019-09-15 MED ORDER — GLYCOPYRROLATE PF 0.2 MG/ML IJ SOSY
PREFILLED_SYRINGE | INTRAMUSCULAR | Status: AC
  Filled 2019-09-15: qty 5

## 2019-09-15 MED ORDER — KETOROLAC TROMETHAMINE 30 MG/ML IJ SOLN
30.0000 mg | Freq: Once | INTRAMUSCULAR | Status: AC
  Administered 2019-09-15: 30 mg via INTRAVENOUS

## 2019-09-15 MED ORDER — 0.9 % SODIUM CHLORIDE (POUR BTL) OPTIME
TOPICAL | Status: DC | PRN
  Administered 2019-09-15: 2000 mL

## 2019-09-15 MED ORDER — HYDROMORPHONE HCL 1 MG/ML IJ SOLN
0.2500 mg | INTRAMUSCULAR | Status: DC | PRN
  Administered 2019-09-15 (×3): 0.5 mg via INTRAVENOUS
  Filled 2019-09-15 (×3): qty 0.5

## 2019-09-15 MED ORDER — GABAPENTIN 100 MG PO CAPS
100.0000 mg | ORAL_CAPSULE | Freq: Two times a day (BID) | ORAL | Status: DC
  Administered 2019-09-15 – 2019-09-18 (×6): 100 mg via ORAL
  Filled 2019-09-15 (×6): qty 1

## 2019-09-15 MED ORDER — HYDROMORPHONE 1 MG/ML IV SOLN
INTRAVENOUS | Status: DC
  Administered 2019-09-16: 4.2 mg via INTRAVENOUS
  Administered 2019-09-17: 0.3 mg via INTRAVENOUS
  Administered 2019-09-17: 2.7 mg via INTRAVENOUS
  Administered 2019-09-17: 0.6 mg via INTRAVENOUS
  Filled 2019-09-15 (×2): qty 30

## 2019-09-15 MED ORDER — PHENYLEPHRINE HCL (PRESSORS) 10 MG/ML IV SOLN
INTRAVENOUS | Status: DC | PRN
Start: 2019-09-15 — End: 2019-09-15
  Administered 2019-09-15 (×4): 80 ug via INTRAVENOUS

## 2019-09-15 MED ORDER — OXYCODONE HCL 5 MG PO TABS
5.0000 mg | ORAL_TABLET | ORAL | Status: DC | PRN
  Administered 2019-09-15: 5 mg via ORAL
  Administered 2019-09-16 (×3): 10 mg via ORAL
  Administered 2019-09-17 (×2): 5 mg via ORAL
  Administered 2019-09-17 – 2019-09-18 (×4): 10 mg via ORAL
  Administered 2019-09-18: 5 mg via ORAL
  Administered 2019-09-18: 10 mg via ORAL
  Filled 2019-09-15 (×3): qty 2
  Filled 2019-09-15: qty 1
  Filled 2019-09-15: qty 2
  Filled 2019-09-15: qty 1
  Filled 2019-09-15 (×2): qty 2
  Filled 2019-09-15: qty 1
  Filled 2019-09-15 (×2): qty 2
  Filled 2019-09-15 (×2): qty 1

## 2019-09-15 MED ORDER — LACTATED RINGERS IV SOLN
INTRAVENOUS | Status: DC | PRN
Start: 2019-09-15 — End: 2019-09-15

## 2019-09-15 MED ORDER — PANTOPRAZOLE SODIUM 40 MG PO TBEC
40.0000 mg | DELAYED_RELEASE_TABLET | Freq: Every day | ORAL | Status: DC
  Administered 2019-09-16 – 2019-09-18 (×3): 40 mg via ORAL
  Filled 2019-09-15 (×4): qty 1

## 2019-09-15 MED ORDER — TRANEXAMIC ACID-NACL 1000-0.7 MG/100ML-% IV SOLN
1000.0000 mg | INTRAVENOUS | Status: AC
  Administered 2019-09-15: 08:00:00 1000 mg via INTRAVENOUS

## 2019-09-15 MED ORDER — ONDANSETRON HCL 4 MG/2ML IJ SOLN
INTRAMUSCULAR | Status: AC
  Filled 2019-09-15: qty 2

## 2019-09-15 MED ORDER — MEPERIDINE HCL 50 MG/ML IJ SOLN: 6.2500 mg | INTRAMUSCULAR | Status: DC | PRN

## 2019-09-15 MED ORDER — SCOPOLAMINE 1 MG/3DAYS TD PT72
1.0000 | MEDICATED_PATCH | Freq: Once | TRANSDERMAL | Status: DC
  Administered 2019-09-15: 1.5 mg via TRANSDERMAL

## 2019-09-15 MED ORDER — LACTATED RINGERS IV SOLN: Freq: Once | INTRAVENOUS | Status: AC

## 2019-09-15 MED ORDER — DEXAMETHASONE SODIUM PHOSPHATE 10 MG/ML IJ SOLN
INTRAMUSCULAR | Status: AC
  Filled 2019-09-15: qty 1

## 2019-09-15 MED ORDER — ENOXAPARIN SODIUM 40 MG/0.4ML ~~LOC~~ SOLN
40.0000 mg | SUBCUTANEOUS | Status: DC
  Administered 2019-09-16 – 2019-09-17 (×2): 40 mg via SUBCUTANEOUS
  Filled 2019-09-15 (×2): qty 0.4

## 2019-09-15 MED ORDER — ROCURONIUM BROMIDE 100 MG/10ML IV SOLN
INTRAVENOUS | Status: DC | PRN
  Administered 2019-09-15: 10 mg via INTRAVENOUS
  Administered 2019-09-15: 5 mg via INTRAVENOUS
  Administered 2019-09-15 (×2): 10 mg via INTRAVENOUS
  Administered 2019-09-15: 60 mg via INTRAVENOUS

## 2019-09-15 MED ORDER — CEFAZOLIN SODIUM-DEXTROSE 2-4 GM/100ML-% IV SOLN
2.0000 g | INTRAVENOUS | Status: AC
  Administered 2019-09-15: 08:00:00 2 g via INTRAVENOUS
  Filled 2019-09-15: qty 100

## 2019-09-15 MED ORDER — ONDANSETRON HCL 4 MG/2ML IJ SOLN
INTRAMUSCULAR | Status: DC | PRN
  Administered 2019-09-15: 4 mg via INTRAVENOUS

## 2019-09-15 MED ORDER — NALOXONE HCL 0.4 MG/ML IJ SOLN: 0.4000 mg | INTRAMUSCULAR | Status: DC | PRN

## 2019-09-15 SURGICAL SUPPLY — 48 items
APL SKNCLS STERI-STRIP NONHPOA (GAUZE/BANDAGES/DRESSINGS) ×2
BENZOIN TINCTURE PRP APPL 2/3 (GAUZE/BANDAGES/DRESSINGS) ×4 IMPLANT
BLADE SURG SZ10 CARB STEEL (BLADE) ×4 IMPLANT
CLOSURE WOUND 1/2 X4 (GAUZE/BANDAGES/DRESSINGS) ×2
CLOTH BEACON ORANGE TIMEOUT ST (SAFETY) ×4 IMPLANT
COVER LIGHT HANDLE STERIS (MISCELLANEOUS) ×8 IMPLANT
COVER WAND RF STERILE (DRAPES) ×4 IMPLANT
DRAPE WARM FLUID 44X44 (DRAPES) ×4 IMPLANT
DRSG OPSITE POSTOP 4X10 (GAUZE/BANDAGES/DRESSINGS) ×4 IMPLANT
DRSG OPSITE POSTOP 4X8 (GAUZE/BANDAGES/DRESSINGS) ×3 IMPLANT
DURAPREP 26ML APPLICATOR (WOUND CARE) ×4 IMPLANT
ELECT REM PT RETURN 9FT ADLT (ELECTROSURGICAL) ×4
ELECTRODE REM PT RTRN 9FT ADLT (ELECTROSURGICAL) ×2 IMPLANT
GAUZE SPONGE 4X4 16PLY XRAY LF (GAUZE/BANDAGES/DRESSINGS) ×4 IMPLANT
GLOVE BIO SURGEON STRL SZ 6.5 (GLOVE) ×2 IMPLANT
GLOVE BIO SURGEONS STRL SZ 6.5 (GLOVE) ×1
GLOVE BIOGEL PI IND STRL 7.0 (GLOVE) ×7 IMPLANT
GLOVE BIOGEL PI IND STRL 9 (GLOVE) ×2 IMPLANT
GLOVE BIOGEL PI INDICATOR 7.0 (GLOVE) ×8
GLOVE BIOGEL PI INDICATOR 9 (GLOVE) ×2
GLOVE ECLIPSE 6.5 STRL STRAW (GLOVE) ×3 IMPLANT
GLOVE ECLIPSE 9.0 STRL (GLOVE) ×4 IMPLANT
GOWN SPEC L3 XXLG W/TWL (GOWN DISPOSABLE) ×4 IMPLANT
GOWN STRL REUS W/TWL LRG LVL3 (GOWN DISPOSABLE) ×8 IMPLANT
HEMOSTAT ARISTA ABSORB 3G PWDR (HEMOSTASIS) ×3 IMPLANT
INST SET MAJOR GENERAL (KITS) ×4 IMPLANT
KIT TURNOVER KIT A (KITS) ×4 IMPLANT
MANIFOLD NEPTUNE II (INSTRUMENTS) ×4 IMPLANT
NDL HYPO 25X1 1.5 SAFETY (NEEDLE) ×1 IMPLANT
NEEDLE HYPO 25X1 1.5 SAFETY (NEEDLE) ×4 IMPLANT
NS IRRIG 1000ML POUR BTL (IV SOLUTION) ×8 IMPLANT
PACK MAJOR ABDOMINAL (CUSTOM PROCEDURE TRAY) ×4 IMPLANT
PAD ARMBOARD 7.5X6 YLW CONV (MISCELLANEOUS) ×4 IMPLANT
PENCIL SMOKE EVACUATOR (MISCELLANEOUS) ×3 IMPLANT
RETRACTOR WOUND ALXS 18CM MED (MISCELLANEOUS) ×1 IMPLANT
RTRCTR WOUND ALEXIS O 18CM MED (MISCELLANEOUS) ×4
SET BASIN LINEN APH (SET/KITS/TRAYS/PACK) ×4 IMPLANT
STRIP CLOSURE SKIN 1/2X4 (GAUZE/BANDAGES/DRESSINGS) ×6 IMPLANT
SUT CHROMIC 0 CT 1 (SUTURE) ×53 IMPLANT
SUT CHROMIC 2 0 CT 1 (SUTURE) ×7 IMPLANT
SUT PLAIN CT 1/2CIR 2-0 27IN (SUTURE) ×10 IMPLANT
SUT VIC AB 0 CT1 27 (SUTURE) ×4
SUT VIC AB 0 CT1 27XBRD ANTBC (SUTURE) ×2 IMPLANT
SUT VICRYL 4 0 KS 27 (SUTURE) ×4 IMPLANT
SYR BULB IRRIG 60ML STRL (SYRINGE) ×3 IMPLANT
SYR CONTROL 10ML LL (SYRINGE) ×4 IMPLANT
TOWEL SURG RFD BLUE STRL DISP (DISPOSABLE) ×4 IMPLANT
TRAY FOLEY MTR SLVR 16FR STAT (SET/KITS/TRAYS/PACK) ×4 IMPLANT

## 2019-09-15 NOTE — Anesthesia Procedure Notes (Addendum)
Procedure Name: Intubation Date/Time: 09/15/2019 7:48 AM Performed by: Georgeanne Nim, CRNA Pre-anesthesia Checklist: Patient identified, Emergency Drugs available, Suction available, Patient being monitored and Timeout performed Patient Re-evaluated:Patient Re-evaluated prior to induction Oxygen Delivery Method: Circle system utilized Preoxygenation: Pre-oxygenation with 100% oxygen Induction Type: IV induction Ventilation: Mask ventilation without difficulty Laryngoscope Size: Mac and 4 (unable to see under epiglottis easily;converted to glide) Grade View: Grade III Tube size: 7.0 mm Number of attempts: 2 Airway Equipment and Method: Stylet and Video-laryngoscopy Placement Confirmation: ETT inserted through vocal cords under direct vision,  positive ETCO2,  CO2 detector and breath sounds checked- equal and bilateral Secured at: 22 cm Tube secured with: Tape Dental Injury: Teeth and Oropharynx as per pre-operative assessment  Difficulty Due To: Difficulty was unanticipated

## 2019-09-15 NOTE — Progress Notes (Signed)
Day of Surgery Procedure(s) (LRB): HYSTERECTOMY ABDOMINAL and OPEN BILATERAL SALPINGECTOMY (N/A)  Subjective: Patient reports incisional pain. Rates it 8/10. On Dilaudid PCA.    Objective: I have reviewed patient's vital signs and labs. BP 116/72   Pulse 93   Temp 98 F (36.7 C)   Resp 16   Ht 5\' 6"  (1.676 m)   Wt 92.1 kg   SpO2 99%   BMI 32.77 kg/m   General: cooperative, appears stated age, fatigued and mild distress GI: soft, non-tender; bowel sounds normal; no masses,  no organomegaly and incision: clean, dry and intact  Assessment: s/p Procedure(s): HYSTERECTOMY ABDOMINAL and OPEN BILATERAL SALPINGECTOMY (N/A): stable  Plan: begin IV at 125, was left off previously.  Expect pt to be here til Thursday.  LOS: 0 days    Jonnie Kind 09/15/2019, 6:28 PM

## 2019-09-15 NOTE — Anesthesia Preprocedure Evaluation (Signed)
Anesthesia Evaluation  Patient identified by MRN, date of birth, ID band Patient awake    Reviewed: Allergy & Precautions, NPO status , Patient's Chart, lab work & pertinent test results  History of Anesthesia Complications (+) PONV and history of anesthetic complications  Airway Mallampati: II  TM Distance: >3 FB Neck ROM: Full    Dental  (+) Dental Advisory Given, Teeth Intact   Pulmonary    Pulmonary exam normal breath sounds clear to auscultation       Cardiovascular Exercise Tolerance: Good Normal cardiovascular exam Rhythm:Regular Rate:Normal     Neuro/Psych  Headaches, PSYCHIATRIC DISORDERS Anxiety Depression    GI/Hepatic   Endo/Other  diabetes (gestational)  Renal/GU Renal disease (stones)     Musculoskeletal   Abdominal   Peds  Hematology  (+) anemia ,   Anesthesia Other Findings   Reproductive/Obstetrics                            Anesthesia Physical Anesthesia Plan  ASA: II  Anesthesia Plan: General   Post-op Pain Management:    Induction: Intravenous  PONV Risk Score and Plan: 4 or greater and Ondansetron, Dexamethasone, Midazolam, Scopolamine patch - Pre-op and Diphenhydramine  Airway Management Planned: Oral ETT  Additional Equipment:   Intra-op Plan:   Post-operative Plan:   Informed Consent: I have reviewed the patients History and Physical, chart, labs and discussed the procedure including the risks, benefits and alternatives for the proposed anesthesia with the patient or authorized representative who has indicated his/her understanding and acceptance.     Dental advisory given  Plan Discussed with: Surgeon and CRNA  Anesthesia Plan Comments:        Anesthesia Quick Evaluation

## 2019-09-15 NOTE — Brief Op Note (Signed)
09/15/2019  10:21 AM  PATIENT:  Julia Rangel  34 y.o. female  PRE-OPERATIVE DIAGNOSIS:  heavy menses with anemia, cancer risk reduction, hystory of severe dysplasia  POST-OPERATIVE DIAGNOSIS:  heavy menses with anemia, cancer risk reduction, history of severe dysplasia  PROCEDURE:  Procedure(s): HYSTERECTOMY ABDOMINAL and OPEN BILATERAL SALPINGECTOMY (N/A)  SURGEON:  Surgeon(s) and Role:    Jonnie Kind, MD - Primary  PHYSICIAN ASSISTANT:   ASSISTANTS:    ANESTHESIA:   general, with Exparel injection in the skin and fascia  EBL:  600 mL   BLOOD ADMINISTERED:none  DRAINS: Urinary Catheter (Foley)   LOCAL MEDICATIONS USED:  OTHER Exparel  SPECIMEN:  Source of Specimen:  Uterus cervix tubes  DISPOSITION OF SPECIMEN:  PATHOLOGY  COUNTS:  YES  TOURNIQUET:  * No tourniquets in log *  DICTATION: .Dragon Dictation  PLAN OF CARE: Admit to inpatient   PATIENT DISPOSITION:  PACU - hemodynamically stable.   Delay start of Pharmacological VTE agent (>24hrs) due to surgical blood loss or risk of bleeding: not applicable Details of procedure: Patient was taken the operating room prepped and draped for lower abdominal surgery with vaginal prepping with Betadine, Ancef administered,, tranexamic acid x1 g administered.  Timeout was conducted and procedure confirmed by surgical team.  The previously demarcated old scar and the tattoo over its right corner were excised with an ellipse of skin just inside the anterior superior iliac crest on each side to the midline, approximately 5 cm wide at its maximum, including the old cicatrix and the tattoo.  Sharp dissection down halfway through the fatty tissue with removal of the old fibrous scarring from the cesarean sections down to that level.  Transverse incision was then performed through the remaining fatty tissue and the fascia elevating the fascial layer off the underlying muscles.  The peritoneum was carefully entered while elevating  the peritoneum and after a tiny window was opened over the omentum blunt index finger inspection revealed that there were no bowel adhesions to the anterior abdominal wall, and that there was some small omental adhesions to the upper aspects of the previous midline peritoneal opening.  This was taken down easily.  The opening was sufficient that a small Alexis wound retractor could be placed and bowel packed away.  Attention of the uterus was then easily achieved.  There was significant adhesions in the area of the bladder.  Attention was first to the left round ligament which was clamped cut and suture-ligated followed by isolation of the left utero-ovarian ligament, which was doubly clamped with Kelly clamps trapped transected and doubly ligated with 0 Vicryl.  0 Vicryl was used throughout the case except as specifically indicated otherwise. The right side was treated in a similar fashion.  Bladder flap was then developed on the anterior side.  There was some rather fibrous adhesions but I was able to tunnel and laterally beneath the old C-section scar isolate the adhesions staying well away from the bladder and then sharply dissect this off of the anterior lower uterine segment.  Urine drained clear and there was no suspicion of bladder injury. The the upper cardinal ligaments with uterine vessels was then clamped with curved Heaney clamp on each side Kelly clamp for backbleeding transected and suture ligated with 0 chromic.  Remaining cardinal ligaments were clamped cut and suture-ligated and then the uterine body amputated off of the cervical and lower uterine segment stump.  This allowed visualization of the endocervical canal.  A right angle  clamp could be placed through the cervix identifying the anterior cervical vaginal fornix where a stab incision could be made in the anterior cervical vaginal fornix to minimize the shortening of the vagina.  Mayo scissors were then used to circumscribe the cervix  remained closely adherent to the cervix and leaving a part of the cervical vaginal ring in place.  There was a small arterial bleeder on the patient's left side that required individual clamping and suturing.  This resulted in the majority of the blood loss during the case.  The Alldredge stitches were placed at each lateral vaginal angle to incorporate the vaginal cuff into the lower cardinal ligament.  Followed by closure of the cuff in a front to back fashion with a series of interrupted 0 chromic suture with adequate hemostasis achieved cuff was inspected carefully irrigated and point cautery used as necessary.  Bladder was checked and was well out of the area of surgical intervention.  Arista was applied after irrigation of the pelvis.  Bilateral salpingectomy followed.  Salpingectomy was then performed by taking the fallopian tube on the patient left side elevating it, Kelly clamp and just beneath the fimbria, amputating the distal fallopian tube off of the mesosalpinx, ligating with 2-0 chromic, then removing the remaining remnant of the left tube with its associated Filshie clip.  On the right side a similar procedure was performed however on the side there was no Filshie clip visualized.  Inspection the pelvis had not shown any evidence of Filshie clip in the pelvis.  Hemostasis at this point was quite good pelvis was irrigated again, a single suture attaching the bladder flap over to the posterior aspects of the vaginal cuff was then performed.  The pedicles were inspected again and confirmed as hemostatic.  All equipment was removed and sponge counts correct.  Laparotomy equipment was removed, the anterior peritoneum closed with 2-0 chromic, the fascia closed with a running 0 Vicryl.  Subcutaneous tissues irrigated, and reapproximated, releasing the remaining fibrous tissue on the inferior aspects of the incision and placing horizontal mattress sutures in 2 layers in the fatty tissue to reapproximate skin  edges and reduce potential space.  Subcuticular 4-0 Vicryl closure of the skin followed with excellent hemostasis Steri-Strips and benzoin were applied and patient recovery room in stable condition the patient received 1 unit of albumin, maintain good urinary output throughout the case, did have some modest low pressures during the case which stabilized. End of dictation

## 2019-09-15 NOTE — Anesthesia Postprocedure Evaluation (Signed)
Anesthesia Post Note  Patient: Julia Rangel  Procedure(s) Performed: HYSTERECTOMY ABDOMINAL and OPEN BILATERAL SALPINGECTOMY (N/A Abdomen)  Patient location during evaluation: PACU Anesthesia Type: General Level of consciousness: patient cooperative and awake Pain management: pain level not controlled Vital Signs Assessment: post-procedure vital signs reviewed and stable Respiratory status: spontaneous breathing Cardiovascular status: stable Postop Assessment: no apparent nausea or vomiting Anesthetic complications: no     Last Vitals:  Vitals:   09/15/19 1021 09/15/19 1025  BP:    Pulse: 91 91  Resp: 16 18  Temp:    SpO2: 93% 96%    Last Pain:  Vitals:   09/15/19 1035  TempSrc:   PainSc: 8                  Raymel Cull

## 2019-09-15 NOTE — Transfer of Care (Signed)
Immediate Anesthesia Transfer of Care Note  Patient: Julia Rangel  Procedure(s) Performed: HYSTERECTOMY ABDOMINAL and OPEN BILATERAL SALPINGECTOMY (N/A Abdomen)  Patient Location: PACU  Anesthesia Type:General  Level of Consciousness: awake and patient cooperative  Airway & Oxygen Therapy: Patient Spontanous Breathing  Post-op Assessment: Report given to RN and Post -op Vital signs reviewed and stable  Post vital signs: Reviewed and stable  Last Vitals:  Vitals Value Taken Time  BP 100/61 09/15/19 1007  Temp 36.7 C 09/15/19 1007  Pulse 87 09/15/19 1011  Resp 18 09/15/19 1011  SpO2 95 % 09/15/19 1011  Vitals shown include unvalidated device data.  Last Pain:  Vitals:   09/15/19 0716  TempSrc:   PainSc: 0-No pain      Patients Stated Pain Goal: 5 [id number] [phone]  Complications: No apparent anesthesia complications

## 2019-09-15 NOTE — Op Note (Signed)
09/15/2019  10:21 AM  PATIENT:  Julia Rangel  34 y.o. female  PRE-OPERATIVE DIAGNOSIS:  heavy menses with anemia, cancer risk reduction, hystory of severe dysplasia  POST-OPERATIVE DIAGNOSIS:  heavy menses with anemia, cancer risk reduction, history of severe dysplasia  PROCEDURE:  Procedure(s): HYSTERECTOMY ABDOMINAL and OPEN BILATERAL SALPINGECTOMY (N/A)  SURGEON:  Surgeon(s) and Role:    Jonnie Kind, MD - Primary  PHYSICIAN ASSISTANT:   ASSISTANTS:    ANESTHESIA:   general, with Exparel injection in the skin and fascia  EBL:  600 mL   BLOOD ADMINISTERED:none  DRAINS: Urinary Catheter (Foley)   LOCAL MEDICATIONS USED:  OTHER Exparel  SPECIMEN:  Source of Specimen:  Uterus cervix tubes  DISPOSITION OF SPECIMEN:  PATHOLOGY  COUNTS:  YES  TOURNIQUET:  * No tourniquets in log *  DICTATION: .Dragon Dictation  PLAN OF CARE: Admit to inpatient   PATIENT DISPOSITION:  PACU - hemodynamically stable.   Delay start of Pharmacological VTE agent (>24hrs) due to surgical blood loss or risk of bleeding: not applicable Details of procedure: Patient was taken the operating room prepped and draped for lower abdominal surgery with vaginal prepping with Betadine, Ancef administered,, tranexamic acid x1 g administered.  Timeout was conducted and procedure confirmed by surgical team.  The previously demarcated old scar and the tattoo over its right corner were excised with an ellipse of skin just inside the anterior superior iliac crest on each side to the midline, approximately 5 cm wide at its maximum, including the old cicatrix and the tattoo.  Sharp dissection down halfway through the fatty tissue with removal of the old fibrous scarring from the cesarean sections down to that level.  Transverse incision was then performed through the remaining fatty tissue and the fascia elevating the fascial layer off the underlying muscles.  The peritoneum was carefully entered while elevating  the peritoneum and after a tiny window was opened over the omentum blunt index finger inspection revealed that there were no bowel adhesions to the anterior abdominal wall, and that there was some small omental adhesions to the upper aspects of the previous midline peritoneal opening.  This was taken down easily.  The opening was sufficient that a small Alexis wound retractor could be placed and bowel packed away.  Attention of the uterus was then easily achieved.  There was significant adhesions in the area of the bladder.  Attention was first to the left round ligament which was clamped cut and suture-ligated followed by isolation of the left utero-ovarian ligament, which was doubly clamped with Kelly clamps trapped transected and doubly ligated with 0 Vicryl.  0 Vicryl was used throughout the case except as specifically indicated otherwise. The right side was treated in a similar fashion.  Bladder flap was then developed on the anterior side.  There was some rather fibrous adhesions but I was able to tunnel and laterally beneath the old C-section scar isolate the adhesions staying well away from the bladder and then sharply dissect this off of the anterior lower uterine segment.  Urine drained clear and there was no suspicion of bladder injury. The the upper cardinal ligaments with uterine vessels was then clamped with curved Heaney clamp on each side Kelly clamp for backbleeding transected and suture ligated with 0 chromic.  Remaining cardinal ligaments were clamped cut and suture-ligated and then the uterine body amputated off of the cervical and lower uterine segment stump.  This allowed visualization of the endocervical canal.  A right angle  clamp could be placed through the cervix identifying the anterior cervical vaginal fornix where a stab incision could be made in the anterior cervical vaginal fornix to minimize the shortening of the vagina.  Mayo scissors were then used to circumscribe the cervix  remained closely adherent to the cervix and leaving a part of the cervical vaginal ring in place.  There was a small arterial bleeder on the patient's left side that required individual clamping and suturing.  This resulted in the majority of the blood loss during the case.  The Alldredge stitches were placed at each lateral vaginal angle to incorporate the vaginal cuff into the lower cardinal ligament.  Followed by closure of the cuff in a front to back fashion with a series of interrupted 0 chromic suture with adequate hemostasis achieved cuff was inspected carefully irrigated and point cautery used as necessary.  Bladder was checked and was well out of the area of surgical intervention.  Arista was applied after irrigation of the pelvis.  Bilateral salpingectomy followed.  Salpingectomy was then performed by taking the fallopian tube on the patient left side elevating it, Kelly clamp and just beneath the fimbria, amputating the distal fallopian tube off of the mesosalpinx, ligating with 2-0 chromic, then removing the remaining remnant of the left tube with its associated Filshie clip.  On the right side a similar procedure was performed however on the side there was no Filshie clip visualized.  Inspection the pelvis had not shown any evidence of Filshie clip in the pelvis.  Hemostasis at this point was quite good pelvis was irrigated again, a single suture attaching the bladder flap over to the posterior aspects of the vaginal cuff was then performed.  The pedicles were inspected again and confirmed as hemostatic.  All equipment was removed and sponge counts correct.  Laparotomy equipment was removed, the anterior peritoneum closed with 2-0 chromic, the fascia closed with a running 0 Vicryl.  Subcutaneous tissues irrigated, and reapproximated, releasing the remaining fibrous tissue on the inferior aspects of the incision and placing horizontal mattress sutures in 2 layers in the fatty tissue to reapproximate skin  edges and reduce potential space.  Subcuticular 4-0 Vicryl closure of the skin followed with excellent hemostasis Steri-Strips and benzoin were applied and patient recovery room in stable condition the patient received 1 unit of albumin, maintain good urinary output throughout the case, did have some modest low pressures during the case which stabilized. End of dictation

## 2019-09-15 NOTE — Progress Notes (Signed)
Noted a flat mottled rash to pt's chest area brought to attention by CRNA Rash was not irritating to patient. Rash began to resolve and fade within 10 minutes later.

## 2019-09-15 NOTE — Interval H&P Note (Signed)
History and Physical Interval Note:  09/15/2019 7:08 AM  Julia Rangel  has presented today for surgery, with the diagnosis of menorrhagia.  The various methods of treatment have been discussed with the patient and family. After consideration of risks, benefits and other options for treatment, the patient has consented to  Procedure(s): HYSTERECTOMY SUPRACERVICAL ABDOMINAL and OPEN BILATERAL SALPINGECTOMY (N/A) as a surgical intervention.  The patient's history has been reviewed, patient examined, no change in status, stable for surgery.  I have reviewed the patient's chart and labs.  Questions were answered to the patient's satisfaction.   Pt had mag citrate yesterday, good results. Now completed. Pt has Type and Cross completed x 2 units. Pt will receive TXA x 1 gram.  Jonnie Kind

## 2019-09-16 LAB — HEMOGLOBIN AND HEMATOCRIT, BLOOD
HCT: 28 % — ABNORMAL LOW (ref 36.0–46.0)
Hemoglobin: 8.8 g/dL — ABNORMAL LOW (ref 12.0–15.0)

## 2019-09-16 LAB — BASIC METABOLIC PANEL
Anion gap: 6 (ref 5–15)
BUN: 9 mg/dL (ref 6–20)
CO2: 24 mmol/L (ref 22–32)
Calcium: 7.8 mg/dL — ABNORMAL LOW (ref 8.9–10.3)
Chloride: 106 mmol/L (ref 98–111)
Creatinine, Ser: 0.65 mg/dL (ref 0.44–1.00)
GFR calc Af Amer: >60 mL/min (ref >60)
GFR calc non Af Amer: >60 mL/min (ref >60)
Glucose, Bld: 90 mg/dL (ref 70–99)
Potassium: 3.8 mmol/L (ref 3.5–5.1)
Sodium: 136 mmol/L (ref 135–145)

## 2019-09-16 LAB — CBC
HCT: 24.3 % — ABNORMAL LOW (ref 36.0–46.0)
Hemoglobin: 7.3 g/dL — ABNORMAL LOW (ref 12.0–15.0)
MCH: 26.3 pg (ref 26.0–34.0)
MCHC: 30 g/dL (ref 30.0–36.0)
MCV: 87.4 fL (ref 80.0–100.0)
Platelets: 256 10*3/uL (ref 150–400)
RBC: 2.78 MIL/uL — ABNORMAL LOW (ref 3.87–5.11)
RDW: 14.6 % (ref 11.5–15.5)
WBC: 7.4 10*3/uL (ref 4.0–10.5)
nRBC: 0 % (ref 0.0–0.2)

## 2019-09-16 LAB — SURGICAL PATHOLOGY

## 2019-09-16 MED ORDER — CHLORHEXIDINE GLUCONATE CLOTH 2 % EX PADS
6.0000 | MEDICATED_PAD | Freq: Every day | CUTANEOUS | Status: DC
  Administered 2019-09-16: 6 via TOPICAL

## 2019-09-16 MED ORDER — BETHANECHOL CHLORIDE 25 MG PO TABS
25.0000 mg | ORAL_TABLET | Freq: Once | ORAL | Status: DC
  Filled 2019-09-16: qty 1

## 2019-09-16 MED ORDER — SODIUM CHLORIDE 0.9% IV SOLUTION: Freq: Once | INTRAVENOUS | Status: AC

## 2019-09-16 NOTE — Progress Notes (Signed)
1 Day Post-Op Procedure(s) (LRB): HYSTERECTOMY ABDOMINAL and OPEN BILATERAL SALPINGECTOMY (N/A)  Subjective: Patient reports incisional pain, tolerating PO and + flatus.  Pain still rated a 6-7 but pt simultaneously says it has gotten a lot better and doesn't wake her from sleep.   Objective: I have reviewed patient's vital signs, medications and labs. .cbce CBC Latest Ref Rng & Units 09/16/2019 09/16/2019 09/15/2019  WBC 4.0 - 10.5 K/uL - 7.4 10.3  Hemoglobin 12.0 - 15.0 g/dL 8.8(L) 7.3(L) 8.8(L)  Hematocrit 36.0 - 46.0 % 28.0(L) 24.3(L) 28.4(L)  Platelets 150 - 400 K/uL - 256 283  has received 2 units prbc  General: alert, cooperative and no distress GI: incision: clean, dry and intact Vaginal Bleeding: minimal  Assessment: s/p Procedure(s): HYSTERECTOMY ABDOMINAL and OPEN BILATERAL SALPINGECTOMY (N/A): stable and anemia  Plan: Discontinue IV fluids probable dc in am.  LOS: 1 day    Julia Rangel 09/16/2019, 8:02 PM

## 2019-09-16 NOTE — Progress Notes (Signed)
1 Day Post-Op Procedure(s) (LRB): HYSTERECTOMY ABDOMINAL and OPEN BILATERAL SALPINGECTOMY (N/A)  Subjective: Patient reports incisional pain and tolerating PO.  Has a small bit of vaginal bleeding, no clots. Not bright bleeding.  Objective: I have reviewed patient's vital signs, intake and output and labs.  Intake/Output Summary (Last 24 hours) at 09/16/2019 0834 Last data filed at 09/16/2019 0300 Gross per 24 hour  Intake 3027.7 ml  Output 1700 ml  Net 1327.7 ml   Urine output about 800 cc /last 12 hrs.  CBC Latest Ref Rng & Units 09/16/2019 09/15/2019 09/11/2019  WBC 4.0 - 10.5 K/uL 7.4 10.3 5.0  Hemoglobin 12.0 - 15.0 g/dL 7.3(L) 8.8(L) 9.7(L)  Hematocrit 36.0 - 46.0 % 24.3(L) 28.4(L) 31.3(L)  Platelets 150 - 400 K/uL 256 283 349   CMP Latest Ref Rng & Units 09/16/2019 09/11/2019 05/23/2017  Glucose 70 - 99 mg/dL 90 71 102(H)  BUN 6 - 20 mg/dL 9 15 9   Creatinine 0.44 - 1.00 mg/dL 0.65 0.83 0.78  Sodium 135 - 145 mmol/L 136 136 135  Potassium 3.5 - 5.1 mmol/L 3.8 3.8 3.5  Chloride 98 - 111 mmol/L 106 105 102  CO2 22 - 32 mmol/L 24 24 22   Calcium 8.9 - 10.3 mg/dL 7.8(L) 8.8(L) 8.8(L)  Total Protein 6.5 - 8.1 g/dL - 7.5 7.5  Total Bilirubin 0.3 - 1.2 mg/dL - 0.3 0.4  Alkaline Phos 38 - 126 U/L - 70 83  AST 15 - 41 U/L - 18 40  ALT 0 - 44 U/L - 18 65(H)      General: alert, cooperative and no distress Resp: clear to auscultation bilaterally GI: soft, non-tender; bowel sounds normal; no masses,  no organomegaly, normal findings: bowel sounds normal and incision: clean, dry and intact Extremities: extremities normal, atraumatic, no cyanosis or edema Vaginal Bleeding: minimal  Assessment: s/p Procedure(s): HYSTERECTOMY ABDOMINAL and OPEN BILATERAL SALPINGECTOMY (N/A): stable and anemia  Acute postoperative anemia due to intraop blood loss mainly on top of chronic anemia. I cannot rule out slight postop bleeding but do not feel there is active bleeding. As the EBL was 600 cc on top  of preop anemia.  Plan: Advance diet Encourage ambulation transfuse x 2 units  LOS: 1 day    Julia Rangel 09/16/2019, 8:33 AM

## 2019-09-16 NOTE — Progress Notes (Signed)
Foley cath removed at 0520 this morning. Patient attempted to void multiple times this morning and stated she felt the urge to void. Bladder scan at 1300 revealed approximately 538mL of urine. Dr. Glo Herring made aware, foley cath ordered and to be removed 09/17/19 at 0600. Foley inserted using sterile technique and approximately 631ml returned immediately. Patient reports relief. Will continue to monitor.

## 2019-09-16 NOTE — Progress Notes (Signed)
Subjective: Patient reports unable to void, after removal of catheter but feels like she could void. Will do bladder scan and administer Bethanechol 25 mg x 1.    Objective: I have reviewed patient's vital signs, medications and labs. Pain still listed as a 7.     Assessment/Plan: Postop urinary retention.. will scan bladder, administer bethanechol x 1.   Jonnie Kind, MD    LOS: 1 day    Jonnie Kind 09/16/2019, 2:19 PM

## 2019-09-17 ENCOUNTER — Encounter: Payer: Self-pay | Admitting: Obstetrics and Gynecology

## 2019-09-17 LAB — CBC WITH DIFFERENTIAL/PLATELET
Abs Immature Granulocytes: 0.02 10*3/uL (ref 0.00–0.07)
Basophils Absolute: 0 10*3/uL (ref 0.0–0.1)
Basophils Relative: 0 %
Eosinophils Absolute: 0 10*3/uL (ref 0.0–0.5)
Eosinophils Relative: 0 %
HCT: 29.3 % — ABNORMAL LOW (ref 36.0–46.0)
Hemoglobin: 9.2 g/dL — ABNORMAL LOW (ref 12.0–15.0)
Immature Granulocytes: 0 %
Lymphocytes Relative: 15 %
Lymphs Abs: 1.1 10*3/uL (ref 0.7–4.0)
MCH: 27.9 pg (ref 26.0–34.0)
MCHC: 31.4 g/dL (ref 30.0–36.0)
MCV: 88.8 fL (ref 80.0–100.0)
Monocytes Absolute: 0.5 10*3/uL (ref 0.1–1.0)
Monocytes Relative: 7 %
Neutro Abs: 5.6 10*3/uL (ref 1.7–7.7)
Neutrophils Relative %: 78 %
Platelets: 239 10*3/uL (ref 150–400)
RBC: 3.3 MIL/uL — ABNORMAL LOW (ref 3.87–5.11)
RDW: 14.8 % (ref 11.5–15.5)
WBC: 7.2 10*3/uL (ref 4.0–10.5)
nRBC: 0 % (ref 0.0–0.2)

## 2019-09-17 LAB — TYPE AND SCREEN
ABO/RH(D): A POS
Antibody Screen: NEGATIVE
Unit division: 0
Unit division: 0

## 2019-09-17 LAB — BPAM RBC
Blood Product Expiration Date: 202106032359
Blood Product Expiration Date: 202106032359
ISSUE DATE / TIME: 202105121027
ISSUE DATE / TIME: 202105121420
Unit Type and Rh: 6200
Unit Type and Rh: 6200

## 2019-09-17 MED ORDER — SUMATRIPTAN SUCCINATE 50 MG PO TABS
100.0000 mg | ORAL_TABLET | ORAL | Status: DC | PRN
  Administered 2019-09-17 (×2): 100 mg via ORAL
  Filled 2019-09-17 (×2): qty 2

## 2019-09-17 NOTE — Progress Notes (Signed)
Foley catheter removed. Patient tolerated removal well. 368ml in foley bag.

## 2019-09-17 NOTE — Progress Notes (Signed)
Sent MD notification in regards to PCA vs po pain medication. Will reassess

## 2019-09-17 NOTE — Progress Notes (Signed)
Ambulated pt in the hall. Pt expressed pain (due to migraine), and nausea (wil give prn medication). Will reassess

## 2019-09-17 NOTE — Plan of Care (Signed)

## 2019-09-18 LAB — BASIC METABOLIC PANEL
Anion gap: 8 (ref 5–15)
BUN: 5 mg/dL — ABNORMAL LOW (ref 6–20)
CO2: 26 mmol/L (ref 22–32)
Calcium: 8.3 mg/dL — ABNORMAL LOW (ref 8.9–10.3)
Chloride: 103 mmol/L (ref 98–111)
Creatinine, Ser: 0.64 mg/dL (ref 0.44–1.00)
GFR calc Af Amer: >60 mL/min (ref >60)
GFR calc non Af Amer: >60 mL/min (ref >60)
Glucose, Bld: 115 mg/dL — ABNORMAL HIGH (ref 70–99)
Potassium: 3.7 mmol/L (ref 3.5–5.1)
Sodium: 137 mmol/L (ref 135–145)

## 2019-09-18 LAB — CBC
HCT: 29.6 % — ABNORMAL LOW (ref 36.0–46.0)
Hemoglobin: 9.1 g/dL — ABNORMAL LOW (ref 12.0–15.0)
MCH: 27.5 pg (ref 26.0–34.0)
MCHC: 30.7 g/dL (ref 30.0–36.0)
MCV: 89.4 fL (ref 80.0–100.0)
Platelets: 238 10*3/uL (ref 150–400)
RBC: 3.31 MIL/uL — ABNORMAL LOW (ref 3.87–5.11)
RDW: 15.4 % (ref 11.5–15.5)
WBC: 6.3 10*3/uL (ref 4.0–10.5)
nRBC: 0 % (ref 0.0–0.2)

## 2019-09-18 MED ORDER — ONDANSETRON HCL 4 MG PO TABS: 4.0000 mg | ORAL_TABLET | Freq: Four times a day (QID) | ORAL | 0 refills | Status: DC | PRN

## 2019-09-18 MED ORDER — BETHANECHOL CHLORIDE 25 MG PO TABS
25.0000 mg | ORAL_TABLET | Freq: Once | ORAL | Status: AC
  Administered 2019-09-18: 25 mg via ORAL
  Filled 2019-09-18: qty 1

## 2019-09-18 MED ORDER — BETHANECHOL CHLORIDE 25 MG PO TABS: 25.0000 mg | ORAL_TABLET | Freq: Two times a day (BID) | ORAL | 0 refills | Status: DC | PRN

## 2019-09-18 MED ORDER — OXYCODONE HCL 5 MG PO TABS: 5.0000 mg | ORAL_TABLET | ORAL | 0 refills | Status: DC | PRN

## 2019-09-18 NOTE — Discharge Instructions (Signed)
Dr Glo Herring may be reached here at cell [phone] over the weekend Abdominal Hysterectomy Abdominal hysterectomy is a surgical procedure to remove the womb (uterus). The uterus is the muscular organ that houses a developing baby. This surgery may be done if:  You have cancer.  You have growths (tumors or fibroids) in the uterus.  You have long-term (chronic) pain.  You are bleeding.  Your uterus has slipped down into your vagina (uterine prolapse).  You have a condition in which the tissue that lines the uterus grows outside of its normal location (endometriosis).  You have an infection in your uterus.  You are having problems with your menstrual cycle. Depending on why you are having this procedure, you may also have other reproductive organs removed. These could include:  The part of your vagina that connects with your uterus (cervix).  The organs that make eggs (ovaries).  The tubes that connect the ovaries to the uterus (fallopian tubes). Tell a health care provider about:  Any allergies you have.  All medicines you are taking, including vitamins, herbs, eye drops, creams, and over-the-counter medicines.  Any problems you or family members have had with anesthetic medicines.  Any blood disorders you have.  Any surgeries you have had.  Any medical conditions you have.  Whether you are pregnant or may be pregnant. What are the risks? Generally, this is a safe procedure. However, problems may occur, including:  Bleeding.  Infection.  Allergic reactions to medicines or dyes.  Damage to other structures or organs.  Nerve injury.  Decreased interest in sex or pain during sex.  Blood clots that can break free and travel to your lungs. What happens before the procedure? Staying hydrated Follow instructions from your health care provider about hydration, which may include:  Up to 2 hours before the procedure - you may continue to drink clear liquids, such as  water, clear fruit juice, black coffee, and plain tea Eating and drinking restrictions Follow instructions from your health care provider about eating and drinking, which may include:  8 hours before the procedure - stop eating heavy meals or foods such as meat, fried foods, or fatty foods.  6 hours before the procedure - stop eating light meals or foods, such as toast or cereal.  6 hours before the procedure - stop drinking milk or drinks that contain milk.  2 hours before the procedure - stop drinking clear liquids. Medicines  Ask your health care provider about: ? Changing or stopping your regular medicines. This is especially important if you are taking diabetes medicines or blood thinners. ? Taking medicines such as aspirin and ibuprofen. These medicines can thin your blood. Do not take these medicines before your procedure if your health care provider instructs you not to.  You may be given antibiotic medicine to help prevent infection. Take it as told by your health care provider.  You may be asked to take laxatives to prevent constipation. General instructions  Ask your health care provider how your surgical site will be marked or identified.  You may be asked to shower with a germ-killing soap.  Plan to have someone take you home from the hospital.  Do not use any products that contain nicotine or tobacco, such as cigarettes and e-cigarettes. If you need help quitting, ask your health care provider.  You may have an exam or testing.  You may have a blood or urine sample taken.  You may need to have an enema to clean out  your rectum and lower colon.  This procedure can affect the way you feel about yourself. Talk to your health care provider about the physical and emotional changes this procedure may cause. What happens during the procedure?  To lower your risk of infection: ? Your health care team will wash or sanitize their hands. ? Your skin will be washed with  soap. ? Hair may be removed from the surgical area.  An IV tube will be inserted into one of your veins.  You will be given one or more of the following: ? A medicine to help you relax (sedative). ? A medicine to make you fall asleep (general anesthetic).  Tight-fitting (compression) stockings will be placed on your legs to promote circulation.  A thin, flexible tube (catheter) will be inserted to help drain your urine.  The surgeon will make a cut (incision) through the skin in your lower belly. The incision may go side-to-side or up-and-down.  The surgeon will move aside the body tissue that covers your uterus. The surgeon will then carefully take out your uterus along with any of the other organs that need to be removed.  Bleeding will be controlled with clamps or sutures.  The surgeon will close your incision with stitches (sutures), skin glue, or adhesive strips.  A bandage (dressing) will be placed over the incision. The procedure may vary among health care providers and hospitals. What happens after the procedure?  You will be given pain medicine as needed.  Your blood pressure, heart rate, breathing rate, and blood oxygen level will be monitored until the medicines you were given have worn off.  You will need to stay in the hospital to recover for one to two days. Ask your health care provider how long you will need to stay in the hospital after your procedure.  You may have a liquid diet at first. You will most likely return to your usual diet the day after surgery.  You will still have the urinary catheter in place. It will likely be removed the day after surgery.  You may have to wear compression stockings. These stockings help to prevent blood clots and reduce swelling in your legs.  You will be encouraged to walk as soon as possible. You will also use a device or do breathing exercises to keep your lungs clear.  You may need to use a sanitary napkin for vaginal  discharge. Summary  Abdominal hysterectomy is a surgical procedure to remove the womb (uterus). The uterus is the muscular organ that houses a developing baby.  This procedure can affect the way you feel about yourself. Talk to your health care provider about the physical and emotional changes this procedure may cause.  You will be given medicines for pain after the procedure.  You will need to stay in the hospital to recover. Ask your health care provider how long you will need to stay in the hospital after your procedure. This information is not intended to replace advice given to you by your health care provider. Make sure you discuss any questions you have with your health care provider. Document Revised: 05/28/2018 Document Reviewed: 04/11/2016 Elsevier Patient Education  [place].

## 2019-09-18 NOTE — Discharge Summary (Addendum)
Physician Discharge Summary  Patient ID: Julia Rangel MRN: [id number] DOB/AGE: August 16, 1985 34 y.o.  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  Due to history of abnormal Paps cervix removal was planned.  She underwent hysterectomy with initial drop of hemoglobin of approximately 1 g despite a 600 cc EBL.  Hemoglobin equilibrated or there was some postoperative oozing and she required 2 units packed cells on postop day 1 after which hemoglobin returned at 9.8 She had slow return of bowel function and on postop day 3 he was considered stable for discharge. Additional urinary retention was initially initiated the patient had to have the catheter reinserted on postop day 1 and postop day 2.  Finally which she was able to void on postop day 3 in anterior bladder so was discharged home on Urecholine ((bethanechol) Consults: None  Significant Diagnostic Studies: labs:  CBC Latest Ref Rng & Units 09/18/2019 09/17/2019 09/16/2019  WBC 4.0 - 10.5 K/uL 6.3 7.2 -  Hemoglobin 12.0 - 15.0 g/dL 9.1(L) 9.2(L) 8.8(L)  Hematocrit 36.0 - 46.0 % 29.6(L) 29.3(L) 28.0(L)  Platelets 150 - 400 K/uL 238 239 -      Treatments: surgery: Total abdominal hysterectomy, bilateral salpingectomy, wide excision of old cicatrix  Discharge Exam: Blood pressure 113/72, pulse 82, temperature 98.4 F (36.9 C),  temperature source Oral, resp. rate 16, height '5\' 6"'  (1.676 m), weight 92.1 kg, SpO2 95 %. General appearance: alert, cooperative, and mild distress GI: soft, non-tender; bowel sounds normal; no masses,  no organomegaly Extremities: extremities normal, atraumatic, no cyanosis or edema and Homans sign is negative, no sign of DVT Incision/Wound: Dressing dry and intact no erythema seen through honeycomb dressing  Disposition: Discharge home follow-up 1 week     Follow-up Information     Jonnie Kind, MD Follow up in 1 week(s).   Specialties: Obstetrics and Gynecology, Radiology Contact information: [place] [address] [phone]            Signed: Jonnie Kind 09/18/2019, 1:25 PM

## 2019-09-18 NOTE — Progress Notes (Signed)
3 Days Post-Op Procedure(s) (LRB): HYSTERECTOMY ABDOMINAL and OPEN BILATERAL SALPINGECTOMY (N/A)  Subjective: Patient reports incisional pain, tolerating PO and + flatus.  She has turned the corner on pain, and ambulated well. Had recurrent urinary retention yesterday and had catheter reinserted with 500 cc out. Will remove again this a.m. and if unable to void by noon, will go home with catheter.  Objective: I have reviewed patient's vital signs, medications and pathology.  General: alert, cooperative and no distress Resp: clear to auscultation bilaterally GI: soft, non-tender; bowel sounds normal; no masses,  no organomegaly and incision: clean, dry and intact Extremities: extremities normal, atraumatic, no cyanosis or edema and Homans sign is negative, no sign of DVT Vaginal Bleeding: none  Assessment: s/p Procedure(s): HYSTERECTOMY ABDOMINAL and OPEN BILATERAL SALPINGECTOMY (N/A): stable and s/p urinary retention.  Plan: Discontinue IV fluids d/c foley this a.m, decide on reinsertion in 4 hours, d/c after voiding or catheter placement.  LOS: 3 days    Jonnie Kind 09/18/2019, 8:00 AM

## 2019-09-23 ENCOUNTER — Telehealth: Payer: Self-pay

## 2019-09-24 MED ORDER — OXYCODONE HCL 5 MG PO TABS: 5.0000 mg | ORAL_TABLET | ORAL | 0 refills | Status: DC | PRN

## 2019-09-28 ENCOUNTER — Ambulatory Visit (INDEPENDENT_AMBULATORY_CARE_PROVIDER_SITE_OTHER): Payer: BC Managed Care – PPO | Admitting: Obstetrics and Gynecology

## 2019-09-28 ENCOUNTER — Other Ambulatory Visit: Payer: Self-pay

## 2019-09-28 ENCOUNTER — Encounter: Payer: Self-pay | Admitting: Obstetrics and Gynecology

## 2019-09-28 VITALS — BP 122/78 | HR 79 | Wt 200.4 lb

## 2019-09-28 DIAGNOSIS — Z90710 Acquired absence of both cervix and uterus: Secondary | ICD-10-CM

## 2019-09-28 DIAGNOSIS — Z48816 Encounter for surgical aftercare following surgery on the genitourinary system: Secondary | ICD-10-CM

## 2019-09-28 DIAGNOSIS — N99840 Postprocedural hematoma of a genitourinary system organ or structure following a genitourinary system procedure: Secondary | ICD-10-CM

## 2019-09-28 DIAGNOSIS — Z9079 Acquired absence of other genital organ(s): Secondary | ICD-10-CM

## 2019-09-28 DIAGNOSIS — Z09 Encounter for follow-up examination after completed treatment for conditions other than malignant neoplasm: Secondary | ICD-10-CM

## 2019-09-28 NOTE — Patient Instructions (Signed)
If you begin feeling worse or notice any unusual smelling discharge, please reach out to [place] for some antibiotics.

## 2019-09-28 NOTE — Progress Notes (Signed)
PATIENT ID: Julia Rangel, female     DOB: 1986/02/09, 34 y.o.     MRN: [id number]  Subjective:  Julia Rangel is a 34 y.o. female now 2 weeks status post abdominal hysterectomy and open bilateral saplingectomy.with wide excision of cicatrix surgery was complicated by greater than usual amount of pain, normal electrolytes and renal function but hemoglobin drop sufficiently to require 2 units packed cells.  Intra-Op blood loss was 600 cc and preop hemoglobin in the nines. CBC Latest Ref Rng & Units 09/18/2019 09/17/2019 09/16/2019  WBC 4.0 - 10.5 K/uL 6.3 7.2 -  Hemoglobin 12.0 - 15.0 g/dL 9.1(L) 9.2(L) 8.8(L)  Hematocrit 36.0 - 46.0 % 29.6(L) 29.3(L) 28.0(L)  Platelets 150 - 400 K/uL 238 239 -     She notes that she began having some bloody discharge yesterday (09/27/2019). She also notes that she is  lactating, despite note having breast fed since 07/2017. She was given antiemetics, so medication side effect is a likely source .  She has some urinary spasm when she voids.  This is likely worsened if there is a small hematoma under the bladder flap and the vaginal cuff, as a suspect there is   Review of Systems Negative except bloody discharge, dysuria   Diet:   reg   Bowel movements : normal.  Pain is controlled without any medications.  Objective:  There were no vitals taken for this visit. General:Well developed, well nourished.  No acute distress. Abdomen: Bowel sounds normal, soft, non-tender. Pelvic Exam:    External Genitalia:  Normal.    Vagina: Normal moderated amount of nonpurulent dark blood    Cervix:surgically absent    Uterus:absent    Adnexa/Bimanual: N  Incision(s): Healing well, no drainage, no erythema, no hernia, no swelling, no dehiscence  Assessment:  Post-Op 2 weeks s/p abdominal hysterectomy and open bilateral saplingectomy  Suspected Vaginal cuff hematoma , draining per vagina without complications. stable postoperatively. Excellent abdominal healing, and vaginal  cuff hematoma seems small and reabsorbing and draining well   Plan:  1.Wound care discussed: Provided Steristrips  2. . current medications.  Unchanged 3. Activity restrictions: no sexual activity until bleeding resolves 4. return to work: 2-3 weeks. 5. Follow up in 2 weeks.   By signing my name below, I, [employer], attest that this documentation has been prepared under the direction and in the presence of Jonnie Kind, MD. Electronically Signed: [place]. 09/28/19. 8:38 AM.  I personally performed the services described in this documentation, which was SCRIBED in my presence. The recorded information has been reviewed and considered accurate. It has been edited as necessary during review. Jonnie Kind, MD

## 2019-10-13 ENCOUNTER — Encounter: Payer: Self-pay | Admitting: Obstetrics and Gynecology

## 2019-10-19 ENCOUNTER — Encounter: Payer: Self-pay | Admitting: Obstetrics and Gynecology

## 2019-10-19 ENCOUNTER — Ambulatory Visit (INDEPENDENT_AMBULATORY_CARE_PROVIDER_SITE_OTHER): Payer: BC Managed Care – PPO | Admitting: Obstetrics and Gynecology

## 2019-10-19 VITALS — BP 116/82 | HR 93 | Ht 66.0 in | Wt 199.0 lb

## 2019-10-19 DIAGNOSIS — Z90710 Acquired absence of both cervix and uterus: Secondary | ICD-10-CM

## 2019-10-19 DIAGNOSIS — D069 Carcinoma in situ of cervix, unspecified: Secondary | ICD-10-CM

## 2019-10-19 MED ORDER — SCOPOLAMINE 1 MG/3DAYS TD PT72: 1.0000 | MEDICATED_PATCH | TRANSDERMAL | 0 refills | Status: DC

## 2019-10-19 NOTE — Progress Notes (Signed)
Patient ID: Julia Rangel, female   DOB: 12/20/1985, 34 y.o.   MRN: [id number]    Subjective:  Julia Rangel is a 34 y.o. female now 4 weeks status post abdominal hysterectomy and  bilateral salpingectomy. She is very pleased with the results. She does report a slight burning sensation around the incision site. The patient has been sexually active since the procedure and reports no pain or spotting. The antibiotics she was given for her UTI were very helpful.   Review of Systems Negative   Diet: negative   Bowel movements: normal.  The patient is not having any pain.  Objective:  BP 116/82 (BP Location: Left Arm, Patient Position: Sitting, Cuff Size: Normal)   Pulse 93   Ht 5\' 6"  (1.676 m)   Wt 199 lb (90.3 kg)   LMP 07/14/2019 (Approximate)   BMI 32.12 kg/m  General:Well developed, well nourished.  No acute distress. Abdomen: Bowel sounds normal, soft, non-tender. Pelvic Exam: Vagina: long Vaginal cuff.: light pink discharge speculum check no granulation tissue visible,, cuff anterior, nontender.  Incision(s): Healing very well   Assessment:  Post-Op 4 weeks s/p abdominal hysterectomy and open bilateral salpingectomy.  Doing very well postoperatively.   Plan:  1. Wound care discussed. 2.   Current medications unchanged. 3.   Activity restrictions: none 4.   Return to work: not applicable. 5.   Follow up prn 6.   Pt was given Transderm [employer] for a trip to [place] this weekend.  By signing my name below, I, [place], attest that this documentation has been prepared under the direction and in the presence of Jonnie Kind, MD. Electronically Signed: [place], Medical Scribe. 10/19/19. 9:35 AM.  I personally performed the services described in this documentation, which was SCRIBED in my presence. The recorded information has been reviewed and considered accurate. It has been edited as necessary during review. Jonnie Kind, MD

## 2020-03-16 ENCOUNTER — Encounter (INDEPENDENT_AMBULATORY_CARE_PROVIDER_SITE_OTHER): Payer: Self-pay | Admitting: Internal Medicine

## 2020-03-16 ENCOUNTER — Ambulatory Visit (INDEPENDENT_AMBULATORY_CARE_PROVIDER_SITE_OTHER): Payer: BC Managed Care – PPO | Admitting: Internal Medicine

## 2020-03-16 ENCOUNTER — Other Ambulatory Visit: Payer: Self-pay

## 2020-03-16 VITALS — BP 134/78 | HR 81 | Temp 97.8°F | Ht 65.5 in | Wt 212.2 lb

## 2020-03-16 DIAGNOSIS — E669 Obesity, unspecified: Secondary | ICD-10-CM

## 2020-03-16 DIAGNOSIS — E782 Mixed hyperlipidemia: Secondary | ICD-10-CM

## 2020-03-16 DIAGNOSIS — E282 Polycystic ovarian syndrome: Secondary | ICD-10-CM

## 2020-03-16 DIAGNOSIS — R5381 Other malaise: Secondary | ICD-10-CM | POA: Diagnosis not present

## 2020-03-16 DIAGNOSIS — E559 Vitamin D deficiency, unspecified: Secondary | ICD-10-CM

## 2020-03-16 DIAGNOSIS — Z131 Encounter for screening for diabetes mellitus: Secondary | ICD-10-CM

## 2020-03-16 DIAGNOSIS — Z1159 Encounter for screening for other viral diseases: Secondary | ICD-10-CM

## 2020-03-16 DIAGNOSIS — R5383 Other fatigue: Secondary | ICD-10-CM

## 2020-03-16 NOTE — Progress Notes (Signed)
Metrics: Intervention Frequency ACO  Documented Smoking Status Yearly  Screened one or more times in 24 months  Cessation Counseling or  Active cessation medication Past 24 months  Past 24 months   Guideline developer: UpToDate (See UpToDate for funding source) Date Released: 2014       Wellness Office Visit  Subjective:  Patient ID: Julia Rangel, female    DOB: 05/03/1986  Age: 34 y.o. MRN: [id number]  CC: This 34 year old lady comes to our practice to establish care. HPI  She would like to become healthier, lose weight.  On closer questioning, she describes fatigue, cold intolerance, hair loss and a tendency towards constipation. She also describes acne on her face and also has a history of menorrhagia.  She has no history of miscarriages. Earlier this year she had a hysterectomy, ovaries were left in, for severe menorrhagia to require blood transfusion. Past Medical History:  Diagnosis Date  . Anemia 09/2019  . Breast tumor 2009   L breast benign  . Dysplasia of cervix   . Family history of BRCA1 gene positive   . Family history of breast cancer   . Gestational diabetes   . Kidney stone 2011  . Menorrhagia   . Migraines    since age 34 per patient  . Patient desires pregnancy 06/01/2013  . PMDD (premenstrual dysphoric disorder)    PROZAC 20 MG BUT NONE WITH PREGNANCY  . PONV (postoperative nausea and vomiting)   . Vaginal Pap smear, abnormal    Past Surgical History:  Procedure Laterality Date  . CESAREAN SECTION  2010  . CESAREAN SECTION N/A 03/29/2014   Procedure: REPEAT CESAREAN SECTION;  Surgeon: Florian Buff, MD;  Location: [facility];  Service: Obstetrics;  Laterality: N/A;  . CESAREAN SECTION WITH BILATERAL TUBAL LIGATION Bilateral 05/08/2017   Procedure: CESAREAN SECTION WITH BILATERAL TUBAL LIGATION;  Surgeon: Jonnie Kind, MD;  Location: [facility];  Service: Obstetrics;  Laterality: Bilateral;  . HYSTERECTOMY ABDOMINAL WITH SALPINGECTOMY N/A 09/15/2019    Procedure: HYSTERECTOMY ABDOMINAL and OPEN BILATERAL SALPINGECTOMY;  Surgeon: Jonnie Kind, MD;  Location: [facility];  Service: Gynecology;  Laterality: N/A;  . KIDNEY STONE SURGERY  2010   stent  . LEEP  2008  . phylloid removed  2009   left breast  . TONSILLECTOMY    . WISDOM TOOTH EXTRACTION       Family History  Problem Relation Age of Onset  . Diabetes Maternal Grandmother   . Breast cancer Maternal Grandmother        dx 40s-50  . Diabetes Paternal Grandmother   . Breast cancer Paternal Grandmother 32  . COPD Paternal Grandfather   . Heart disease Maternal Grandfather   . Breast cancer Mother 47  . BRCA 1/2 Mother        BRCA1 pos  . Ovarian cancer Mother   . Other Mother        peritoneal cancer  . Colon polyps Maternal Uncle   . SIDS Maternal Aunt        2 babies died of SIDS at 68 months    Social History   Social History Narrative   Married for 6 years.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   Social History   Tobacco Use  . Smoking status: Never Smoker  . Smokeless tobacco: Never Used  Substance Use Topics  . Alcohol use: Yes    Alcohol/week: 7.0 standard drinks    Types: 7 Glasses of  wine per week    Current Meds  Medication Sig  . EMGALITY 120 MG/ML SOSY INJECT 120MG SUBCUTANEOUSLY ONCE A MONTH. MUST BE AT ROOM TEMPERATURE 30 MINUTES PRIOR TO INJECTION  . FLUoxetine (PROZAC) 20 MG tablet Take 20 mg by mouth daily.  Marland Kitchen FLUoxetine (PROZAC) 40 MG capsule Take 1 capsule (40 mg total) by mouth daily.  . Multiple Vitamins-Minerals (MULTIVITAMIN WITH MINERALS) tablet Take 1 tablet by mouth daily.  . SUMAtriptan (IMITREX) 100 MG tablet Take 100 mg by mouth every 2 (two) hours as needed for migraine. May repeat in 2 hours if headache persists or recurs.  . [DISCONTINUED] Galcanezumab-gnlm (EMGALITY) 120 MG/ML SOAJ Inject 120 mg into the skin every 30 (thirty) days.       Depression screen [facility] 2/9 03/16/2020 10/09/2016  02/22/2016  Decreased Interest 0 0 0  Down, Depressed, Hopeless 2 0 0  PHQ - 2 Score 2 0 0  Altered sleeping 0 0 -  Tired, decreased energy 1 1 -  Change in appetite 0 0 -  Feeling bad or failure about yourself  0 0 -  Trouble concentrating 0 0 -  Moving slowly or fidgety/restless 0 0 -  Suicidal thoughts 0 0 -  PHQ-9 Score 3 1 -  Difficult doing work/chores Not difficult at all - -     Objective:   Today's Vitals: BP 134/78   Pulse 81   Temp 97.8 F (36.6 C) (Temporal)   Ht 5' 5.5" (1.664 m)   Wt 212 lb 3.2 oz (96.3 kg)   LMP 07/14/2019 (Approximate)   SpO2 98%   BMI 34.77 kg/m  Vitals with BMI 03/16/2020 10/19/2019 09/28/2019  Height 5' 5.5" 5' 6" -  Weight 212 lbs 3 oz 199 lbs 200 lbs 6 oz  BMI 34.76 09.60 45.40  Systolic 981 191 478  Diastolic 78 82 78  Pulse 81 93 79     Physical Exam  She is obese.  Blood pressure borderline elevated today.  Minimal acne on her face on initial inspection.     Assessment   1. Encounter for hepatitis C screening test for low risk patient   2. Malaise and fatigue   3. Vitamin D deficiency disease   4. PCOS (polycystic ovarian syndrome)   5. Mixed hyperlipidemia   6. Obesity (BMI 30-39.9)   7. Screening for diabetes mellitus       Tests ordered Orders Placed This Encounter  Procedures  . Hepatitis C antibody  . CBC  . COMPLETE METABOLIC PANEL WITH GFR  . Hemoglobin A1c  . Lipid panel  . T3, free  . T4  . TSH  . VITAMIN D 25 Hydroxy (Vit-D Deficiency, Fractures)  . Luteinizing hormone  . Follicle stimulating hormone  . Testos,Total,Free and SHBG (Female)     Plan: 1. Blood work is ordered. 2. I will see her in the next several weeks to discuss all results and further recommendations. 3. I spent approximately 30 min discussing her symptoms and explaining possible diagnoses.  I think she does have thyroid hypofunction.   No orders of the defined types were placed in this encounter.   Doree Albee,  MD

## 2020-03-20 LAB — CBC
HCT: 38.2 % (ref 35.0–45.0)
Hemoglobin: 13 g/dL (ref 11.7–15.5)
MCH: 30.3 pg (ref 27.0–33.0)
MCHC: 34 g/dL (ref 32.0–36.0)
MCV: 89 fL (ref 80.0–100.0)
MPV: 10.7 fL (ref 7.5–12.5)
Platelets: 302 10*3/uL (ref 140–400)
RBC: 4.29 10*6/uL (ref 3.80–5.10)
RDW: 12.7 % (ref 11.0–15.0)
WBC: 7.4 10*3/uL (ref 3.8–10.8)

## 2020-03-20 LAB — COMPLETE METABOLIC PANEL WITH GFR
AG Ratio: 1.5 (calc) (ref 1.0–2.5)
ALT: 17 U/L (ref 6–29)
AST: 16 U/L (ref 10–30)
Albumin: 4.7 g/dL (ref 3.6–5.1)
Alkaline phosphatase (APISO): 73 U/L (ref 31–125)
BUN: 14 mg/dL (ref 7–25)
CO2: 27 mmol/L (ref 20–32)
Calcium: 9.8 mg/dL (ref 8.6–10.2)
Chloride: 101 mmol/L (ref 98–110)
Creat: 0.75 mg/dL (ref 0.50–1.10)
GFR, Est African American: 120 mL/min/{1.73_m2} (ref >=60)
GFR, Est Non African American: 103 mL/min/{1.73_m2} (ref >=60)
Globulin: 3.2 g/dL (calc) (ref 1.9–3.7)
Glucose, Bld: 86 mg/dL (ref 65–99)
Potassium: 3.8 mmol/L (ref 3.5–5.3)
Sodium: 138 mmol/L (ref 135–146)
Total Bilirubin: 0.5 mg/dL (ref 0.2–1.2)
Total Protein: 7.9 g/dL (ref 6.1–8.1)

## 2020-03-20 LAB — LIPID PANEL
Cholesterol: 234 mg/dL — ABNORMAL HIGH (ref <200)
HDL: 74 mg/dL (ref >=50)
LDL Cholesterol (Calc): 139 mg/dL (calc) — ABNORMAL HIGH
Non-HDL Cholesterol (Calc): 160 mg/dL (calc) — ABNORMAL HIGH (ref <130)
Total CHOL/HDL Ratio: 3.2 (calc) (ref <5.0)
Triglycerides: 100 mg/dL (ref <150)

## 2020-03-20 LAB — VITAMIN D 25 HYDROXY (VIT D DEFICIENCY, FRACTURES): Vit D, 25-Hydroxy: 23 ng/mL — ABNORMAL LOW (ref 30–100)

## 2020-03-20 LAB — FOLLICLE STIMULATING HORMONE: FSH: 20.5 m[IU]/mL

## 2020-03-20 LAB — T4: T4, Total: 7.9 ug/dL (ref 5.1–11.9)

## 2020-03-20 LAB — TESTOS,TOTAL,FREE AND SHBG (FEMALE)
Free Testosterone: 3.5 pg/mL (ref 0.1–6.4)
Sex Hormone Binding: 74 nmol/L (ref 17–124)
Testosterone, Total, LC-MS-MS: 45 ng/dL (ref 2–45)

## 2020-03-20 LAB — HEPATITIS C ANTIBODY
Hepatitis C Ab: NONREACTIVE
SIGNAL TO CUT-OFF: 0.01 (ref <1.00)

## 2020-03-20 LAB — HEMOGLOBIN A1C
Hgb A1c MFr Bld: 5 % of total Hgb (ref <5.7)
Mean Plasma Glucose: 97 (calc)
eAG (mmol/L): 5.4 (calc)

## 2020-03-20 LAB — LUTEINIZING HORMONE: LH: 60 m[IU]/mL

## 2020-03-20 LAB — T3, FREE: T3, Free: 2.7 pg/mL (ref 2.3–4.2)

## 2020-03-20 LAB — TSH: TSH: 2.22 mIU/L

## 2020-04-15 ENCOUNTER — Other Ambulatory Visit: Payer: Self-pay | Admitting: Advanced Practice Midwife

## 2020-04-27 ENCOUNTER — Ambulatory Visit (INDEPENDENT_AMBULATORY_CARE_PROVIDER_SITE_OTHER): Payer: BC Managed Care – PPO | Admitting: Internal Medicine

## 2020-04-27 ENCOUNTER — Encounter (INDEPENDENT_AMBULATORY_CARE_PROVIDER_SITE_OTHER): Payer: Self-pay | Admitting: Internal Medicine

## 2020-05-26 ENCOUNTER — Other Ambulatory Visit: Payer: Self-pay | Admitting: Advanced Practice Midwife

## 2020-05-30 ENCOUNTER — Other Ambulatory Visit: Payer: Self-pay

## 2020-05-30 MED ORDER — FLUOXETINE HCL 40 MG PO CAPS: 40.0000 mg | ORAL_CAPSULE | Freq: Every day | ORAL | 6 refills | Status: DC

## 2020-06-06 ENCOUNTER — Ambulatory Visit (INDEPENDENT_AMBULATORY_CARE_PROVIDER_SITE_OTHER): Payer: BC Managed Care – PPO | Admitting: Internal Medicine

## 2020-06-06 ENCOUNTER — Encounter (INDEPENDENT_AMBULATORY_CARE_PROVIDER_SITE_OTHER): Payer: Self-pay | Admitting: Internal Medicine

## 2020-06-06 ENCOUNTER — Other Ambulatory Visit: Payer: Self-pay

## 2020-06-06 VITALS — BP 132/80 | HR 86 | Temp 97.5°F | Resp 18 | Ht 65.0 in | Wt 215.0 lb

## 2020-06-06 DIAGNOSIS — E282 Polycystic ovarian syndrome: Secondary | ICD-10-CM | POA: Diagnosis not present

## 2020-06-06 DIAGNOSIS — E782 Mixed hyperlipidemia: Secondary | ICD-10-CM

## 2020-06-06 DIAGNOSIS — E559 Vitamin D deficiency, unspecified: Secondary | ICD-10-CM

## 2020-06-06 DIAGNOSIS — E669 Obesity, unspecified: Secondary | ICD-10-CM | POA: Diagnosis not present

## 2020-06-06 MED ORDER — NP THYROID 60 MG PO TABS
60.0000 mg | ORAL_TABLET | Freq: Every day | ORAL | 3 refills | Status: DC
Start: 2020-06-06 — End: 2020-07-19

## 2020-06-06 NOTE — Progress Notes (Signed)
Metrics: Intervention Frequency ACO  Documented Smoking Status Yearly  Screened one or more times in 24 months  Cessation Counseling or  Active cessation medication Past 24 months  Past 24 months   Guideline developer: UpToDate (See UpToDate for funding source) Date Released: 2014       Wellness Office Visit  Subjective:  Patient ID: Julia Rangel, female    DOB: 1985/08/06  Age: 35 y.o. MRN: [id number]  CC: This lady comes in to review her blood work and further recommendations. HPI  I discussed all the blood results in detail.  She has vitamin D deficiency which is quite severe. She has suboptimal free T3 levels and symptoms consistent with thyroid hypofunction. She has classical/pathopneumonic FSH/LH ratio consistent with PCOS.  The ratio of FSH/LH is 1:3. Past Medical History:  Diagnosis Date  . Anemia 09/2019  . Breast tumor 2009   L breast benign  . Dysplasia of cervix   . Family history of BRCA1 gene positive   . Family history of breast cancer   . Gestational diabetes   . Kidney stone 2011  . Menorrhagia   . Migraines    since age 64 per patient  . Patient desires pregnancy 06/01/2013  . PMDD (premenstrual dysphoric disorder)    PROZAC 20 MG BUT NONE WITH PREGNANCY  . PONV (postoperative nausea and vomiting)   . Vaginal Pap smear, abnormal    Past Surgical History:  Procedure Laterality Date  . CESAREAN SECTION  2010  . CESAREAN SECTION N/A 03/29/2014   Procedure: REPEAT CESAREAN SECTION;  Surgeon: Florian Buff, MD;  Location: [facility];  Service: Obstetrics;  Laterality: N/A;  . CESAREAN SECTION WITH BILATERAL TUBAL LIGATION Bilateral 05/08/2017   Procedure: CESAREAN SECTION WITH BILATERAL TUBAL LIGATION;  Surgeon: Jonnie Kind, MD;  Location: [place];  Service: Obstetrics;  Laterality: Bilateral;  . HYSTERECTOMY ABDOMINAL WITH SALPINGECTOMY N/A 09/15/2019   Procedure: HYSTERECTOMY ABDOMINAL and OPEN BILATERAL SALPINGECTOMY;  Surgeon: Jonnie Kind,  MD;  Location: [facility];  Service: Gynecology;  Laterality: N/A;  . KIDNEY STONE SURGERY  2010   stent  . LEEP  2008  . phylloid removed  2009   left breast  . TONSILLECTOMY    . WISDOM TOOTH EXTRACTION       Family History  Problem Relation Age of Onset  . Diabetes Maternal Grandmother   . Breast cancer Maternal Grandmother        dx 40s-50  . Diabetes Paternal Grandmother   . Breast cancer Paternal Grandmother 37  . COPD Paternal Grandfather   . Heart disease Maternal Grandfather   . Breast cancer Mother 76  . BRCA 1/2 Mother        BRCA1 pos  . Ovarian cancer Mother   . Other Mother        peritoneal cancer  . Colon polyps Maternal Uncle   . 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   Social History   Tobacco Use  . Smoking status: Never Smoker  . Smokeless tobacco: Never Used  Substance Use Topics  . Alcohol use: Yes    Alcohol/week: 7.0 standard drinks    Types: 7 Glasses of wine per week    Current Meds  Medication Sig  . EMGALITY 120 MG/ML  SOSY INJECT 120MG SUBCUTANEOUSLY ONCE A MONTH. MUST BE AT ROOM TEMPERATURE 30 MINUTES PRIOR TO INJECTION  . FLUoxetine (PROZAC) 20 MG tablet Take 1 tablet by mouth once daily  . FLUoxetine (PROZAC) 40 MG capsule Take 1 capsule (40 mg total) by mouth daily.  . Multiple Vitamins-Minerals (MULTIVITAMIN WITH MINERALS) tablet Take 1 tablet by mouth daily.  . NP THYROID 60 MG tablet Take 1 tablet (60 mg total) by mouth daily before breakfast.  . SUMAtriptan (IMITREX) 100 MG tablet Take 100 mg by mouth every 2 (two) hours as needed for migraine. May repeat in 2 hours if headache persists or recurs.      Depression screen [facility] 2/9 03/16/2020 10/09/2016 02/22/2016  Decreased Interest 0 0 0  Down, Depressed, Hopeless 2 0 0  PHQ - 2 Score 2 0 0  Altered sleeping 0 0 -   Tired, decreased energy 1 1 -  Change in appetite 0 0 -  Feeling bad or failure about yourself  0 0 -  Trouble concentrating 0 0 -  Moving slowly or fidgety/restless 0 0 -  Suicidal thoughts 0 0 -  PHQ-9 Score 3 1 -  Difficult doing work/chores Not difficult at all - -     Objective:   Today's Vitals: BP 132/80 (BP Location: Right Arm, Patient Position: Sitting, Cuff Size: Normal)   Pulse 86   Temp (!) 97.5 F (36.4 C) (Temporal)   Resp 18   Ht '5\' 5"'  (1.651 m)   Wt 215 lb (97.5 kg)   LMP 07/14/2019 (Approximate)   SpO2 98%   BMI 35.78 kg/m  Vitals with BMI 06/06/2020 03/16/2020 10/19/2019  Height '5\' 5"'  5' 5.5" '5\' 6"'   Weight 215 lbs 212 lbs 3 oz 199 lbs  BMI 35.78 65.79 03.83  Systolic 338 329 191  Diastolic 80 78 82  Pulse 86 81 93     Physical Exam  She remains obese and has gained 3 pounds in weight since last visit.     Assessment   1. PCOS (polycystic ovarian syndrome)   2. Obesity (BMI 30-39.9)   3. Mixed hyperlipidemia   4. Vitamin D deficiency disease       Tests ordered No orders of the defined types were placed in this encounter.    Plan: 1. I recommend that she start taking vitamin D3 10,000 units daily. 2. After shared decision making and explanation of her thyroid function test, she agreed to start NP thyroid, off label, for symptoms of thyroid hypofunction.  I have told her of possible side effects and how to deal with them. 3. I explained to her what PCOS is and the importance of insulin resistance and lowering this by reducing visceral fat.  We discussed intermittent fasting combined with a plant-based diet.  I emphasized the importance of good hydration with this. 4. I will see her in about 6 weeks time to see how she is doing and we may do further blood work including progesterone levels.   Meds ordered this encounter  Medications  . NP THYROID 60 MG tablet    Sig: Take 1 tablet (60 mg total) by mouth daily before breakfast.     Dispense:  30 tablet    Refill:  3    Dasan Hardman Luther Parody, MD

## 2020-06-06 NOTE — Patient Instructions (Addendum)
VITAMIN D3 10,000 UNITS/DAY 

## 2020-07-18 ENCOUNTER — Encounter (INDEPENDENT_AMBULATORY_CARE_PROVIDER_SITE_OTHER): Payer: Self-pay | Admitting: Internal Medicine

## 2020-07-18 ENCOUNTER — Ambulatory Visit (INDEPENDENT_AMBULATORY_CARE_PROVIDER_SITE_OTHER): Payer: BC Managed Care – PPO | Admitting: Internal Medicine

## 2020-07-18 ENCOUNTER — Other Ambulatory Visit: Payer: Self-pay

## 2020-07-18 VITALS — BP 108/74 | HR 82 | Temp 97.7°F | Ht 65.0 in | Wt 207.2 lb

## 2020-07-18 DIAGNOSIS — E669 Obesity, unspecified: Secondary | ICD-10-CM | POA: Diagnosis not present

## 2020-07-18 DIAGNOSIS — E559 Vitamin D deficiency, unspecified: Secondary | ICD-10-CM | POA: Diagnosis not present

## 2020-07-18 DIAGNOSIS — E782 Mixed hyperlipidemia: Secondary | ICD-10-CM

## 2020-07-18 DIAGNOSIS — E282 Polycystic ovarian syndrome: Secondary | ICD-10-CM

## 2020-07-18 NOTE — Progress Notes (Signed)
Metrics: Intervention Frequency ACO  Documented Smoking Status Yearly  Screened one or more times in 24 months  Cessation Counseling or  Active cessation medication Past 24 months  Past 24 months   Guideline developer: UpToDate (See UpToDate for funding source) Date Released: 2014       Wellness Office Visit  Subjective:  Patient ID: Julia Rangel, female    DOB: [birth year]  Age: 35 y.o. MRN: [id number]  CC: This lady comes in for follow-up of PCOS, obesity, dyslipidemia and vitamin D deficiency. HPI  She feels much improved with her symptoms since she started taking NP thyroid. She is also taking vitamin D3 10,000 units daily consistently. She is also changing her nutrition and is doing intermittent fasting combined more with a plant-based diet. She is somewhat frustrated that she has not lost more weight than she actually has. She is not exercising on a regular basis yet. Past Medical History:  Diagnosis Date  . Anemia 09/2019  . Breast tumor 2009   L breast benign  . Dysplasia of cervix   . Family history of BRCA1 gene positive   . Family history of breast cancer   . Gestational diabetes   . Kidney stone 2011  . Menorrhagia   . Migraines    since age 17 per patient  . Patient desires pregnancy 06/01/2013  . PMDD (premenstrual dysphoric disorder)    PROZAC 20 MG BUT NONE WITH PREGNANCY  . PONV (postoperative nausea and vomiting)   . Vaginal Pap smear, abnormal    Past Surgical History:  Procedure Laterality Date  . CESAREAN SECTION  2010  . CESAREAN SECTION N/A 03/29/2014   Procedure: REPEAT CESAREAN SECTION;  Surgeon: Florian Buff, MD;  Location: [facility];  Service: Obstetrics;  Laterality: N/A;  . CESAREAN SECTION WITH BILATERAL TUBAL LIGATION Bilateral 05/08/2017   Procedure: CESAREAN SECTION WITH BILATERAL TUBAL LIGATION;  Surgeon: Jonnie Kind, MD;  Location: [place];  Service: Obstetrics;  Laterality: Bilateral;  . HYSTERECTOMY ABDOMINAL WITH  SALPINGECTOMY N/A 09/15/2019   Procedure: HYSTERECTOMY ABDOMINAL and OPEN BILATERAL SALPINGECTOMY;  Surgeon: Jonnie Kind, MD;  Location: [facility];  Service: Gynecology;  Laterality: N/A;  . KIDNEY STONE SURGERY  2010   stent  . LEEP  2008  . phylloid removed  2009   left breast  . TONSILLECTOMY    . WISDOM TOOTH EXTRACTION       Family History  Problem Relation Age of Onset  . Diabetes Maternal Grandmother   . Breast cancer Maternal Grandmother        dx 40s-50  . Diabetes Paternal Grandmother   . Breast cancer Paternal Grandmother 42  . COPD Paternal Grandfather   . Heart disease Maternal Grandfather   . Breast cancer Mother 70  . BRCA 1/2 Mother        BRCA1 pos  . Ovarian cancer Mother   . Other Mother        peritoneal cancer  . Colon polyps Maternal Uncle   . 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   Social History   Tobacco Use  . Smoking status: Never Smoker  . Smokeless tobacco: Never Used  Substance Use Topics  . Alcohol use: Yes    Alcohol/week: 7.0 standard drinks  Types: 7 Glasses of wine per week    Current Meds  Medication Sig  . EMGALITY 120 MG/ML SOSY INJECT 120MG SUBCUTANEOUSLY ONCE A MONTH. MUST BE AT ROOM TEMPERATURE 30 MINUTES PRIOR TO INJECTION  . FLUoxetine (PROZAC) 20 MG tablet Take 1 tablet by mouth once daily  . FLUoxetine (PROZAC) 40 MG capsule Take 1 capsule (40 mg total) by mouth daily.  . Multiple Vitamins-Minerals (MULTIVITAMIN WITH MINERALS) tablet Take 1 tablet by mouth daily.  . NP THYROID 60 MG tablet Take 1 tablet (60 mg total) by mouth daily before breakfast.  . SUMAtriptan (IMITREX) 100 MG tablet Take 100 mg by mouth every 2 (two) hours as needed for migraine. May repeat in 2 hours if headache persists or recurs.     [facility] Office Visit from 07/18/2020 in  [facility]  PHQ-9 Total Score 5      Objective:   Today's Vitals: BP 108/74   Pulse 82   Temp 97.7 F (36.5 C) (Temporal)   Ht _0  (1.651 m)   Wt 207 lb 3.2 oz (94 kg)   LMP 07/14/2019 (Approximate)   SpO2 99%   BMI 34.48 kg/m  Vitals with BMI 07/18/2020 06/06/2020 03/16/2020  Height _1  _2  5' 5.5"  Weight 207 lbs 3 oz 215 lbs 212 lbs 3 oz  BMI 34.48 65.03 54.65  Systolic 681 275 170  Diastolic 74 80 78  Pulse 82 86 81     Physical Exam  She looks systemically well, remains obese but has lost 8 pounds since the last time I saw her which is great.  Blood pressure is much improved also.     Assessment   1. PCOS (polycystic ovarian syndrome)   2. Obesity (BMI 30-39.9)   3. Mixed hyperlipidemia   4. Vitamin D deficiency disease       Tests ordered Orders Placed This Encounter  Procedures  . T3, free  . TSH  . VITAMIN D 25 Hydroxy (Vit-D Deficiency, Fractures)     Plan: 1. She will continue with nutrition plan that we have discussed before. 2. She will continue with NP thyroid 60 mg daily and we will check thyroid function today to see if we need to optimize further, I suspect we will. 3. She will continue with vitamin D3 10,000 units daily and we will check levels today. 4. I also encouraged her to start exercising on a more regular basis, even going for brisk walks every day for even 15 minutes would be helpful. 5. Follow-up in about 6 weeks.   No orders of the defined types were placed in this encounter.   Doree Albee, MD

## 2020-07-19 ENCOUNTER — Other Ambulatory Visit (INDEPENDENT_AMBULATORY_CARE_PROVIDER_SITE_OTHER): Payer: Self-pay | Admitting: Internal Medicine

## 2020-07-19 LAB — VITAMIN D 25 HYDROXY (VIT D DEFICIENCY, FRACTURES): Vit D, 25-Hydroxy: 49 ng/mL (ref 30–100)

## 2020-07-19 LAB — TSH: TSH: 0.21 mIU/L — ABNORMAL LOW

## 2020-07-19 LAB — T3, FREE: T3, Free: 3.3 pg/mL (ref 2.3–4.2)

## 2020-07-19 MED ORDER — NP THYROID 90 MG PO TABS: 90.0000 mg | ORAL_TABLET | Freq: Every day | ORAL | 3 refills | Status: DC

## 2020-08-30 ENCOUNTER — Other Ambulatory Visit: Payer: Self-pay

## 2020-08-30 ENCOUNTER — Ambulatory Visit (INDEPENDENT_AMBULATORY_CARE_PROVIDER_SITE_OTHER): Payer: BC Managed Care – PPO | Admitting: Internal Medicine

## 2020-08-30 ENCOUNTER — Encounter (INDEPENDENT_AMBULATORY_CARE_PROVIDER_SITE_OTHER): Payer: Self-pay | Admitting: Internal Medicine

## 2020-08-30 VITALS — BP 112/76 | HR 75 | Temp 97.7°F | Ht 65.0 in | Wt 207.8 lb

## 2020-08-30 DIAGNOSIS — E282 Polycystic ovarian syndrome: Secondary | ICD-10-CM

## 2020-08-30 DIAGNOSIS — R5381 Other malaise: Secondary | ICD-10-CM

## 2020-08-30 DIAGNOSIS — R5383 Other fatigue: Secondary | ICD-10-CM | POA: Diagnosis not present

## 2020-08-30 MED ORDER — NP THYROID 120 MG PO TABS: 120.0000 mg | ORAL_TABLET | Freq: Every day | ORAL | 3 refills | Status: DC

## 2020-08-30 MED ORDER — RYBELSUS 3 MG PO TABS: 3.0000 mg | ORAL_TABLET | Freq: Every day | ORAL | 3 refills | Status: DC

## 2020-08-30 NOTE — Progress Notes (Signed)
Metrics: Intervention Frequency ACO  Documented Smoking Status Yearly  Screened one or more times in 24 months  Cessation Counseling or  Active cessation medication Past 24 months  Past 24 months   Guideline developer: UpToDate (See UpToDate for funding source) Date Released: 2014       Wellness Office Visit  Subjective:  Patient ID: Julia Rangel, female    DOB: January 08, 1986  Age: 35 y.o. MRN: [id number]  CC: This lady comes in for follow-up of therapy for PCOS. HPI  She tolerated the higher dose of NP thyroid.  She also took a trip to [place] and unfortunately gained 5 pounds that she had lost prior to this trip.  She is determined to get back on track. She was asking about any medications for obesity in particular. Past Medical History:  Diagnosis Date  . Anemia 09/2019  . Breast tumor 2009   L breast benign  . Dysplasia of cervix   . Family history of BRCA1 gene positive   . Family history of breast cancer   . Gestational diabetes   . Kidney stone 2011  . Menorrhagia   . Migraines    since age 64 per patient  . Patient desires pregnancy 06/01/2013  . PMDD (premenstrual dysphoric disorder)    PROZAC 20 MG BUT NONE WITH PREGNANCY  . PONV (postoperative nausea and vomiting)   . Vaginal Pap smear, abnormal    Past Surgical History:  Procedure Laterality Date  . CESAREAN SECTION  2010  . CESAREAN SECTION N/A 03/29/2014   Procedure: REPEAT CESAREAN SECTION;  Surgeon: Florian Buff, MD;  Location: [facility];  Service: Obstetrics;  Laterality: N/A;  . CESAREAN SECTION WITH BILATERAL TUBAL LIGATION Bilateral 05/08/2017   Procedure: CESAREAN SECTION WITH BILATERAL TUBAL LIGATION;  Surgeon: Jonnie Kind, MD;  Location: [facility];  Service: Obstetrics;  Laterality: Bilateral;  . HYSTERECTOMY ABDOMINAL WITH SALPINGECTOMY N/A 09/15/2019   Procedure: HYSTERECTOMY ABDOMINAL and OPEN BILATERAL SALPINGECTOMY;  Surgeon: Jonnie Kind, MD;  Location: [facility];  Service:  Gynecology;  Laterality: N/A;  . KIDNEY STONE SURGERY  2010   stent  . LEEP  2008  . phylloid removed  2009   left breast  . TONSILLECTOMY    . WISDOM TOOTH EXTRACTION       Family History  Problem Relation Age of Onset  . Diabetes Maternal Grandmother   . Breast cancer Maternal Grandmother        dx 40s-50  . Diabetes Paternal Grandmother   . Breast cancer Paternal Grandmother 83  . COPD Paternal Grandfather   . Heart disease Maternal Grandfather   . Breast cancer Mother 72  . BRCA 1/2 Mother        BRCA1 pos  . Ovarian cancer Mother   . Other Mother        peritoneal cancer  . Colon polyps Maternal Uncle   . SIDS Maternal Aunt        2 babies died of SIDS at 42 months    Social History   Social History Narrative   Married for 6 years.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   Social History   Tobacco Use  . Smoking status: Never Smoker  . Smokeless tobacco: Never Used  Substance Use Topics  . Alcohol use: Yes    Alcohol/week: 7.0 standard drinks    Types: 7 Glasses of wine per week    Current Meds  Medication Sig  . EMGALITY  120 MG/ML SOSY INJECT 120MG SUBCUTANEOUSLY ONCE A MONTH. MUST BE AT ROOM TEMPERATURE 30 MINUTES PRIOR TO INJECTION  . FLUoxetine (PROZAC) 20 MG tablet Take 1 tablet by mouth once daily  . FLUoxetine (PROZAC) 40 MG capsule Take 1 capsule (40 mg total) by mouth daily.  . Multiple Vitamins-Minerals (MULTIVITAMIN WITH MINERALS) tablet Take 1 tablet by mouth daily.  . NP THYROID 120 MG tablet Take 1 tablet (120 mg total) by mouth daily before breakfast.  . Semaglutide (RYBELSUS) 3 MG TABS Take 3 mg by mouth daily.  . SUMAtriptan (IMITREX) 100 MG tablet Take 100 mg by mouth every 2 (two) hours as needed for migraine. May repeat in 2 hours if headache persists or recurs.  . [DISCONTINUED] NP THYROID 90 MG tablet Take 1 tablet (90 mg total) by mouth daily.     [place] Office Visit from 07/18/2020 in [facility]  PHQ-9 Total Score 5      Objective:   Today's Vitals: BP 112/76   Pulse 75   Temp 97.7 F (36.5 C) (Temporal)   Ht 5' 5" (1.651 m)   Wt 207 lb 12.8 oz (94.3 kg)   LMP 07/14/2019 (Approximate)   SpO2 97%   BMI 34.58 kg/m  Vitals with BMI 08/30/2020 07/18/2020 06/06/2020  Height 5' 5" 5' 5" 5' 5"  Weight 207 lbs 13 oz 207 lbs 3 oz 215 lbs  BMI 34.58 82.50 03.70  Systolic 488 891 694  Diastolic 76 74 80  Pulse 75 82 86     Physical Exam  She remains obese and has not lost any weight according to our scales compared to the last visit which would make sense.     Assessment   1. PCOS (polycystic ovarian syndrome)   2. Malaise and fatigue       Tests ordered No orders of the defined types were placed in this encounter.    Plan: 1. She will continue to be consistent with nutrition again and I am going to further optimize her thyroid so I have sent a new prescription of NP thyroid 120 mg daily. 2. We also discussed GLP-1 agonist as a means of reducing insulin resistance and she is amenable to trying this.  I will start her on Rybelsus 3 mg daily. 3. We will discuss testosterone therapy in the future also. 4. Follow-up in about 6 weeks   Meds ordered this encounter  Medications  . NP THYROID 120 MG tablet    Sig: Take 1 tablet (120 mg total) by mouth daily before breakfast.    Dispense:  30 tablet    Refill:  3  . Semaglutide (RYBELSUS) 3 MG TABS    Sig: Take 3 mg by mouth daily.    Dispense:  30 tablet    Refill:  3     Luther Parody, MD

## 2020-09-15 ENCOUNTER — Encounter (INDEPENDENT_AMBULATORY_CARE_PROVIDER_SITE_OTHER): Payer: Self-pay | Admitting: Internal Medicine

## 2020-10-11 ENCOUNTER — Ambulatory Visit: Payer: BC Managed Care – PPO | Admitting: Adult Health

## 2020-10-13 ENCOUNTER — Other Ambulatory Visit: Payer: Self-pay

## 2020-10-13 ENCOUNTER — Encounter (INDEPENDENT_AMBULATORY_CARE_PROVIDER_SITE_OTHER): Payer: Self-pay | Admitting: Internal Medicine

## 2020-10-13 ENCOUNTER — Ambulatory Visit (INDEPENDENT_AMBULATORY_CARE_PROVIDER_SITE_OTHER): Payer: BC Managed Care – PPO | Admitting: Internal Medicine

## 2020-10-13 VITALS — BP 121/80 | HR 85 | Temp 97.8°F | Resp 18 | Ht 65.0 in | Wt 205.0 lb

## 2020-10-13 DIAGNOSIS — R5383 Other fatigue: Secondary | ICD-10-CM | POA: Diagnosis not present

## 2020-10-13 DIAGNOSIS — E282 Polycystic ovarian syndrome: Secondary | ICD-10-CM

## 2020-10-13 DIAGNOSIS — E669 Obesity, unspecified: Secondary | ICD-10-CM

## 2020-10-13 DIAGNOSIS — R5381 Other malaise: Secondary | ICD-10-CM | POA: Diagnosis not present

## 2020-10-13 MED ORDER — TRULICITY 0.75 MG/0.5ML ~~LOC~~ SOAJ
0.7500 mg | SUBCUTANEOUS | 3 refills | Status: DC
Start: 2020-10-13 — End: 2021-02-09

## 2020-10-13 NOTE — Progress Notes (Signed)
Metrics: Intervention Frequency ACO  Documented Smoking Status Yearly  Screened one or more times in 24 months  Cessation Counseling or  Active cessation medication Past 24 months  Past 24 months   Guideline developer: UpToDate (See UpToDate for funding source) Date Released: 2014       Wellness Office Visit  Subjective:  Patient ID: Julia Rangel, female    DOB: 12/30/1985  Age: 35 y.o. MRN: [id number]  CC: This lady comes in for follow-up of PCOS. HPI  She tolerated higher dose of NP thyroid 120 mg daily but felt that Rybelsus made her blood sugar levels fall.  She also admits that she was on more of a calorie restricted diet at the time also. 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   Dysplasia of cervix    Family history of BRCA1 gene positive    Family history of breast cancer    Gestational diabetes    Kidney stone 2011   Menorrhagia    Migraines    since age 18 per patient   Patient desires pregnancy 06/01/2013   PMDD (premenstrual dysphoric disorder)    PROZAC 20 MG BUT NONE WITH PREGNANCY   PONV (postoperative nausea and vomiting)    Vaginal Pap smear, abnormal    Past Surgical History:  Procedure Laterality Date   CESAREAN SECTION  2010   CESAREAN SECTION N/A 03/29/2014   Procedure: REPEAT CESAREAN SECTION;  Surgeon: Luther H Eure, MD;  Location: [facility];  Service: Obstetrics;  Laterality: N/A;   CESAREAN SECTION WITH BILATERAL TUBAL LIGATION Bilateral 05/08/2017   Procedure: CESAREAN SECTION WITH BILATERAL TUBAL LIGATION;  Surgeon: Ferguson, John V, MD;  Location: [facility];  Service: Obstetrics;  Laterality: Bilateral;   HYSTERECTOMY ABDOMINAL WITH SALPINGECTOMY N/A 09/15/2019   Procedure: HYSTERECTOMY ABDOMINAL and OPEN BILATERAL SALPINGECTOMY;  Surgeon: Ferguson, John V, MD;  Location: [facility];  Service: Gynecology;  Laterality: N/A;   KIDNEY STONE SURGERY  2010   stent   LEEP  2008   phylloid removed   2009   left breast   TONSILLECTOMY     WISDOM TOOTH EXTRACTION       Family History  Problem Relation Age of Onset   Diabetes Maternal Grandmother    Breast cancer Maternal Grandmother        dx 40s-50   Diabetes Paternal Grandmother    Breast cancer Paternal Grandmother 69   COPD Paternal Grandfather    Heart disease Maternal Grandfather    Breast cancer Mother 58   BRCA 1/2 Mother        BRCA1 pos   Ovarian cancer Mother    Other Mother        peritoneal cancer   Colon polyps Maternal Uncle    SIDS Maternal Aunt        2 babies died of SIDS at 4 months    Social History   Social History Narrative   Married for 6 years.Lives with husband and 3 kids.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   Social History   Tobacco Use   Smoking status: Never   Smokeless tobacco: Never  Substance Use Topics   Alcohol use: Yes    Alcohol/week: 7.0 standard drinks    Types: 7 Glasses of wine per week    Current Meds  Medication Sig   Dulaglutide (TRULICITY) 0.75 MG/0.5ML SOPN Inject 0.75 mg into the skin once a week.     EMGALITY 120 MG/ML SOSY INJECT 120MG SUBCUTANEOUSLY ONCE A MONTH. MUST BE AT ROOM TEMPERATURE 30 MINUTES PRIOR TO INJECTION   FLUoxetine (PROZAC) 20 MG tablet Take 1 tablet by mouth once daily   FLUoxetine (PROZAC) 40 MG capsule Take 1 capsule (40 mg total) by mouth daily.   Multiple Vitamins-Minerals (MULTIVITAMIN WITH MINERALS) tablet Take 1 tablet by mouth daily.   NP THYROID 120 MG tablet Take 1 tablet (120 mg total) by mouth daily before breakfast.   SUMAtriptan (IMITREX) 100 MG tablet Take 100 mg by mouth every 2 (two) hours as needed for migraine. May repeat in 2 hours if headache persists or recurs.     [facility] Office Visit from 10/13/2020 in [facility]  PHQ-9 Total Score 1       Objective:   Today's Vitals: BP 121/80 (BP Location: Right Arm, Patient Position: Sitting, Cuff Size: Normal)   Pulse 85   Temp 97.8 F (36.6 C)  (Temporal)   Resp 18   Ht 5' 5" (1.651 m)   Wt 205 lb (93 kg)   LMP 07/14/2019 (Approximate)   SpO2 98%   BMI 34.11 kg/m  Vitals with BMI 10/13/2020 08/30/2020 07/18/2020  Height 5' 5" 5' 5" 5' 5"  Weight 205 lbs 207 lbs 13 oz 207 lbs 3 oz  BMI 34.11 57.32 20.25  Systolic 427 062 376  Diastolic 80 76 74  Pulse 85 75 82     Physical Exam  She remains obese but has lost 2 pounds since last visit.  Blood pressure is in a good range.     Assessment   1. PCOS (polycystic ovarian syndrome)   2. Obesity (BMI 30-39.9)   3. Malaise and fatigue       Tests ordered Orders Placed This Encounter  Procedures   T3, free   TSH      Plan: 1.  Continue with NP thyroid 120 mg daily and we will check thyroid function today.  If we need to optimize further, I will usually do a twice daily regimen. 2.  She is willing to try other GLP-1 agonist for insulin resistance and she agreed with Trulicity to start with so I have sent a prescription for this. 3.  Follow-up in a couple of months.   Meds ordered this encounter  Medications   Dulaglutide (TRULICITY) 2.83 TD/1.7OH SOPN    Sig: Inject 0.75 mg into the skin once a week.    Dispense:  2 mL    Refill:  3     Lanya Bucks Luther Parody, MD

## 2020-10-17 LAB — TSH

## 2020-10-18 ENCOUNTER — Other Ambulatory Visit (INDEPENDENT_AMBULATORY_CARE_PROVIDER_SITE_OTHER): Payer: BC Managed Care – PPO

## 2020-10-18 ENCOUNTER — Other Ambulatory Visit (INDEPENDENT_AMBULATORY_CARE_PROVIDER_SITE_OTHER): Payer: Self-pay | Admitting: Internal Medicine

## 2020-10-18 DIAGNOSIS — R5381 Other malaise: Secondary | ICD-10-CM

## 2020-10-18 DIAGNOSIS — R5383 Other fatigue: Secondary | ICD-10-CM

## 2020-10-19 LAB — T3, FREE: T3, Free: 5.5 pg/mL — ABNORMAL HIGH (ref 2.3–4.2)

## 2020-10-19 LAB — TSH: TSH: 0.01 mIU/L — ABNORMAL LOW

## 2020-11-17 ENCOUNTER — Encounter (INDEPENDENT_AMBULATORY_CARE_PROVIDER_SITE_OTHER): Payer: Self-pay | Admitting: Internal Medicine

## 2020-11-28 ENCOUNTER — Encounter (INDEPENDENT_AMBULATORY_CARE_PROVIDER_SITE_OTHER): Payer: Self-pay | Admitting: Internal Medicine

## 2020-11-28 ENCOUNTER — Ambulatory Visit (INDEPENDENT_AMBULATORY_CARE_PROVIDER_SITE_OTHER): Payer: BC Managed Care – PPO | Admitting: Internal Medicine

## 2020-11-28 ENCOUNTER — Other Ambulatory Visit: Payer: Self-pay

## 2020-11-28 VITALS — BP 124/76 | HR 80 | Temp 97.1°F | Ht 65.0 in | Wt 198.2 lb

## 2020-11-28 DIAGNOSIS — J40 Bronchitis, not specified as acute or chronic: Secondary | ICD-10-CM | POA: Diagnosis not present

## 2020-11-28 MED ORDER — AZITHROMYCIN 250 MG PO TABS
ORAL_TABLET | ORAL | 0 refills | Status: DC
Start: 2020-11-28 — End: 2021-03-17

## 2020-11-28 NOTE — Progress Notes (Signed)
Metrics: Intervention Frequency ACO  Documented Smoking Status Yearly  Screened one or more times in 24 months  Cessation Counseling or  Active cessation medication Past 24 months  Past 24 months   Guideline developer: UpToDate (See UpToDate for funding source) Date Released: 2014       Wellness Office Visit  Subjective:  Patient ID: Julia Rangel, female    DOB: 10-04-1985  Age: 35 y.o. MRN: [id number]  CC: Productive cough, fatigue. HPI  This lady comes in for an acute visit with a 2-week history of a productive cough of yellow/green sputum.  She has taken 2 COVID test and these have been negative.  She has had a significant sore throat. 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   Dysplasia of cervix    Family history of BRCA1 gene positive    Family history of breast cancer    Gestational diabetes    Kidney stone 2011   Menorrhagia    Migraines    since age 60 per patient   Patient desires pregnancy 06/01/2013   PMDD (premenstrual dysphoric disorder)    PROZAC 20 MG BUT NONE WITH PREGNANCY   PONV (postoperative nausea and vomiting)    Vaginal Pap smear, abnormal    Past Surgical History:  Procedure Laterality Date   CESAREAN SECTION  2010   CESAREAN SECTION N/A 03/29/2014   Procedure: REPEAT CESAREAN SECTION;  Surgeon: Florian Buff, MD;  Location: [facility];  Service: Obstetrics;  Laterality: N/A;   CESAREAN SECTION WITH BILATERAL TUBAL LIGATION Bilateral 05/08/2017   Procedure: CESAREAN SECTION WITH BILATERAL TUBAL LIGATION;  Surgeon: Jonnie Kind, MD;  Location: [facility];  Service: Obstetrics;  Laterality: Bilateral;   HYSTERECTOMY ABDOMINAL WITH SALPINGECTOMY N/A 09/15/2019   Procedure: HYSTERECTOMY ABDOMINAL and OPEN BILATERAL SALPINGECTOMY;  Surgeon: Jonnie Kind, MD;  Location: [facility];  Service: Gynecology;  Laterality: N/A;   KIDNEY STONE SURGERY  2010   stent   LEEP  2008   phylloid removed  2009    left breast   TONSILLECTOMY     WISDOM TOOTH EXTRACTION       Family History  Problem Relation Age of Onset   Diabetes Maternal Grandmother    Breast cancer Maternal Grandmother        dx 40s-50   Diabetes Paternal Grandmother    Breast cancer Paternal Grandmother 29   COPD Paternal Grandfather    Heart disease Maternal Grandfather    Breast cancer Mother 34   BRCA 1/2 Mother        BRCA1 pos   Ovarian cancer Mother    Other Mother        peritoneal cancer   Colon polyps Maternal Uncle    SIDS Maternal Aunt        2 babies died of SIDS at 44 months    Social History   Social History Narrative   Married for 6 years.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   Social History   Tobacco Use   Smoking status: Never   Smokeless tobacco: Never  Substance Use Topics   Alcohol use: Yes    Alcohol/week: 7.0 standard drinks    Types: 7 Glasses of wine per week    Current Meds  Medication Sig   azithromycin (ZITHROMAX) 250 MG tablet Take 2 tablets the first day and then 1 tablet every day for the next  4 days   Dulaglutide (TRULICITY) 9.35 LE/1.7GJ SOPN Inject 0.75 mg into the skin once a week.   EMGALITY 120 MG/ML SOSY INJECT 120MG SUBCUTANEOUSLY ONCE A MONTH. MUST BE AT ROOM TEMPERATURE 30 MINUTES PRIOR TO INJECTION   FLUoxetine (PROZAC) 20 MG tablet Take 1 tablet by mouth once daily   FLUoxetine (PROZAC) 40 MG capsule Take 1 capsule (40 mg total) by mouth daily.   Multiple Vitamins-Minerals (MULTIVITAMIN WITH MINERALS) tablet Take 1 tablet by mouth daily.   NP THYROID 120 MG tablet Take 1 tablet (120 mg total) by mouth daily before breakfast.   SUMAtriptan (IMITREX) 100 MG tablet Take 100 mg by mouth every 2 (two) hours as needed for migraine. May repeat in 2 hours if headache persists or recurs.     [place] Office Visit from 10/13/2020 in [facility]  PHQ-9 Total Score 1       Objective:   Today's Vitals: BP 124/76    Pulse 80   Temp (!) 97.1 F (36.2 C) (Temporal)   Ht '5\' 5"'  (1.651 m)   Wt 198 lb 3.2 oz (89.9 kg)   LMP 07/14/2019 (Approximate)   SpO2 99%   BMI 32.98 kg/m  Vitals with BMI 11/28/2020 10/13/2020 08/30/2020  Height '5\' 5"'  '5\' 5"'  '5\' 5"'   Weight 198 lbs 3 oz 205 lbs 207 lbs 13 oz  BMI 32.98 15.95 39.67  Systolic 289 791 504  Diastolic 76 80 76  Pulse 80 85 75     Physical Exam She does look systemically well.  Saturation on room air is normal.  Lung fields are entirely clear with no evidence of wheezing.  Throat does not look inflamed.  There are no sinus tenderness.      Assessment   1. Bronchitis       Tests ordered No orders of the defined types were placed in this encounter.    Plan: 1.  I will treat her for bronchitis with Zithromax to see if she will improve.  If she does not improve after about a week or so, she will let us know.    Meds ordered this encounter  Medications   azithromycin (ZITHROMAX) 250 MG tablet    Sig: Take 2 tablets the first day and then 1 tablet every day for the next 4 days    Dispense:  6 tablet    Refill:  0     Heru Montz Luther Parody, MD

## 2020-12-12 ENCOUNTER — Encounter: Payer: Self-pay | Admitting: Emergency Medicine

## 2020-12-12 ENCOUNTER — Ambulatory Visit
Admission: EM | Admit: 2020-12-12 | Discharge: 2020-12-12 | Disposition: A | Discharge status: Home / Self Care | Payer: BC Managed Care – PPO | Attending: Family Medicine | Admitting: Family Medicine

## 2020-12-12 ENCOUNTER — Other Ambulatory Visit: Payer: Self-pay

## 2020-12-12 ENCOUNTER — Ambulatory Visit: Payer: BC Managed Care – PPO

## 2020-12-12 DIAGNOSIS — N390 Urinary tract infection, site not specified: Secondary | ICD-10-CM | POA: Diagnosis not present

## 2020-12-12 LAB — POCT URINALYSIS DIP (MANUAL ENTRY)
Bilirubin, UA: NEGATIVE
Blood, UA: NEGATIVE
Glucose, UA: NEGATIVE mg/dL
Ketones, POC UA: NEGATIVE mg/dL
Nitrite, UA: NEGATIVE
Protein Ur, POC: NEGATIVE mg/dL
Spec Grav, UA: <=1.005 — AB (ref 1.010–1.025)
Urobilinogen, UA: 0.2 E.U./dL
pH, UA: 6 (ref 5.0–8.0)

## 2020-12-12 MED ORDER — NITROFURANTOIN MONOHYD MACRO 100 MG PO CAPS
100.0000 mg | ORAL_CAPSULE | Freq: Two times a day (BID) | ORAL | 0 refills | Status: AC
Start: 2020-12-12 — End: 2020-12-19

## 2020-12-12 MED ORDER — FLUCONAZOLE 150 MG PO TABS
150.0000 mg | ORAL_TABLET | Freq: Once | ORAL | 0 refills | Status: AC
Start: 2020-12-12 — End: 2020-12-12

## 2020-12-12 NOTE — ED Triage Notes (Signed)
Pain on urination with fever, lower back pain and abd feels bloated  symptoms x 2 weeks.  States she has a history of kidney stones.  Just finished a z-pack last week,.

## 2020-12-15 ENCOUNTER — Ambulatory Visit (INDEPENDENT_AMBULATORY_CARE_PROVIDER_SITE_OTHER): Payer: BC Managed Care – PPO | Admitting: Nurse Practitioner

## 2020-12-15 ENCOUNTER — Other Ambulatory Visit (INDEPENDENT_AMBULATORY_CARE_PROVIDER_SITE_OTHER): Payer: Self-pay

## 2020-12-15 ENCOUNTER — Other Ambulatory Visit: Payer: Self-pay

## 2020-12-15 DIAGNOSIS — G43809 Other migraine, not intractable, without status migrainosus: Secondary | ICD-10-CM

## 2020-12-15 MED ORDER — EMGALITY 120 MG/ML ~~LOC~~ SOSY: 120.0000 mg | PREFILLED_SYRINGE | SUBCUTANEOUS | 3 refills | Status: DC

## 2020-12-15 NOTE — Progress Notes (Signed)
Due to national recommendations of social distancing related to the [place] pandemic, an audio-only tele-health visit was felt to be the most appropriate encounter type for this patient today. I connected with  Julia Rangel on 12/15/20 utilizing audio-only technology and verified that I am speaking with the correct person using two identifiers. The patient was located at their home, and I was located at the office of [facility] during the encounter. I discussed the limitations of evaluation and management by telemedicine. The patient expressed understanding and agreed to proceed.    Subjective:  Patient ID: Julia Rangel, female    DOB: 1985-07-10  Age: 35 y.o. MRN: [id number]  CC:  Chief Complaint  Patient presents with   Migraine      HPI  This patient arrives today for acute telehealth visit for the above.  She is requesting refill on her Emgality for preventative treatment of migraines.  She does not have a migraine currently, but has been having to take Imitrex for abortive migraine the last couple of days and is out of her Emgality.  She does not see a neurologist on a regular basis for treatment of migraines.  She has no acute complaints today other than request for refill.  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   Dysplasia of cervix    Family history of BRCA1 gene positive    Family history of breast cancer    Gestational diabetes    Kidney stone 2011   Menorrhagia    Migraines    since age 13 per patient   Patient desires pregnancy 06/01/2013   PMDD (premenstrual dysphoric disorder)    PROZAC 20 MG BUT NONE WITH PREGNANCY   PONV (postoperative nausea and vomiting)    Vaginal Pap smear, abnormal       Family History  Problem Relation Age of Onset   Diabetes Maternal Grandmother    Breast cancer Maternal Grandmother        dx 40s-50   Diabetes Paternal Grandmother    Breast cancer Paternal  Grandmother 17   COPD Paternal Grandfather    Heart disease Maternal Grandfather    Breast cancer Mother 80   BRCA 1/2 Mother        BRCA1 pos   Ovarian cancer Mother    Other Mother        peritoneal cancer   Colon polyps Maternal Uncle    SIDS Maternal Aunt        2 babies died of SIDS at 76 months    Social History   Social History Narrative   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   Social History   Tobacco Use   Smoking status: Never   Smokeless tobacco: Never  Substance Use Topics   Alcohol use: Yes    Alcohol/week: 7.0 standard drinks    Types: 7 Glasses of wine per week     No outpatient medications have been marked as taking for the 12/15/20 encounter (Office Visit) with Ailene Ards, NP.     Objective:   Today's Vitals: LMP 07/14/2019 (Approximate)  Vitals with BMI 12/15/2020 12/12/2020 11/28/2020  Height - - _0   Weight - - 198 lbs 3 oz  BMI - - 90.38  Systolic (No Data) 333 832  Diastolic (No Data) 74 76  Pulse - 90 80     Physical  Exam Comprehensive physical exam not completed today as office visit was conducted remotely.  Patient sounded well over the phone.  Patient was alert and oriented, and appeared to have appropriate judgment.       Assessment and Plan   1. Other migraine without status migrainosus, not intractable      Plan: 1.  I have not prescribed Emgality before.  I did reach out to my supervising physician, looked up the medication, and reached out to neurologist.  Myself, neurologist, and supervising physician are under the agreement that this medication is generally safe and well-tolerated.  I will order refill for this patient while she tries to get established with a new primary care provider.  Patient was encouraged to call his office if needed before office closes at the end of the month.  She expresses her understanding.   Tests ordered No orders of the defined types  were placed in this encounter.     Meds ordered this encounter  Medications   EMGALITY 120 MG/ML SOSY    Sig: Inject 120 mg into the skin every 30 (thirty) days.    Dispense:  1 mL    Refill:  3    Order Specific Question:   Supervising Provider    Answer:   Lindell Spar [id number]    Patient not scheduled for follow-up as this office is closing permanently as of 01/04/21 due to the passing of Dr. Anastasio Champion.  The patient was notified of this and that they will need to find a new primary care provider.  They express understanding.  Total time spent on the phone today was 4 minutes.   Ailene Ards, NP

## 2020-12-20 ENCOUNTER — Ambulatory Visit (INDEPENDENT_AMBULATORY_CARE_PROVIDER_SITE_OTHER): Payer: BC Managed Care – PPO | Admitting: Internal Medicine

## 2020-12-26 ENCOUNTER — Telehealth (INDEPENDENT_AMBULATORY_CARE_PROVIDER_SITE_OTHER): Payer: Self-pay

## 2020-12-26 DIAGNOSIS — R5381 Other malaise: Secondary | ICD-10-CM

## 2020-12-26 DIAGNOSIS — G43809 Other migraine, not intractable, without status migrainosus: Secondary | ICD-10-CM

## 2020-12-26 DIAGNOSIS — R5383 Other fatigue: Secondary | ICD-10-CM

## 2020-12-26 MED ORDER — SUMATRIPTAN SUCCINATE 100 MG PO TABS: 100.0000 mg | ORAL_TABLET | Freq: Once | ORAL | 2 refills | Status: DC

## 2020-12-26 NOTE — Telephone Encounter (Signed)
Called patient and left a detailed voice message of the message from Judson Roch.

## 2020-12-26 NOTE — Telephone Encounter (Signed)
Patient called and needs a refill on the following medications to be sent to [facility] in [place]:  NP THYROID 120 MG tablet  Last filled 08/30/20, # 30 with 3 refills  SUMAtriptan (IMITREX) 100 MG tablet  Needs refills since not able to get Emgality and generic is fine  Patient stated that her insurance is not wanting to cover the Emgality 120 mg/ml and she has been taking this for 2 years and not sure what she can take that is similar?    Patient has an appointment with Dr. Posey Pronto on 02/08/21.

## 2020-12-26 NOTE — Telephone Encounter (Signed)
I would recommend she come off of the thyroid medication as I do not see any history of hypothyroidism. Please call and verify that she does not have hypothyroidism. If she does not then I would recommend she taper off of the medication and she should take '90mg'$  by mouth daily x 1 week, then '60mg'$  by mouth daily x 1 week, then 30 mg by mouth daily x 1 week, then stop the medication. As far as the emgality, I would recommend she consult with neurology about other preventative treatment options so I will place order for referral today. I will order refill of her sumatriptan and will order the NP thyroid taper pending your discussion with her. Also, please provider her with the other BHRT providers in the area. Thank you.

## 2020-12-27 ENCOUNTER — Other Ambulatory Visit: Payer: Self-pay | Admitting: Obstetrics & Gynecology

## 2020-12-27 MED ORDER — THYROID 60 MG PO TABS: ORAL_TABLET | ORAL | 1 refills | Status: DC

## 2020-12-27 MED ORDER — THYROID 90 MG PO TABS: ORAL_TABLET | ORAL | 0 refills | Status: DC

## 2020-12-27 NOTE — Addendum Note (Signed)
Addended by: Ailene Ards on: 12/27/2020 03:16 PM   Modules accepted: Orders

## 2020-12-27 NOTE — Telephone Encounter (Signed)
Called patient and she stated that Dr. Anastasio Champion told her she has hypothyroidism and she needed to be on the thyroid also due to her levels in her labs. Patient would like to have her thyroid refilled and has an upcoming appointment with Dr. Posey Pronto and has a neurology appointment in November.

## 2020-12-29 ENCOUNTER — Ambulatory Visit (INDEPENDENT_AMBULATORY_CARE_PROVIDER_SITE_OTHER): Payer: BC Managed Care – PPO | Admitting: Internal Medicine

## 2021-02-08 ENCOUNTER — Telehealth: Payer: Self-pay | Admitting: Internal Medicine

## 2021-02-08 ENCOUNTER — Ambulatory Visit: Payer: BC Managed Care – PPO | Admitting: Internal Medicine

## 2021-02-08 NOTE — Telephone Encounter (Signed)
Pt is calling in to apologize of the missed appt today, her child has RSV.  However she takes Trulicity can you call some in --on good faith until she comes in --or does she need to see urgent Care.  She was [place] pt.  She is also a Education officer, museum

## 2021-02-09 ENCOUNTER — Other Ambulatory Visit: Payer: Self-pay | Admitting: Internal Medicine

## 2021-02-09 DIAGNOSIS — E669 Obesity, unspecified: Secondary | ICD-10-CM

## 2021-02-09 MED ORDER — TRULICITY 0.75 MG/0.5ML ~~LOC~~ SOAJ: 0.7500 mg | SUBCUTANEOUS | 0 refills | Status: DC

## 2021-02-09 NOTE — Telephone Encounter (Signed)
Missed new pt appt yesterday please advise

## 2021-02-15 NOTE — Telephone Encounter (Signed)
Pt is aware of appointment 

## 2021-03-01 ENCOUNTER — Other Ambulatory Visit: Payer: Self-pay | Admitting: Internal Medicine

## 2021-03-01 DIAGNOSIS — E669 Obesity, unspecified: Secondary | ICD-10-CM

## 2021-03-02 NOTE — Telephone Encounter (Signed)
Dr Posey Pronto filled 03/02/2021

## 2021-03-17 ENCOUNTER — Ambulatory Visit: Payer: BC Managed Care – PPO | Admitting: Internal Medicine

## 2021-03-17 ENCOUNTER — Encounter: Payer: Self-pay | Admitting: Internal Medicine

## 2021-03-17 ENCOUNTER — Other Ambulatory Visit: Payer: Self-pay

## 2021-03-17 VITALS — BP 117/82 | HR 76 | Temp 98.3°F | Resp 18 | Ht 66.0 in | Wt 201.1 lb

## 2021-03-17 DIAGNOSIS — G43709 Chronic migraine without aura, not intractable, without status migrainosus: Secondary | ICD-10-CM

## 2021-03-17 DIAGNOSIS — K625 Hemorrhage of anus and rectum: Secondary | ICD-10-CM

## 2021-03-17 DIAGNOSIS — E782 Mixed hyperlipidemia: Secondary | ICD-10-CM | POA: Insufficient documentation

## 2021-03-17 DIAGNOSIS — Z2821 Immunization not carried out because of patient refusal: Secondary | ICD-10-CM

## 2021-03-17 DIAGNOSIS — E669 Obesity, unspecified: Secondary | ICD-10-CM | POA: Diagnosis not present

## 2021-03-17 DIAGNOSIS — E559 Vitamin D deficiency, unspecified: Secondary | ICD-10-CM

## 2021-03-17 DIAGNOSIS — D500 Iron deficiency anemia secondary to blood loss (chronic): Secondary | ICD-10-CM | POA: Diagnosis not present

## 2021-03-17 DIAGNOSIS — E039 Hypothyroidism, unspecified: Secondary | ICD-10-CM | POA: Insufficient documentation

## 2021-03-17 MED ORDER — TRULICITY 0.75 MG/0.5ML ~~LOC~~ SOAJ: 0.7500 mg | SUBCUTANEOUS | 2 refills | Status: DC

## 2021-03-17 NOTE — Progress Notes (Signed)
New Patient Office Visit  Subjective:  Patient ID: Julia Rangel, female    DOB: January 17, 1986  Age: 35 y.o. MRN: [id number]  CC:  Chief Complaint  Patient presents with   New Patient (Initial Visit)    New patient was seeing dr Anastasio Champion pt has had blood in her stool for the last month sometimes a lot sometimes a little sometimes bright red and sometimes not     HPI Julia Rangel is a 35 y.o. female with past medical history of migraine, IDA, HLD and obesity who presents for establishing care.  Migraine: Currently well controlled with Emgality and as needed Imitrex.  She requires Imitrex very rarely now.  IDA: She had history of menorrhagia, s/p abdominal hysterectomy now.  She complains of rectal bleeding, which is intermittent and painless.  She denies any family history of colon cancer.  Denies any nausea, vomiting, diarrhea or abdominal pain.  She has history of obesity, for which she has been following low-carb diet and has been placed on Trulicity.  She has lost about 20 lbs with Trulicity.  She takes NP thyroid 60 mg once daily.  Chart review suggests normal TSH when NP thyroid was started.  She states that she feels more energetic and her hair and skin have been better with NP thyroid.  I had discussion about its potential side effects and will continue NP thyroid based on her TSH and free T4 results.  She has intermittent tremors, but denies any chest pain, dyspnea or palpitations.  She has had 3 doses of COVID-vaccine.  She has not received flu vaccine yet.  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   Dysplasia of cervix    Family history of BRCA1 gene positive    Family history of breast cancer    Gestational diabetes    History of cesarean delivery 10/09/2016   X2 plans rpeat   Kidney stone 2011   Menorrhagia    Migraines    since age 7 per patient   Patient desires pregnancy 06/01/2013   PMDD (premenstrual dysphoric  disorder)    PROZAC 20 MG BUT NONE WITH PREGNANCY   PONV (postoperative nausea and vomiting)    Vaginal Pap smear, abnormal     Past Surgical History:  Procedure Laterality Date   CESAREAN SECTION  2010   CESAREAN SECTION N/A 03/29/2014   Procedure: REPEAT CESAREAN SECTION;  Surgeon: Florian Buff, MD;  Location: [facility];  Service: Obstetrics;  Laterality: N/A;   CESAREAN SECTION WITH BILATERAL TUBAL LIGATION Bilateral 05/08/2017   Procedure: CESAREAN SECTION WITH BILATERAL TUBAL LIGATION;  Surgeon: Jonnie Kind, MD;  Location: [facility];  Service: Obstetrics;  Laterality: Bilateral;   HYSTERECTOMY ABDOMINAL WITH SALPINGECTOMY N/A 09/15/2019   Procedure: HYSTERECTOMY ABDOMINAL and OPEN BILATERAL SALPINGECTOMY;  Surgeon: Jonnie Kind, MD;  Location: [facility];  Service: Gynecology;  Laterality: N/A;   KIDNEY STONE SURGERY  2010   stent   LEEP  2008   phylloid removed  2009   left breast   TONSILLECTOMY     WISDOM TOOTH EXTRACTION      Family History  Problem Relation Age of Onset   Diabetes Maternal Grandmother    Breast cancer Maternal Grandmother        dx 40s-50   Diabetes Paternal Grandmother    Breast cancer Paternal Grandmother 63   COPD Paternal Grandfather    Heart disease Maternal  Grandfather    Breast cancer Mother 65   BRCA 1/2 Mother        BRCA1 pos   Ovarian cancer Mother    Other Mother        peritoneal cancer   Colon polyps Maternal Uncle    SIDS Maternal Aunt        2 babies died of SIDS at 46 months    Social History   Socioeconomic History   Marital status: Married    Spouse name: Not on file   Number of children: 3   Years of education: Not on file   Highest education level: Not on file  Occupational History   Not on file  Tobacco Use   Smoking status: Never   Smokeless tobacco: Never  Vaping Use   Vaping Use: Never used  Substance and Sexual Activity   Alcohol use: Yes    Alcohol/week: 7.0 standard drinks    Types: 7 Glasses  of wine per week   Drug use: No   Sexual activity: Not Currently    Birth control/protection: Surgical    Comment: tubal,hysterectomy  Other Topics Concern   Not on file  Social History Narrative   Married for 6 years.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   Social Determinants of Health   Financial Resource Strain: Not on file  Food Insecurity: Not on file  Transportation Needs: Not on file  Physical Activity: Not on file  Stress: Not on file  Social Connections: Not on file  Intimate Partner Violence: Not on file    ROS Review of Systems  Constitutional:  Negative for chills and fever.  HENT:  Negative for congestion, sinus pressure, sinus pain and sore throat.   Eyes:  Negative for pain and discharge.  Respiratory:  Negative for cough and shortness of breath.   Cardiovascular:  Negative for chest pain and palpitations.  Gastrointestinal:  Positive for blood in stool. Negative for abdominal pain, constipation, diarrhea, nausea and vomiting.  Endocrine: Negative for polydipsia and polyuria.  Genitourinary:  Negative for dysuria and hematuria.  Musculoskeletal:  Negative for neck pain and neck stiffness.  Skin:  Negative for rash.  Neurological:  Positive for tremors. Negative for dizziness and weakness.  Psychiatric/Behavioral:  Negative for agitation and behavioral problems.    Objective:   Today's Vitals: BP 117/82 (BP Location: Left Arm, Patient Position: Sitting, Cuff Size: Normal)   Pulse 76   Temp 98.3 F (36.8 C) (Oral)   Resp 18   Ht _0  (1.676 m)   Wt 201 lb 1.9 oz (91.2 kg)   LMP 07/14/2019 (Approximate)   SpO2 100%   BMI 32.46 kg/m   Physical Exam Vitals reviewed.  Constitutional:      General: She is not in acute distress.    Appearance: She is obese. She is not diaphoretic.  HENT:     Head: Normocephalic and atraumatic.     Nose: Nose normal.     Mouth/Throat:     Mouth: Mucous membranes are moist.  Eyes:      General: No scleral icterus.    Extraocular Movements: Extraocular movements intact.  Cardiovascular:     Rate and Rhythm: Normal rate and regular rhythm.     Pulses: Normal pulses.     Heart sounds: Normal heart sounds. No murmur heard. Pulmonary:     Breath sounds: Normal breath sounds. No wheezing or rales.  Abdominal:     Palpations: Abdomen is soft.  Tenderness: There is no abdominal tenderness.  Musculoskeletal:     Cervical back: Neck supple. No tenderness.     Right lower leg: No edema.     Left lower leg: No edema.  Skin:    General: Skin is warm.     Findings: No rash.  Neurological:     General: No focal deficit present.     Mental Status: She is alert and oriented to person, place, and time.  Psychiatric:        Mood and Affect: Mood normal.        Behavior: Behavior normal.    Assessment & Plan:   Problem List Items Addressed This Visit       Cardiovascular and Mediastinum   Migraine - Primary    Well controlled with Emgality and as needed Imitrex Does not have a neurologist currently        Digestive   Rectal bleeding    Intermittent episodes of BRBPR, denies any rectal pain No h/o external hemorrhoids Referred to GI for further eval      Relevant Orders   Ambulatory referral to Gastroenterology     Endocrine   Hypothyroidism    Lab Results  Component Value Date   TSH 0.01 (L) 10/18/2020  Chart review does not show any history of elevated TSH or low free T4 On NP thyroid 60 mg daily Appears to be oversupplemented Check TSH and free T4 Discussed about tapering NP thyroid      Relevant Orders   TSH + free T4     Other   Anemia    Had IDA likely due to chronic blood loss, had 2 PRBC transfusions Last CBC showed Hb of 13 Currently has rectal bleeding, intermittent-referred to GI      Relevant Orders   CBC with Differential/Platelet   Obesity (BMI 30.0-34.9)    Has lost about 20 lbs with Trulicity Continue to follow low-carb diet  and perform moderate exercise/walking as tolerated      Relevant Medications   Dulaglutide (TRULICITY) 6.78 LF/8.1OF SOPN   Other Relevant Orders   CMP14+EGFR   Mixed hyperlipidemia    Last lipid profile reviewed Continue to follow low-carb diet Check lipid profile      Relevant Orders   Lipid panel   Other Visit Diagnoses     Vitamin D deficiency       Relevant Orders   VITAMIN D 25 Hydroxy (Vit-D Deficiency, Fractures)   Refused influenza vaccine           Outpatient Encounter Medications as of 03/17/2021  Medication Sig   EMGALITY 120 MG/ML SOSY Inject 120 mg into the skin every 30 (thirty) days.   FLUoxetine (PROZAC) 20 MG tablet Take 1 tablet by mouth once daily   FLUoxetine (PROZAC) 40 MG capsule Take 1 capsule by mouth once daily   Multiple Vitamins-Minerals (MULTIVITAMIN WITH MINERALS) tablet Take 1 tablet by mouth daily.   thyroid (NP THYROID) 60 MG tablet Take 90 mg by mouth daily for 2 weeks, then take 60 mg by mouth daily.   [DISCONTINUED] TRULICITY 7.51 WC/5.8NI SOPN INJECT 0.75 MG INTO THE SKIN ONCE A WEEK   Dulaglutide (TRULICITY) 7.78 EU/2.3NT SOPN Inject 0.75 mg into the skin once a week.   SUMAtriptan (IMITREX) 100 MG tablet Take 1 tablet (100 mg total) by mouth once for 1 dose. You may take 1 additional tablet in 2 hours is headache persists despite taking your first dose.   [DISCONTINUED] azithromycin (ZITHROMAX)  250 MG tablet Take 2 tablets the first day and then 1 tablet every day for the next 4 days   [DISCONTINUED] thyroid (NP THYROID) 90 MG tablet Take 59m by mouth daily for 2 weeks, then take 64mby mouth daily. (Patient not taking: Reported on 03/17/2021)   No facility-administered encounter medications on file as of 03/17/2021.    Follow-up: Return in about 4 months (around 07/15/2021) for Hypothyroidism.   RuLindell SparMD

## 2021-03-17 NOTE — Assessment & Plan Note (Signed)
Last lipid profile reviewed Continue to follow low-carb diet Check lipid profile

## 2021-03-17 NOTE — Assessment & Plan Note (Addendum)
Lab Results  Component Value Date   TSH 0.01 (L) 10/18/2020   Chart review does not show any history of elevated TSH or low free T4 On NP thyroid 60 mg daily Appears to be oversupplemented Check TSH and free T4 Discussed about tapering NP thyroid

## 2021-03-17 NOTE — Assessment & Plan Note (Signed)
Intermittent episodes of BRBPR, denies any rectal pain No h/o external hemorrhoids Referred to GI for further eval

## 2021-03-17 NOTE — Assessment & Plan Note (Signed)
Well controlled with Emgality and as needed Imitrex Does not have a neurologist currently

## 2021-03-17 NOTE — Patient Instructions (Signed)
Please continue taking medications as prescribed.  Please follow low carb diet and perform moderate exercise/walking at least 150 mins/week. 

## 2021-03-17 NOTE — Assessment & Plan Note (Signed)
Has lost about 20 lbs with Trulicity Continue to follow low-carb diet and perform moderate exercise/walking as tolerated

## 2021-03-17 NOTE — Assessment & Plan Note (Addendum)
Had IDA likely due to chronic blood loss, had 2 PRBC transfusions Last CBC showed Hb of 13 Currently has rectal bleeding, intermittent-referred to GI

## 2021-03-18 LAB — CMP14+EGFR
ALT: 20 IU/L (ref 0–32)
AST: 19 IU/L (ref 0–40)
Albumin/Globulin Ratio: 1.6 (ref 1.2–2.2)
Albumin: 4.5 g/dL (ref 3.8–4.8)
Alkaline Phosphatase: 85 IU/L (ref 44–121)
BUN/Creatinine Ratio: 13 (ref 9–23)
BUN: 11 mg/dL (ref 6–20)
Bilirubin Total: 0.4 mg/dL (ref 0.0–1.2)
CO2: 23 mmol/L (ref 20–29)
Calcium: 9.6 mg/dL (ref 8.7–10.2)
Chloride: 102 mmol/L (ref 96–106)
Creatinine, Ser: 0.82 mg/dL (ref 0.57–1.00)
Globulin, Total: 2.9 g/dL (ref 1.5–4.5)
Glucose: 78 mg/dL (ref 70–99)
Potassium: 4.8 mmol/L (ref 3.5–5.2)
Sodium: 139 mmol/L (ref 134–144)
Total Protein: 7.4 g/dL (ref 6.0–8.5)
eGFR: 95 mL/min/{1.73_m2} (ref >59)

## 2021-03-18 LAB — LIPID PANEL
Chol/HDL Ratio: 3.2 ratio (ref 0.0–4.4)
Cholesterol, Total: 199 mg/dL (ref 100–199)
HDL: 63 mg/dL (ref >39)
LDL Chol Calc (NIH): 115 mg/dL — ABNORMAL HIGH (ref 0–99)
Triglycerides: 119 mg/dL (ref 0–149)
VLDL Cholesterol Cal: 21 mg/dL (ref 5–40)

## 2021-03-18 LAB — VITAMIN D 25 HYDROXY (VIT D DEFICIENCY, FRACTURES): Vit D, 25-Hydroxy: 37.1 ng/mL (ref 30.0–100.0)

## 2021-03-18 LAB — CBC WITH DIFFERENTIAL/PLATELET
Basophils Absolute: 0 10*3/uL (ref 0.0–0.2)
Basos: 0 %
EOS (ABSOLUTE): 0.2 10*3/uL (ref 0.0–0.4)
Eos: 4 %
Hematocrit: 41.8 % (ref 34.0–46.6)
Hemoglobin: 14.1 g/dL (ref 11.1–15.9)
Immature Grans (Abs): 0 10*3/uL (ref 0.0–0.1)
Immature Granulocytes: 0 %
Lymphocytes Absolute: 1.7 10*3/uL (ref 0.7–3.1)
Lymphs: 32 %
MCH: 30.7 pg (ref 26.6–33.0)
MCHC: 33.7 g/dL (ref 31.5–35.7)
MCV: 91 fL (ref 79–97)
Monocytes Absolute: 0.4 10*3/uL (ref 0.1–0.9)
Monocytes: 8 %
Neutrophils Absolute: 2.9 10*3/uL (ref 1.4–7.0)
Neutrophils: 56 %
Platelets: 308 10*3/uL (ref 150–450)
RBC: 4.59 x10E6/uL (ref 3.77–5.28)
RDW: 11.1 % — ABNORMAL LOW (ref 11.7–15.4)
WBC: 5.2 10*3/uL (ref 3.4–10.8)

## 2021-03-18 LAB — TSH+FREE T4
Free T4: 0.96 ng/dL (ref 0.82–1.77)
TSH: 0.028 u[IU]/mL — ABNORMAL LOW (ref 0.450–4.500)

## 2021-03-21 ENCOUNTER — Encounter (INDEPENDENT_AMBULATORY_CARE_PROVIDER_SITE_OTHER): Payer: Self-pay | Admitting: *Deleted

## 2021-03-22 ENCOUNTER — Other Ambulatory Visit: Payer: Self-pay | Admitting: Internal Medicine

## 2021-03-22 DIAGNOSIS — E669 Obesity, unspecified: Secondary | ICD-10-CM

## 2021-03-29 ENCOUNTER — Ambulatory Visit (INDEPENDENT_AMBULATORY_CARE_PROVIDER_SITE_OTHER): Payer: Self-pay | Admitting: Neurology

## 2021-03-29 DIAGNOSIS — Z91199 Patient's noncompliance with other medical treatment and regimen due to unspecified reason: Secondary | ICD-10-CM

## 2021-03-29 NOTE — Progress Notes (Signed)
Patient no-showed her new-patient appointment in neurology today. If she calls back she can be scheduled with any physician but please remind her we have many patients waiting for appointments and if she no shows or cancels last minute again she may be dismissed permanently per office policy.  We are happy to see her if she can commit to coming otherwise please find care elsewhere thank you.       History per chart review:  Julia Rangel is a 35 y.o. female here as requested by Ailene Ards, NP for migraines.  Past medical history of benign left breast tumor, family history of BRCA1 gene, obesity, hld, anemia, rectal bleeding intermediate referred to GI, migraines, gestational diabetes, kidney stones.  I reviewed Nelva Bush notes from August of this year, at the time she was on Emgality, taking Imitrex for abortive migraines doing well, she was referred here due to the office closing.  I reviewed Dr. Serita Grit most recent notes from November 2022, currently well controlled with Emgality, and as needed Imitrex, she needs Imitrex very rarely now, she is on Trulicity for obesity and she is lost about 20 pounds, she is on thyroid medications, intermittent tremors.  Reviewed notes, labs and imaging from outside physicians, which showed:  From a thorough review of records, medications tried that can be used in migraine management include Tylenol, Fioricet, Decadron, Benadryl, Emgality, Prozac, gabapentin, ibuprofen, ketorolac injections and tablets, Reglan, Zofran, oxycodone, Phenergan, scopolamine patch, Imitrex tablet 100 mg, amitriptyline and nortriptyline contraindicated because she is already on an SSRI, Topamax contraindicated due to history of kidney stones,    CT head 08/15/2012: showed No acute intracranial abnormalities including mass lesion or mass effect, hydrocephalus, extra-axial fluid collection, midline shift, hemorrhage, or acute infarction, large ischemic events (personally reviewed  images)

## 2021-04-03 ENCOUNTER — Encounter: Payer: Self-pay | Admitting: Neurology

## 2021-04-24 ENCOUNTER — Encounter (INDEPENDENT_AMBULATORY_CARE_PROVIDER_SITE_OTHER): Payer: Self-pay

## 2021-04-24 ENCOUNTER — Other Ambulatory Visit (INDEPENDENT_AMBULATORY_CARE_PROVIDER_SITE_OTHER): Payer: Self-pay

## 2021-04-24 ENCOUNTER — Ambulatory Visit (INDEPENDENT_AMBULATORY_CARE_PROVIDER_SITE_OTHER): Payer: BC Managed Care – PPO | Admitting: Gastroenterology

## 2021-04-24 ENCOUNTER — Other Ambulatory Visit: Payer: Self-pay

## 2021-04-24 ENCOUNTER — Encounter (INDEPENDENT_AMBULATORY_CARE_PROVIDER_SITE_OTHER): Payer: Self-pay | Admitting: Gastroenterology

## 2021-04-24 VITALS — BP 106/73 | HR 90 | Temp 97.9°F | Ht 66.0 in | Wt 202.4 lb

## 2021-04-24 DIAGNOSIS — K625 Hemorrhage of anus and rectum: Secondary | ICD-10-CM | POA: Diagnosis not present

## 2021-04-24 DIAGNOSIS — R197 Diarrhea, unspecified: Secondary | ICD-10-CM

## 2021-04-24 NOTE — Patient Instructions (Addendum)
We will get you scheduled for colonoscopy for further evaluation of your rectal bleeding.  We will also check stool studies to rule out infectious causes of your diarrhea, though I suspect your thyroid could be playing a role in your frequent episodes of watery stools. You can continue to use imodium as needed.    Follow up to be determined after colonoscopy

## 2021-04-24 NOTE — Progress Notes (Signed)
Referring Provider: Lindell Spar, MD Primary Care Physician:  Lindell Spar, MD Primary GI Physician: newly established  Chief Complaint  Patient presents with   Rectal Bleeding    Rectal bleeding, rectal pain, lower abdominal pain, 2 -3 episodes of diarrhea a day- imodium helps a little. Started around summer time.    HPI:   Julia Rangel is a 35 y.o. female with past medical history of anemia, migraines, HLD.   Patient presenting today as new patient, referred by Dr. Posey Pronto, for rectal bleeding.   Rectal bleeding has been ongoing for the past few months with associated watery diarrhea. She states that she began having watery diarrhea, rectal bleeding with blood on stool, in toilet, and on the paper every time she has a BM. She is having 2-3 BMs per day. She reports that diarrhea is worse at night, she denies any fecal incontinence. She has some lower abdominal pain, centrally located, she states that pain improves after she has a BM and then it will return shortly after. She also endorses some rectal discomfort/burning/itching, especially on days that diarrhea is worse. She reports some mucus in her stools as well. She denies any known family hx of IBD. she denies any fevers or chills, no recent abx or travel. She has occasional nausea but no vomiting. She has been taking imodium on a regular basis, but has not taken any imodium since Saturday. She has also taken pepto bismol and tums without any relief. She denies any upper GI symptoms. She denies melena. She has history of lactose intolerance so she avoid dairy but still continues to have symptoms.   Last hgb 14.1 03/17/21, she is without sob, dizziness or fatigue.   Notably, her TSH was 0.028 on 03/17/21 and has been low for the past 9 months. She reports prior to acute onset of diarrhea, she struggled with constipation.    NSAID use: just as needed. 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   Dysplasia of cervix    Family history of BRCA1 gene positive    Family history of breast cancer    Gestational diabetes    History of cesarean delivery 10/09/2016   X2 plans rpeat   Kidney stone 2011   Menorrhagia    Migraines    since age 78 per patient   Patient desires pregnancy 06/01/2013   PMDD (premenstrual dysphoric disorder)    PROZAC 20 MG BUT NONE WITH PREGNANCY   PONV (postoperative nausea and vomiting)    Vaginal Pap smear, abnormal     Past Surgical History:  Procedure Laterality Date   CESAREAN SECTION  2010   CESAREAN SECTION N/A 03/29/2014   Procedure: REPEAT CESAREAN SECTION;  Surgeon: Florian Buff, MD;  Location: [facility];  Service: Obstetrics;  Laterality: N/A;   CESAREAN SECTION WITH BILATERAL TUBAL LIGATION Bilateral 05/08/2017   Procedure: CESAREAN SECTION WITH BILATERAL TUBAL LIGATION;  Surgeon: Jonnie Kind, MD;  Location: [facility];  Service: Obstetrics;  Laterality: Bilateral;   HYSTERECTOMY ABDOMINAL WITH SALPINGECTOMY N/A 09/15/2019   Procedure: HYSTERECTOMY ABDOMINAL and OPEN BILATERAL SALPINGECTOMY;  Surgeon: Jonnie Kind, MD;  Location: [facility];  Service: Gynecology;  Laterality: N/A;   KIDNEY STONE SURGERY  2010   stent   LEEP  2008  phylloid removed  2009   left breast   TONSILLECTOMY     WISDOM TOOTH EXTRACTION      Current Outpatient Medications  Medication Sig Dispense Refill   Dulaglutide (TRULICITY) 1.74 YC/1.4GY SOPN Inject 0.75 mg into the skin once a week. 4 mL 2   EMGALITY 120 MG/ML SOSY Inject 120 mg into the skin every 30 (thirty) days. 1 mL 3   FLUoxetine (PROZAC) 20 MG tablet Take 1 tablet by mouth once daily 30 tablet 11   FLUoxetine (PROZAC) 40 MG capsule Take 1 capsule by mouth once daily 30 capsule 11   Multiple Vitamins-Minerals (MULTIVITAMIN WITH MINERALS) tablet  Take 1 tablet by mouth daily.     SUMAtriptan (IMITREX) 100 MG tablet Take 1 tablet (100 mg total) by mouth once for 1 dose. You may take 1 additional tablet in 2 hours is headache persists despite taking your first dose. 10 tablet 2   thyroid (NP THYROID) 60 MG tablet Take 90 mg by mouth daily for 2 weeks, then take 60 mg by mouth daily. 30 tablet 1   No current facility-administered medications for this visit.    Allergies as of 04/24/2021   (No Known Allergies)    Family History  Problem Relation Age of Onset   Diabetes Maternal Grandmother    Breast cancer Maternal Grandmother        dx 40s-50   Diabetes Paternal Grandmother    Breast cancer Paternal Grandmother 84   COPD Paternal Grandfather    Heart disease Maternal Grandfather    Breast cancer Mother 71   BRCA 1/2 Mother        BRCA1 pos   Ovarian cancer Mother    Other Mother        peritoneal cancer   Colon polyps Maternal Uncle    SIDS Maternal Aunt        2 babies died of SIDS at 62 months    Social History   Socioeconomic History   Marital status: Married    Spouse name: Not on file   Number of children: 3   Years of education: Not on file   Highest education level: Not on file  Occupational History   Not on file  Tobacco Use   Smoking status: Never   Smokeless tobacco: Never  Vaping Use   Vaping Use: Never used  Substance and Sexual Activity   Alcohol use: Yes    Alcohol/week: 7.0 standard drinks    Types: 7 Glasses of wine per week   Drug use: No   Sexual activity: Not Currently    Birth control/protection: Surgical    Comment: tubal,hysterectomy  Other Topics Concern   Not on file  Social History Narrative   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   Social Determinants of Health   Financial Resource Strain: Not on file  Food Insecurity: Not on file  Transportation Needs: Not on file  Physical Activity: Not on file  Stress: Not on file   Social Connections: Not on file   Review of systems General: negative for malaise, night sweats, fever, chills, weight loss Neck: Negative for lumps, goiter, pain and significant neck swelling Resp: Negative for cough, wheezing, dyspnea at rest CV: Negative for chest pain, leg swelling, palpitations, orthopnea GI: denies melena, vomiting, constipation, dysphagia, odyonophagia, early satiety or unintentional weight loss. +hematochezia, +watery diarrhea +mucus in stools +nausea MSK: Negative for joint pain or swelling, back pain, and muscle  pain. Derm: Negative for itching or rash Psych: Denies depression, anxiety, memory loss, confusion. No homicidal or suicidal ideation.  Heme: Negative for prolonged bleeding, bruising easily, and swollen nodes. Endocrine: Negative for cold or heat intolerance, polyuria, polydipsia and goiter. Neuro: negative for tremor, gait imbalance, syncope and seizures. The remainder of the review of systems is noncontributory.  Physical Exam: BP 106/73 (BP Location: Right Arm, Patient Position: Sitting, Cuff Size: Large)    Pulse 90    Temp 97.9 F (36.6 C) (Oral)    Ht _0  (1.676 m)    Wt 202 lb 6.4 oz (91.8 kg)    LMP 07/14/2019 (Approximate)    BMI 32.67 kg/m  General:   Alert and oriented. No distress noted. Pleasant and cooperative.  Head:  Normocephalic and atraumatic. Eyes:  Conjuctiva clear without scleral icterus. Mouth:  Oral mucosa pink and moist. Good dentition. No lesions. Heart: Normal rate and rhythm, s1 and s2 heart sounds present.  Lungs: Clear lung sounds in all lobes. Respirations equal and unlabored. Abdomen:  +BS, soft, NON tender, and non-distended. No rebound or guarding. No HSM or masses noted. Derm: No palmar erythema or jaundice Msk:  Symmetrical without gross deformities. Normal posture. Extremities:  Without edema. Neurologic:  Alert and  oriented x4 Psych:  Alert and cooperative. Normal mood and affect.  Invalid input(s): 6  MONTHS   ASSESSMENT: Kemari Mares is a 35 y.o. female presenting today as a new patient, referred by PCP for rectal bleeding and diarrhea.  Patient reports sudden onset of hematochezia and watery diarrhea back in the summer. She denies any precipitating factors. She has had no associated fevers or chills. She has some occasional central, abdominal pain, however, abdominal exam is benign without any tenderness to palpation. She is taking imodium as needed for diarrhea. She has had no episodes of melena, but has seen some mucus in her stools. Prior to diarrhea and rectal bleeding she had chronic constipation. She does have some rectal discomfort/itching/burning, worse with increased frequency of diarrhea. She declines rectal exam today. Notably, her TSH was 0.028 in November. I suspect she could have multiple contributing factors for her diarrhea and/or rectal bleeding, we cannot rule out infectious etiology, IBD, AVMs, bleeding polyps, proctitis, hemorrhoids or less likely malignancy. Her diarrhea is likely influenced by her hyperactive thyroid as well. We will do stool studies and Colonoscopy (with possible random colonic biopsies if stool studies negative) for further evaluation.    PLAN:  C diff and GI path panel 2. Schedule colonoscopy ( +random colonic bx if stool studies are neg) 3. Can use imodium as needed   Follow Up: TBD after colonoscopy  Dynver Clemson L. Alver Sorrow, MSN, APRN, AGNP-C Adult-Gerontology Nurse Practitioner [facility]

## 2021-04-28 LAB — GASTROINTESTINAL PATHOGEN PANEL PCR
C. difficile Tox A/B, PCR: DETECTED — AB
Campylobacter, PCR: UNDETERMINED — AB
Cryptosporidium, PCR: UNDETERMINED — AB
E coli (ETEC) LT/ST PCR: UNDETERMINED — AB
E coli (STEC) stx1/stx2, PCR: UNDETERMINED — AB
E coli 0157, PCR: UNDETERMINED — AB
Giardia lamblia, PCR: UNDETERMINED — AB
Norovirus, PCR: UNDETERMINED — AB
Rotavirus A, PCR: UNDETERMINED — AB
Salmonella, PCR: UNDETERMINED — AB
Shigella, PCR: UNDETERMINED — AB

## 2021-04-28 LAB — C. DIFFICILE GDH AND TOXIN A/B
GDH ANTIGEN: NOT DETECTED
MICRO NUMBER:: 12788429
SPECIMEN QUALITY:: ADEQUATE
TOXIN A AND B: NOT DETECTED

## 2021-05-04 ENCOUNTER — Ambulatory Visit (HOSPITAL_COMMUNITY)
Admission: RE | Admit: 2021-05-04 | Discharge: 2021-05-04 | Disposition: A | Discharge status: Home / Self Care | Payer: BC Managed Care – PPO | Attending: Internal Medicine | Admitting: Internal Medicine

## 2021-05-04 ENCOUNTER — Encounter (HOSPITAL_COMMUNITY)
Admission: RE | Disposition: A | Discharge status: Home / Self Care | Payer: Self-pay | Source: Home / Self Care | Attending: Internal Medicine

## 2021-05-04 ENCOUNTER — Other Ambulatory Visit: Payer: Self-pay

## 2021-05-04 DIAGNOSIS — K6289 Other specified diseases of anus and rectum: Secondary | ICD-10-CM

## 2021-05-04 DIAGNOSIS — R197 Diarrhea, unspecified: Secondary | ICD-10-CM

## 2021-05-04 DIAGNOSIS — K644 Residual hemorrhoidal skin tags: Secondary | ICD-10-CM

## 2021-05-04 DIAGNOSIS — K625 Hemorrhage of anus and rectum: Secondary | ICD-10-CM

## 2021-05-04 HISTORY — PX: COLONOSCOPY: SHX5424

## 2021-05-04 HISTORY — PX: BIOPSY: SHX5522

## 2021-05-04 LAB — GLUCOSE, CAPILLARY: Glucose-Capillary: 85 mg/dL (ref 70–99)

## 2021-05-04 SURGERY — COLONOSCOPY
Anesthesia: Moderate Sedation

## 2021-05-04 MED ORDER — SODIUM CHLORIDE 0.9 % IV SOLN
INTRAVENOUS | Status: DC
  Administered 2021-05-04: 11:00:00 1000 mL via INTRAVENOUS

## 2021-05-04 MED ORDER — MEPERIDINE HCL 50 MG/ML IJ SOLN
INTRAMUSCULAR | Status: AC
  Filled 2021-05-04: qty 1

## 2021-05-04 MED ORDER — DICYCLOMINE HCL 10 MG PO CAPS: 10.0000 mg | ORAL_CAPSULE | Freq: Two times a day (BID) | ORAL | 2 refills | Status: DC

## 2021-05-04 MED ORDER — MIDAZOLAM HCL 5 MG/5ML IJ SOLN
INTRAMUSCULAR | Status: DC | PRN
  Administered 2021-05-04: 3 mg via INTRAVENOUS
  Administered 2021-05-04 (×3): 2 mg via INTRAVENOUS
  Administered 2021-05-04: 1 mg via INTRAVENOUS

## 2021-05-04 MED ORDER — MIDAZOLAM HCL 5 MG/5ML IJ SOLN
INTRAMUSCULAR | Status: AC
  Filled 2021-05-04: qty 10

## 2021-05-04 MED ORDER — MEPERIDINE HCL 50 MG/ML IJ SOLN
INTRAMUSCULAR | Status: DC | PRN
  Administered 2021-05-04 (×4): 25 mg via INTRAVENOUS

## 2021-05-04 NOTE — Discharge Instructions (Addendum)
No aspirin or NSAIDs for 24 hours. Resume usual medications as before. Dicyclomine 10 mg by mouth 30 minutes before each meal daily.  Can drop dose to twice daily if you experience constipation. Resume usual diet. No driving for 24 hours. Physician will call with biopsy results.

## 2021-05-04 NOTE — Op Note (Signed)
[facility] Patient Name: Julia Rangel Procedure Date: 05/04/2021 11:11 AM MRN: [id number] Date of Birth: 11/19/1985 Attending MD: Hildred Laser , MD CSN: [id number] Age: 35 Admit Type: Outpatient Procedure:                Colonoscopy Indications:              Rectal bleeding, Diarrhea Providers:                Hildred Laser, MD, Rosina Lowenstein, RN, Raphael Gibney,                            Technician Referring MD:             Colin Broach. Posey Pronto, MD Medicines:                Meperidine 100 mg IV, Midazolam 10 mg IV Complications:            No immediate complications. Estimated Blood Loss:     Estimated blood loss was minimal. Procedure:                Pre-Anesthesia Assessment:                           - Prior to the procedure, a History and Physical                            was performed, and patient medications and                            allergies were reviewed. The patient's tolerance of                            previous anesthesia was also reviewed. The risks                            and benefits of the procedure and the sedation                            options and risks were discussed with the patient.                            All questions were answered, and informed consent                            was obtained. Prior Anticoagulants: The patient has                            taken no previous anticoagulant or antiplatelet                            agents. ASA Grade Assessment: II - A patient with                            mild systemic disease. After reviewing the risks  and benefits, the patient was deemed in                            satisfactory condition to undergo the procedure.                           After obtaining informed consent, the colonoscope                            was passed under direct vision. Throughout the                            procedure, the patient's blood pressure, pulse, and                             oxygen saturations were monitored continuously. The                            [id number]-[id number] ([id number]) was introduced through the                            anus and advanced to the the terminal ileum, with                            identification of the appendiceal orifice and IC                            valve. The colonoscopy was performed without                            difficulty. The patient tolerated the procedure                            well. The quality of the bowel preparation was                            excellent. The terminal ileum, ileocecal valve,                            appendiceal orifice, and rectum were photographed. Scope In: 11:48:01 AM Scope Out: 12:04:00 PM Scope Withdrawal Time: 0 hours 10 minutes 17 seconds  Total Procedure Duration: 0 hours 15 minutes 59 seconds  Findings:      The perianal and digital rectal examinations were normal.      The terminal ileum appeared normal.      The colon (entire examined portion) appeared normal. Biopsies for       histology were taken with a cold forceps from the right colon and       sigmoid colon for evaluation of microscopic colitis. The pathology       specimen was placed into Bottle Number 1.      External hemorrhoids were found during retroflexion. The hemorrhoids       were small.      Anal papilla(e) were hypertrophied. Impression:               -  The examined portion of the ileum was normal.                           - The entire examined colon is normal. Biopsied.                           - External hemorrhoids.                           - Anal papilla(e) were hypertrophied. Moderate Sedation:      Moderate (conscious) sedation was administered by the endoscopy nurse       and supervised by the endoscopist. The following parameters were       monitored: oxygen saturation, heart rate, blood pressure, CO2       capnography and response to care. Total physician intraservice time was       27  minutes. Recommendation:           - Patient has a contact number available for                            emergencies. The signs and symptoms of potential                            delayed complications were discussed with the                            patient. Return to normal activities tomorrow.                            Written discharge instructions were provided to the                            patient.                           - Resume previous diet today.                           - Continue present medications.                           - No aspirin, ibuprofen, naproxen, or other                            non-steroidal anti-inflammatory drugs for 1 day.                           - Use Bentyl (dicyclomine) 10 mg PO TID 30 min AC.                           - Await pathology results. Procedure Code(s):        --- Professional ---                           [phone], Colonoscopy, flexible; with biopsy, single  or multiple                           99153, Moderate sedation; each additional 15                            minutes intraservice time                           G0500, Moderate sedation services provided by the                            same physician or other qualified health care                            professional performing a gastrointestinal                            endoscopic service that sedation supports,                            requiring the presence of an independent trained                            observer to assist in the monitoring of the                            patient's level of consciousness and physiological                            status; initial 15 minutes of intra-service time;                            patient age 18 years or older (additional time may                            be reported with [phone], as appropriate) Diagnosis Code(s):        --- Professional ---                           K64.4, Residual  hemorrhoidal skin tags                           K62.89, Other specified diseases of anus and rectum                           K62.5, Hemorrhage of anus and rectum                           R19.7, Diarrhea, unspecified CPT copyright 2019 American Medical Association. All rights reserved. The codes documented in this report are preliminary and upon coder review may  be revised to meet current compliance requirements. Hildred Laser, MD Hildred Laser, MD 05/04/2021 12:16:05 PM This report has been signed electronically. Number of Addenda: 0

## 2021-05-04 NOTE — H&P (Signed)
Julia Rangel is an 35 y.o. female.   Chief Complaint: Patient is here for colonoscopy. HPI: Patient is 35 year old Caucasian female who has been experiencing diarrhea and rectal bleeding for the last 5 months or so.  Her baseline was bowel movement daily or every other day and now she is having 3-4 bowel movements.  Stools are loose.  She is also seeing blood almost daily.  Usually small but at times it can be large.  She also has abdominal cramping.  She has lost 20 pounds.  She feels some of the weight loss is voluntary.  She does not take NSAIDs. She had normal CBC and comprehensive chemistry panel last month.  C. difficile stool testing for antigen and toxin was negative.  GI pathogen panel detected C. difficile but all the other results were noted to be indeterminant.  Therefore this result was felt to be an accurate. Family history is negative for inflammatory bowel disease.  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   Dysplasia of cervix    Family history of BRCA1 gene positive    Family history of breast cancer    Gestational diabetes    History of cesarean delivery 10/09/2016   X2 plans rpeat   Kidney stone 2011   Menorrhagia    Migraines    since age 60 per patient   Patient desires pregnancy 06/01/2013   PMDD (premenstrual dysphoric disorder)    PROZAC 20 MG BUT NONE WITH PREGNANCY   PONV (postoperative nausea and vomiting)    Vaginal Pap smear, abnormal     Past Surgical History:  Procedure Laterality Date   CESAREAN SECTION  2010   CESAREAN SECTION N/A 03/29/2014   Procedure: REPEAT CESAREAN SECTION;  Surgeon: Florian Buff, MD;  Location: [facility];  Service: Obstetrics;  Laterality: N/A;   CESAREAN SECTION WITH BILATERAL TUBAL LIGATION Bilateral 05/08/2017   Procedure: CESAREAN SECTION WITH BILATERAL TUBAL LIGATION;  Surgeon: Jonnie Kind, MD;  Location: [place];  Service: Obstetrics;  Laterality: Bilateral;    HYSTERECTOMY ABDOMINAL WITH SALPINGECTOMY N/A 09/15/2019   Procedure: HYSTERECTOMY ABDOMINAL and OPEN BILATERAL SALPINGECTOMY;  Surgeon: Jonnie Kind, MD;  Location: [facility];  Service: Gynecology;  Laterality: N/A;   KIDNEY STONE SURGERY  2010   stent   LEEP  2008   phylloid removed  2009   left breast   TONSILLECTOMY     WISDOM TOOTH EXTRACTION      Family History  Problem Relation Age of Onset   Diabetes Maternal Grandmother    Breast cancer Maternal Grandmother        dx 40s-50   Diabetes Paternal Grandmother    Breast cancer Paternal Grandmother 35   COPD Paternal Grandfather    Heart disease Maternal Grandfather    Breast cancer Mother 61   BRCA 1/2 Mother        BRCA1 pos   Ovarian cancer Mother    Other Mother        peritoneal cancer   Colon polyps Maternal Uncle    SIDS Maternal Aunt        2 babies died of SIDS at 72 months   Social History:  reports that she has never smoked. She has never used smokeless tobacco. She reports current alcohol use of about 7.0 standard drinks per week. She reports that she does not use drugs.  Allergies: No Known Allergies  Medications Prior  to Admission  Medication Sig Dispense Refill   COLLAGEN PO Take 2 each by mouth daily. Gummies     Dulaglutide (TRULICITY) 4.23 TR/3.2YE SOPN Inject 0.75 mg into the skin once a week. (Patient taking differently: Inject 0.75 mg into the skin every Tuesday.) 4 mL 2   EMGALITY 120 MG/ML SOSY Inject 120 mg into the skin every 30 (thirty) days. 1 mL 3   FLUoxetine (PROZAC) 20 MG tablet Take 1 tablet by mouth once daily 30 tablet 11   FLUoxetine (PROZAC) 40 MG capsule Take 1 capsule by mouth once daily 30 capsule 11   thyroid (NP THYROID) 60 MG tablet Take 90 mg by mouth daily for 2 weeks, then take 60 mg by mouth daily. (Patient taking differently: Take 60 mg by mouth daily before breakfast.) 30 tablet 1   SUMAtriptan (IMITREX) 100 MG tablet Take 100 mg by mouth every 2 (two) hours as needed for  migraine. May repeat in 2 hours if headache persists or recurs.      Results for orders placed or performed during the hospital encounter of 05/04/21 (from the past 48 hour(s))  Glucose, capillary     Status: None   Collection Time: 05/04/21 11:07 AM  Result Value Ref Range   Glucose-Capillary 85 70 - 99 mg/dL    Comment: Glucose reference range applies only to samples taken after fasting for at least 8 hours.   No results found.  Review of Systems  Blood pressure 125/82, pulse 85, temperature 98.4 F (36.9 C), temperature source Oral, resp. rate 17, last menstrual period 07/14/2019, SpO2 98 %. Physical Exam HENT:     Mouth/Throat:     Mouth: Mucous membranes are moist.     Pharynx: Oropharynx is clear.  Eyes:     General: No scleral icterus.    Conjunctiva/sclera: Conjunctivae normal.  Cardiovascular:     Rate and Rhythm: Normal rate and regular rhythm.     Heart sounds: Normal heart sounds. No murmur heard. Pulmonary:     Effort: Pulmonary effort is normal.     Breath sounds: Normal breath sounds.  Abdominal:     General: There is no distension.     Palpations: Abdomen is soft. There is no mass.     Tenderness: There is no abdominal tenderness.  Musculoskeletal:        General: No swelling.     Cervical back: Neck supple.  Lymphadenopathy:     Cervical: No cervical adenopathy.  Skin:    General: Skin is warm and dry.  Neurological:     Mental Status: She is alert.     Assessment/Plan  Diarrhea and rectal bleeding. Diagnostic colonoscopy.  Hildred Laser, MD 05/04/2021, 11:30 AM

## 2021-05-05 LAB — SURGICAL PATHOLOGY

## 2021-05-09 ENCOUNTER — Encounter (INDEPENDENT_AMBULATORY_CARE_PROVIDER_SITE_OTHER): Payer: Self-pay

## 2021-05-10 ENCOUNTER — Other Ambulatory Visit (INDEPENDENT_AMBULATORY_CARE_PROVIDER_SITE_OTHER): Payer: Self-pay | Admitting: Gastroenterology

## 2021-05-10 ENCOUNTER — Encounter (HOSPITAL_COMMUNITY): Payer: Self-pay | Admitting: Internal Medicine

## 2021-05-10 MED ORDER — HYDROCORTISONE (PERIANAL) 2.5 % EX CREA: 1.0000 "application " | TOPICAL_CREAM | Freq: Three times a day (TID) | CUTANEOUS | 2 refills | Status: DC

## 2021-05-10 NOTE — Telephone Encounter (Signed)
Patient aware of all stool testing results,she states she has sent a My Chart Message on 05/09/2020, asked that  Chelsea,please check this as her symptoms have gotten worse.

## 2021-05-11 ENCOUNTER — Other Ambulatory Visit: Payer: Self-pay | Admitting: Advanced Practice Midwife

## 2021-06-23 ENCOUNTER — Ambulatory Visit: Payer: BC Managed Care – PPO

## 2021-07-17 ENCOUNTER — Ambulatory Visit: Payer: BC Managed Care – PPO | Admitting: Internal Medicine

## 2021-07-26 ENCOUNTER — Other Ambulatory Visit: Payer: Self-pay | Admitting: Internal Medicine

## 2021-07-26 DIAGNOSIS — E669 Obesity, unspecified: Secondary | ICD-10-CM

## 2021-07-28 ENCOUNTER — Ambulatory Visit: Payer: BC Managed Care – PPO | Admitting: Internal Medicine

## 2021-07-29 ENCOUNTER — Other Ambulatory Visit: Payer: Self-pay

## 2021-07-29 ENCOUNTER — Ambulatory Visit
Admission: RE | Admit: 2021-07-29 | Discharge: 2021-07-29 | Disposition: A | Discharge status: Home / Self Care | Payer: BC Managed Care – PPO | Source: Ambulatory Visit | Attending: Student | Admitting: Student

## 2021-07-29 VITALS — BP 136/80 | HR 86 | Temp 97.8°F | Resp 18

## 2021-07-29 DIAGNOSIS — N3000 Acute cystitis without hematuria: Secondary | ICD-10-CM | POA: Diagnosis not present

## 2021-07-29 LAB — POCT URINALYSIS DIP (MANUAL ENTRY)
Bilirubin, UA: NEGATIVE
Blood, UA: NEGATIVE
Glucose, UA: NEGATIVE mg/dL
Ketones, POC UA: NEGATIVE mg/dL
Nitrite, UA: NEGATIVE
Protein Ur, POC: NEGATIVE mg/dL
Spec Grav, UA: 1.025 (ref 1.010–1.025)
Urobilinogen, UA: 0.2 E.U./dL
pH, UA: 7 (ref 5.0–8.0)

## 2021-07-29 MED ORDER — CEPHALEXIN 500 MG PO CAPS: 500.0000 mg | ORAL_CAPSULE | Freq: Four times a day (QID) | ORAL | 0 refills | Status: DC

## 2021-07-29 NOTE — ED Triage Notes (Signed)
Pt states she has had a UTI for 2 months ? ?Pt states she is experiencing hurting when urinating, bloating, chills and Migraines ? ?Pt states her urine has a smell ? ?Pt states she is already prescribed meds for Migraines ? ?Denies Meds ?

## 2021-07-29 NOTE — ED Provider Notes (Signed)
?[facility] ? ? ? ?CSN: [id number] ?Arrival date & time: 07/29/21  1328 ? ? ?  ? ?History   ?Chief Complaint ?Chief Complaint  ?Patient presents with  ? Migraine  ?  I have a bad UTI for months now and I think the infection has spread to my kidneys and causing my migraine. - Entered by patient  ? ? ?HPI ?Julia Rangel is a 36 y.o. female presenting with intermittent urinary symptoms for months.  She has been followed by urology for nephrolithiasis in the past, last UTI was 8/22 and was managed with Macrobid with partial resolution.  Patient states that she has had intermittent urinary symptoms since then, worse over the last 2 months.  Describes foul smell to urine and dysuria with frequency and bloating.  Also with subjective chills and migraines; however, she has a long history of migraines and takes sumatriptan and Emgality for that.  She denies vaginal symptoms or external vaginal irritation, and states that she does not want to be screened for vaginitis.  There is some suprapubic pressure, but denies flank pain, fevers ? ?HPI ? ?Past Medical History:  ?Diagnosis Date  ? Anemia 09/2019  ? Breast tumor 2009  ? L breast benign  ? Dysplasia of cervix   ? Family history of BRCA1 gene positive   ? Family history of breast cancer   ? Gestational diabetes   ? History of cesarean delivery 10/09/2016  ? X2 plans rpeat  ? Kidney stone 2011  ? Menorrhagia   ? Migraines   ? since age 58 per patient  ? Patient desires pregnancy 06/01/2013  ? PMDD (premenstrual dysphoric disorder)   ? PROZAC 20 MG BUT NONE WITH PREGNANCY  ? PONV (postoperative nausea and vomiting)   ? Vaginal Pap smear, abnormal   ? ? ?Patient Active Problem List  ? Diagnosis Date Noted  ? Rectal bleeding 03/17/2021  ? Hypothyroidism 03/17/2021  ? Mixed hyperlipidemia 03/17/2021  ? Menorrhagia with regular cycle 09/15/2019  ? S/P abdominal hysterectomy 09/15/2019  ? Family history of ovarian cancer 05/20/2019  ? Family history of breast cancer   ?  Family history of BRCA1 gene positive   ? Ovarian cyst 11/08/2016  ? Migraine 02/02/2015  ? Obesity (BMI 30.0-34.9) 02/02/2015  ? 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 Anemia 09/19/2012  ? Menorrhagia 09/19/2012  ? PMDD (premenstrual dysphoric disorder) 09/19/2012  ? Severe dysplasia of cervix (CIN III) 09/19/2012  ? ? ?Past Surgical History:  ?Procedure Laterality Date  ? BIOPSY N/A 05/04/2021  ? Procedure: BIOPSY;  Surgeon: Rogene Houston, MD;  Location: [facility];  Service: Endoscopy;  Laterality: N/A;  ? CESAREAN SECTION  2010  ? CESAREAN SECTION N/A 03/29/2014  ? Procedure: REPEAT CESAREAN SECTION;  Surgeon: Florian Buff, MD;  Location: [facility];  Service: Obstetrics;  Laterality: N/A;  ? CESAREAN SECTION WITH BILATERAL TUBAL LIGATION Bilateral 05/08/2017  ? Procedure: CESAREAN SECTION WITH BILATERAL TUBAL LIGATION;  Surgeon: Jonnie Kind, MD;  Location: [facility];  Service: Obstetrics;  Laterality: Bilateral;  ? COLONOSCOPY N/A 05/04/2021  ? Procedure: COLONOSCOPY;  Surgeon: Rogene Houston, MD;  Location: [facility];  Service: Endoscopy;  Laterality: N/A;  1245  ? HYSTERECTOMY ABDOMINAL WITH SALPINGECTOMY N/A 09/15/2019  ? Procedure: HYSTERECTOMY ABDOMINAL and OPEN BILATERAL SALPINGECTOMY;  Surgeon: Jonnie Kind, MD;  Location: [facility];  Service: Gynecology;  Laterality: N/A;  ? KIDNEY STONE SURGERY  2010  ?  stent  ? LEEP  2008  ? phylloid removed  2009  ? left breast  ? TONSILLECTOMY    ? WISDOM TOOTH EXTRACTION    ? ? ?OB History   ? ? Gravida  ?3  ? Para  ?3  ? Term  ?3  ? Preterm  ?   ? AB  ?   ? Living  ?3  ?  ? ? SAB  ?   ? IAB  ?   ? Ectopic  ?   ? Multiple  ?0  ? Live Births  ?3  ?   ?  ?  ? ? ? ?Home Medications   ? ?Prior to Admission medications   ?Medication Sig Start Date End Date Taking? Authorizing Provider  ?cephALEXin (KEFLEX) 500 MG capsule Take 1 capsule (500 mg total) by mouth 4 (four) times daily. 07/29/21  Yes Hazel Sams, PA-C   ?COLLAGEN PO Take 2 each by mouth daily. Gummies    [provider]  ?EMGALITY 120 MG/ML SOSY Inject 120 mg into the skin every 30 (thirty) days. 12/15/20   Ailene Ards, NP  ?FLUoxetine (PROZAC) 20 MG tablet Take 1 tablet by mouth once daily 05/18/21   Christin Fudge, CNM  ?FLUoxetine (PROZAC) 40 MG capsule Take 1 capsule by mouth once daily 12/28/20   Florian Buff, MD  ?SUMAtriptan (IMITREX) 100 MG tablet Take 100 mg by mouth every 2 (two) hours as needed for migraine. May repeat in 2 hours if headache persists or recurs.    [provider]  ?TRULICITY 6.94 WN/4.6EV SOPN INJECT 0.75MG SUBCUTANEOUSLY ONCE A WEEK 07/26/21   Lindell Spar, MD  ? ? ?Family History ?Family History  ?Problem Relation Age of Onset  ? Diabetes Maternal Grandmother   ? Breast cancer Maternal Grandmother   ?     dx 40s-50  ? Diabetes Paternal Grandmother   ? Breast cancer Paternal Grandmother 8  ? COPD Paternal Grandfather   ? Heart disease Maternal Grandfather   ? Breast cancer Mother 64  ? BRCA 1/2 Mother   ?     BRCA1 pos  ? Ovarian cancer Mother   ? Other Mother   ?     peritoneal cancer  ? Colon polyps Maternal Uncle   ? SIDS Maternal Aunt   ?     2 babies died of SIDS at 29 months  ? ? ?Social History ?Social History  ? ?Tobacco Use  ? Smoking status: Never  ? Smokeless tobacco: Never  ?Vaping Use  ? Vaping Use: Never used  ?Substance Use Topics  ? Alcohol use: Yes  ?  Alcohol/week: 7.0 standard drinks  ?  Types: 7 Glasses of wine per week  ? Drug use: No  ? ? ? ?Allergies   ?Patient has no known allergies. ? ? ?Review of Systems ?Review of Systems  ?Constitutional:  Negative for chills and fever.  ?HENT:  Negative for sore throat.   ?Eyes:  Negative for pain and redness.  ?Respiratory:  Negative for shortness of breath.   ?Cardiovascular:  Negative for chest pain.  ?Gastrointestinal:  Negative for abdominal pain, diarrhea, nausea and vomiting.  ?Genitourinary:  Positive for frequency. Negative for  decreased urine volume, difficulty urinating, dysuria, flank pain, genital sores, hematuria, urgency, vaginal discharge and vaginal pain.  ?Musculoskeletal:  Negative for back pain.  ?Skin:  Negative for rash.  ?All other systems reviewed and are negative. ? ? ?Physical Exam ?Triage Vital Signs ?ED Triage  Vitals  ?Enc Vitals Group  ?   BP 07/29/21 1412 136/80  ?   Pulse Rate 07/29/21 1412 86  ?   Resp 07/29/21 1412 18  ?   Temp 07/29/21 1412 97.8 ?F (36.6 ?C)  ?   Temp Source 07/29/21 1412 Oral  ?   SpO2 07/29/21 1412 100 %  ?   Weight --   ?   Height --   ?   Head Circumference --   ?   Peak Flow --   ?   Pain Score 07/29/21 1409 7  ?   Pain Loc --   ?   Pain Edu? --   ?   Excl. in [place]? --   ? ?No data found. ? ?Updated Vital Signs ?BP 136/80 (BP Location: Right Arm)   Pulse 86   Temp 97.8 ?F (36.6 ?C) (Oral)   Resp 18   LMP 07/14/2019 (Approximate)   SpO2 100%  ? ?Visual Acuity ?Right Eye Distance:   ?Left Eye Distance:   ?Bilateral Distance:   ? ?Right Eye Near:   ?Left Eye Near:    ?Bilateral Near:    ? ?Physical Exam ?Vitals reviewed.  ?Constitutional:   ?   General: She is not in acute distress. ?   Appearance: Normal appearance. She is not ill-appearing.  ?HENT:  ?   Head: Normocephalic and atraumatic.  ?   Mouth/Throat:  ?   Mouth: Mucous membranes are moist.  ?   Comments: Moist mucous membranes ?Eyes:  ?   Extraocular Movements: Extraocular movements intact.  ?   Pupils: Pupils are equal, round, and reactive to light.  ?Cardiovascular:  ?   Rate and Rhythm: Normal rate and regular rhythm.  ?   Heart sounds: Normal heart sounds.  ?Pulmonary:  ?   Effort: Pulmonary effort is normal.  ?   Breath sounds: Normal breath sounds. No wheezing, rhonchi or rales.  ?Abdominal:  ?   General: Bowel sounds are normal. There is no distension.  ?   Palpations: Abdomen is soft. There is no mass.  ?   Tenderness: There is no abdominal tenderness. There is no right CVA tenderness, left CVA tenderness, guarding or rebound.   ?   Comments: No reproducible pain   ?Skin: ?   General: Skin is warm.  ?   Capillary Refill: Capillary refill takes less than 2 seconds.  ?   Comments: Good skin turgor  ?Neurological:  ?   General: No focal defic

## 2021-07-29 NOTE — Discharge Instructions (Addendum)
-  Start the antibiotic: Keflex, 4x daily x5 days. You can take this with food if you have a sensitive stomach. ?-Drink plenty of fluids  ?-Follow-up with urology if symptoms persist  ?

## 2021-07-30 LAB — URINE CULTURE: Culture: <10000 — AB

## 2021-08-14 ENCOUNTER — Encounter: Payer: Self-pay | Admitting: Internal Medicine

## 2021-08-14 ENCOUNTER — Encounter (INDEPENDENT_AMBULATORY_CARE_PROVIDER_SITE_OTHER): Payer: Self-pay | Admitting: Gastroenterology

## 2021-08-14 ENCOUNTER — Encounter (INDEPENDENT_AMBULATORY_CARE_PROVIDER_SITE_OTHER): Payer: Self-pay

## 2021-08-14 ENCOUNTER — Ambulatory Visit: Payer: BC Managed Care – PPO | Admitting: Internal Medicine

## 2021-08-14 ENCOUNTER — Ambulatory Visit (INDEPENDENT_AMBULATORY_CARE_PROVIDER_SITE_OTHER): Payer: BC Managed Care – PPO | Admitting: Gastroenterology

## 2021-08-14 VITALS — BP 112/72 | HR 81 | Resp 18 | Ht 66.0 in | Wt 214.0 lb

## 2021-08-14 DIAGNOSIS — N3000 Acute cystitis without hematuria: Secondary | ICD-10-CM | POA: Diagnosis not present

## 2021-08-14 DIAGNOSIS — G43709 Chronic migraine without aura, not intractable, without status migrainosus: Secondary | ICD-10-CM

## 2021-08-14 DIAGNOSIS — E039 Hypothyroidism, unspecified: Secondary | ICD-10-CM | POA: Diagnosis not present

## 2021-08-14 DIAGNOSIS — M25511 Pain in right shoulder: Secondary | ICD-10-CM | POA: Diagnosis not present

## 2021-08-14 DIAGNOSIS — G8929 Other chronic pain: Secondary | ICD-10-CM

## 2021-08-14 DIAGNOSIS — E669 Obesity, unspecified: Secondary | ICD-10-CM

## 2021-08-14 DIAGNOSIS — Z87442 Personal history of urinary calculi: Secondary | ICD-10-CM

## 2021-08-14 LAB — POCT URINALYSIS DIP (CLINITEK)
Bilirubin, UA: NEGATIVE
Blood, UA: NEGATIVE
Glucose, UA: NEGATIVE mg/dL
Ketones, POC UA: NEGATIVE mg/dL
Leukocytes, UA: NEGATIVE
Nitrite, UA: POSITIVE — AB
POC PROTEIN,UA: NEGATIVE
Spec Grav, UA: 1.015 (ref 1.010–1.025)
Urobilinogen, UA: 0.2 E.U./dL
pH, UA: 6.5 (ref 5.0–8.0)

## 2021-08-14 MED ORDER — SEMAGLUTIDE-WEIGHT MANAGEMENT 1.7 MG/0.75ML ~~LOC~~ SOAJ: 1.7000 mg | SUBCUTANEOUS | 0 refills | Status: AC

## 2021-08-14 MED ORDER — SEMAGLUTIDE-WEIGHT MANAGEMENT 0.25 MG/0.5ML ~~LOC~~ SOAJ: 0.2500 mg | SUBCUTANEOUS | 0 refills | Status: AC

## 2021-08-14 MED ORDER — SEMAGLUTIDE-WEIGHT MANAGEMENT 2.4 MG/0.75ML ~~LOC~~ SOAJ: 2.4000 mg | SUBCUTANEOUS | 0 refills | Status: DC

## 2021-08-14 MED ORDER — SEMAGLUTIDE-WEIGHT MANAGEMENT 0.5 MG/0.5ML ~~LOC~~ SOAJ: 0.5000 mg | SUBCUTANEOUS | 0 refills | Status: DC

## 2021-08-14 MED ORDER — SULFAMETHOXAZOLE-TRIMETHOPRIM 800-160 MG PO TABS
1.0000 | ORAL_TABLET | Freq: Two times a day (BID) | ORAL | 0 refills | Status: DC
Start: 2021-08-14 — End: 2021-12-14

## 2021-08-14 MED ORDER — SEMAGLUTIDE-WEIGHT MANAGEMENT 1 MG/0.5ML ~~LOC~~ SOAJ: 1.0000 mg | SUBCUTANEOUS | 0 refills | Status: AC

## 2021-08-14 NOTE — Patient Instructions (Addendum)
Please start taking Bactrim for UTI. ? ?Please start taking Wegovy instead of Trulicity. ? ?Please continue to follow low carb diet and perform moderate exercise/walking at least 150 mins/week. ?

## 2021-08-14 NOTE — Progress Notes (Signed)
? ?Established Patient Office Visit ? ?Subjective:  ?Patient ID: Julia Rangel, female    DOB: Oct 19, 1985  Age: 36 y.o. MRN: [id number] ? ?CC:  ?Chief Complaint  ?Patient presents with  ? Follow-up  ?  4 month follow up pt thinks she tore rotator cuff right side did get cortisone shot a few years ago but fell a few weeks ago and since has burning down neck in to shoulder and cant raise arm pt also went to urgent care for UTI around 2 weeks ago but doesn't feel like this went away  ? ? ?HPI ?Julia Rangel is a 37 y.o. female with past medical history of migraine, IDA, HLD and obesity who presents for f/u of her chronic medical conditions. ? ?She complains of right shoulder pain after a fall few weeks ago.  She has history of likely rotator cuff injury, and had steroid injections at that time.  She currently denies any numbness or tingling of the hand.  She has pain while movement of the right shoulder, especially with extension. ? ?She recently went to ER for UTI, and was given Keflex.  She still complains of dysuria, but denies any fever, chills or hematuria currently.  She has had recurrent UTI.  She also has history of nephrolithiasis in the past, which required urology procedure. ? ?Migraine: Currently well controlled with Emgality and as needed Imitrex.  She requires Imitrex very rarely now. ? ?She has stopped taking NP thyroid as advised in the last visit.  Her TSH and free T4 were WNL recently.  She currently denies any recent worsening of fatigue. ? ?She has been struggling to lose weight despite trying to follow low-carb diet.  She is willing to start GLP-1 agonist therapy for weight loss. ? ?Past Medical History:  ?Diagnosis Date  ? Anemia 09/2019  ? Breast tumor 2009  ? L breast benign  ? Dysplasia of cervix   ? Family history of BRCA1 gene positive   ? Family history of breast cancer   ? Gestational diabetes   ? History of cesarean delivery 10/09/2016  ? X2 plans rpeat  ? Kidney stone 2011  ? Menorrhagia   ?  Migraines   ? since age 77 per patient  ? Patient desires pregnancy 06/01/2013  ? PMDD (premenstrual dysphoric disorder)   ? PROZAC 20 MG BUT NONE WITH PREGNANCY  ? PONV (postoperative nausea and vomiting)   ? Vaginal Pap smear, abnormal   ? ? ?Past Surgical History:  ?Procedure Laterality Date  ? BIOPSY N/A 05/04/2021  ? Procedure: BIOPSY;  Surgeon: Rogene Houston, MD;  [facility];  Service: Endoscopy;  Laterality: N/A;  ? CESAREAN SECTION  2010  ? CESAREAN SECTION N/A 03/29/2014  ? Procedure: REPEAT CESAREAN SECTION;  Surgeon: Florian Buff, MD;  Location: [facility];  Service: Obstetrics;  Laterality: N/A;  ? CESAREAN SECTION WITH BILATERAL TUBAL LIGATION Bilateral 05/08/2017  ? Procedure: CESAREAN SECTION WITH BILATERAL TUBAL LIGATION;  Surgeon: Jonnie Kind, MD;  Location: [facility];  Service: Obstetrics;  Laterality: Bilateral;  ? COLONOSCOPY N/A 05/04/2021  ? Procedure: COLONOSCOPY;  Surgeon: Rogene Houston, MD;  [facility];  Service: Endoscopy;  Laterality: N/A;  1245  ? HYSTERECTOMY ABDOMINAL WITH SALPINGECTOMY N/A 09/15/2019  ? Procedure: HYSTERECTOMY ABDOMINAL and OPEN BILATERAL SALPINGECTOMY;  Surgeon: Jonnie Kind, MD;  Location: [facility];  Service: Gynecology;  Laterality: N/A;  ? KIDNEY STONE SURGERY  2010  ? stent  ? LEEP  2008  ? phylloid removed  2009  ? left breast  ? TONSILLECTOMY    ? WISDOM TOOTH EXTRACTION    ? ? ?Family History  ?Problem Relation Age of Onset  ? Diabetes Maternal Grandmother   ? Breast cancer Maternal Grandmother   ?     dx 40s-50  ? Diabetes Paternal Grandmother   ? Breast cancer Paternal Grandmother 52  ? COPD Paternal Grandfather   ? Heart disease Maternal Grandfather   ? Breast cancer Mother 51  ? BRCA 1/2 Mother   ?     BRCA1 pos  ? Ovarian cancer Mother   ? Other Mother   ?     peritoneal cancer  ? Colon polyps Maternal Uncle   ? SIDS Maternal Aunt   ?     2 babies died of SIDS at 66 months  ? ? ?Social History  ? ?Socioeconomic  History  ? Marital status: Married  ?  Spouse name: Not on file  ? Number of children: 3  ? Years of education: Not on file  ? Highest education level: Not on file  ?Occupational History  ? Not on file  ?Tobacco Use  ? Smoking status: Never  ? Smokeless tobacco: Never  ?Vaping Use  ? Vaping Use: Never used  ?Substance and Sexual Activity  ? Alcohol use: Yes  ?  Alcohol/week: 7.0 standard drinks  ?  Types: 7 Glasses of wine per week  ? Drug use: No  ? Sexual activity: Not Currently  ?  Birth control/protection: Surgical  ?  Comment: tubal,hysterectomy  ?Other Topics Concern  ? Not on file  ?Social History Narrative  ? 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  ? ?Social Determinants of Health  ? ?Financial Resource Strain: Not on file  ?Food Insecurity: Not on file  ?Transportation Needs: Not on file  ?Physical Activity: Not on file  ?Stress: Not on file  ?Social Connections: Not on file  ?Intimate Partner Violence: Not on file  ? ? ?Outpatient Medications Prior to Visit  ?Medication Sig Dispense Refill  ? COLLAGEN PO Take 2 each by mouth daily. Gummies    ? EMGALITY 120 MG/ML SOSY Inject 120 mg into the skin every 30 (thirty) days. 1 mL 3  ? FLUoxetine (PROZAC) 20 MG tablet Take 1 tablet by mouth once daily 30 tablet 11  ? FLUoxetine (PROZAC) 40 MG capsule Take 1 capsule by mouth once daily 30 capsule 11  ? SUMAtriptan (IMITREX) 100 MG tablet Take 100 mg by mouth every 2 (two) hours as needed for migraine. May repeat in 2 hours if headache persists or recurs.    ? TRULICITY 3.82 NK/5.3ZJ SOPN INJECT 0.75MG SUBCUTANEOUSLY ONCE A WEEK 4 mL 0  ? cephALEXin (KEFLEX) 500 MG capsule Take 1 capsule (500 mg total) by mouth 4 (four) times daily. (Patient not taking: Reported on 08/14/2021) 20 capsule 0  ? ?No facility-administered medications prior to visit.  ? ? ?No Known Allergies ? ?ROS ?Review of Systems  ?Constitutional:  Negative for chills and fever.  ?HENT:  Negative  for congestion, sinus pressure, sinus pain and sore throat.   ?Eyes:  Negative for pain and discharge.  ?Respiratory:  Negative for cough and shortness of breath.   ?Cardiovascular:  Negative for chest pain and palpitations.  ?Gastrointestinal:  Negative for abdominal pain, diarrhea, nausea and vomiting.  ?Endocrine: Negative for polydipsia and polyuria.  ?Genitourinary:  Positive for dysuria. Negative for  hematuria.  ?Musculoskeletal:  Positive for arthralgias (R shoulder). Negative for neck pain and neck stiffness.  ?Skin:  Negative for rash.  ?Neurological:  Positive for tremors. Negative for dizziness and weakness.  ?Psychiatric/Behavioral:  Negative for agitation and behavioral problems.   ? ?  ?Objective:  ?  ?Physical Exam ?Vitals reviewed.  ?Constitutional:   ?   General: She is not in acute distress. ?   Appearance: She is obese. She is not diaphoretic.  ?HENT:  ?   Head: Normocephalic and atraumatic.  ?   Nose: Nose normal.  ?   Mouth/Throat:  ?   Mouth: Mucous membranes are moist.  ?Eyes:  ?   General: No scleral icterus. ?   Extraocular Movements: Extraocular movements intact.  ?Cardiovascular:  ?   Rate and Rhythm: Normal rate and regular rhythm.  ?   Pulses: Normal pulses.  ?   Heart sounds: Normal heart sounds. No murmur heard. ?Pulmonary:  ?   Breath sounds: Normal breath sounds. No wheezing or rales.  ?Abdominal:  ?   Palpations: Abdomen is soft.  ?   Tenderness: There is no abdominal tenderness.  ?Musculoskeletal:  ?   Cervical back: Neck supple. No tenderness.  ?   Right lower leg: No edema.  ?   Left lower leg: No edema.  ?   Comments: ROM limited at right shoulder due to pain  ?Skin: ?   General: Skin is warm.  ?   Findings: No rash.  ?Neurological:  ?   General: No focal deficit present.  ?   Mental Status: She is alert and oriented to person, place, and time.  ?Psychiatric:     ?   Mood and Affect: Mood normal.     ?   Behavior: Behavior normal.  ? ? ?BP 112/72 (BP Location: Right Arm, Patient  Position: Sitting, Cuff Size: Normal)   Pulse 81   Resp 18   Ht _0  (1.676 m)   Wt 214 lb (97.1 kg)   LMP 07/14/2019 (Approximate)   SpO2 99%   BMI 34.54 kg/m?  ?Wt Readings from Last 3 Encounters:  ?04/13

## 2021-08-15 LAB — BASIC METABOLIC PANEL
BUN/Creatinine Ratio: 16 (ref 9–23)
BUN: 13 mg/dL (ref 6–20)
CO2: 23 mmol/L (ref 20–29)
Calcium: 9.6 mg/dL (ref 8.7–10.2)
Chloride: 103 mmol/L (ref 96–106)
Creatinine, Ser: 0.82 mg/dL (ref 0.57–1.00)
Glucose: 74 mg/dL (ref 70–99)
Potassium: 4.4 mmol/L (ref 3.5–5.2)
Sodium: 139 mmol/L (ref 134–144)
eGFR: 94 mL/min/{1.73_m2} (ref >59)

## 2021-08-15 LAB — TSH+FREE T4
Free T4: 1.02 ng/dL (ref 0.82–1.77)
TSH: 1.73 u[IU]/mL (ref 0.450–4.500)

## 2021-08-17 ENCOUNTER — Ambulatory Visit (INDEPENDENT_AMBULATORY_CARE_PROVIDER_SITE_OTHER): Payer: BC Managed Care – PPO

## 2021-08-17 ENCOUNTER — Ambulatory Visit (INDEPENDENT_AMBULATORY_CARE_PROVIDER_SITE_OTHER): Payer: BC Managed Care – PPO | Admitting: Urology

## 2021-08-17 ENCOUNTER — Encounter: Payer: Self-pay | Admitting: Orthopaedic Surgery

## 2021-08-17 ENCOUNTER — Ambulatory Visit: Payer: BC Managed Care – PPO | Admitting: Orthopaedic Surgery

## 2021-08-17 ENCOUNTER — Encounter: Payer: Self-pay | Admitting: Urology

## 2021-08-17 VITALS — BP 101/68 | HR 77

## 2021-08-17 VITALS — BP 119/83 | HR 90 | Ht 66.0 in | Wt 210.2 lb

## 2021-08-17 DIAGNOSIS — N390 Urinary tract infection, site not specified: Secondary | ICD-10-CM

## 2021-08-17 DIAGNOSIS — M25511 Pain in right shoulder: Secondary | ICD-10-CM

## 2021-08-17 DIAGNOSIS — N200 Calculus of kidney: Secondary | ICD-10-CM

## 2021-08-17 DIAGNOSIS — M7530 Calcific tendinitis of unspecified shoulder: Secondary | ICD-10-CM

## 2021-08-17 DIAGNOSIS — N393 Stress incontinence (female) (male): Secondary | ICD-10-CM | POA: Diagnosis not present

## 2021-08-17 LAB — URINE CULTURE

## 2021-08-17 MED ORDER — TRIMETHOPRIM 100 MG PO TABS: 100.0000 mg | ORAL_TABLET | Freq: Every day | ORAL | 1 refills | Status: DC

## 2021-08-17 NOTE — Progress Notes (Signed)
My shoulder is worse. ? ?She fell about five weeks ago and re injured her right shoulder.  I saw her for this in summer of 2020.  She has seen Dr. Posey Pronto and he asked that I see her again for this. ? ?She has problems raising the right shoulder above the head and with extension.  She has some popping but no swelling, no numbness. She has not improved with Tylenol, Advil or heat and ice.  She is concerned about rotator cuff injury as that is what I expected in 2020. ? ?Examination of right Upper Extremity is done. ? Inspection: ?  Overall:  Elbow non-tender without crepitus or defects, forearm non-tender without crepitus or defects, wrist non-tender without crepitus or defects, hand non-tender.  ?  Shoulder: with glenohumeral joint tenderness, without effusion. ?  Upper arm:  without swelling and tenderness ? ? Range of motion: ?  Overall:  Full range of motion of the elbow, full range of motion of wrist and full range of motion in fingers. ?  Shoulder:  right  140 degrees forward flexion; 120 degrees abduction; 20 degrees internal rotation, 20 degrees external rotation, 5 degrees extension, 40 degrees adduction. ? ? Stability: ?  Overall:  Shoulder, elbow and wrist stable ? ? Strength and Tone: ?  Overall full shoulder muscles strength, full upper arm strength and normal upper arm bulk and tone.  ? ?X-rays were done of the right shoulder, reported separately. ? ?She has calcific deposit in the right shoulder and high riding humerus suggesting rotator cuff problem. ? ?Encounter Diagnoses  ?Name Primary?  ? Acute pain of right shoulder Yes  ? Calcific bursitis of shoulder   ? ?PROCEDURE NOTE: ? ?The patient request injection, verbal consent was obtained. ? ?The right shoulder was prepped appropriately after time out was performed.  ? ?Sterile technique was observed and injection of 1 cc of DepoMedrol '40mg'$  with several cc's of plain xylocaine. Anesthesia was provided by ethyl chloride and a 20-gauge needle was used to  inject the shoulder area. A posterior approach was used.  The injection was tolerated well. ? ?A band aid dressing was applied. ? ?The patient was advised to apply ice later today and tomorrow to the injection sight as needed. ? ?I would like to get MRI of the right shoulder.  I am concerned about rotator cuff tear.  She has had problems with the right shoulder for some time and not improved. ? ?Return in two weeks. ? ?Call if any problem. ? ?Precautions discussed. ? ?Electronically Signed ?Sanjuana Kava, MD ?4/13/202311:49 AM ? ?

## 2021-08-17 NOTE — Progress Notes (Signed)
?Subjective: ?1. Nephrolithiasis   ?2. Recurrent UTI   ?3. Stress incontinence, female   ?  ? ?Consult requested by Dr. Ihor Dow.  ? ?Julia Rangel is a 36 yo female with a history of stones who required ureteroscopy in 2010 after stenting for a septic 2m UVJ stone.   [facility] [place] for [place] but a CT just showed tiny RLP stones.  She was seen recently in the ED and a culture on 4/10 grew e. Coli for which she jsut finished bactrim.  A culture done on 07/29/21 for UTI symptoms was negative.  She was given Keflex at that encounter.   She started with the recurrent UTI's last year and has been on multiple antibiotics.   She will have dysuria and flank pain along with fever, chills and sweats.  Her migraines worsen with the stones.  She has had intermittent hematuria.   The symptoms partial resolve with the antibiotics.  The urine smell has resolved.   She has no pneumaturia or fecaluria.   She has some issues with SUI and will occasionally wear a pad.  She hasn't noticed an aggravating factor.     ?ROS: ? ?Review of Systems  ?Constitutional:  Positive for chills and malaise/fatigue.  ?     Night sweats  ?Skin:  Positive for itching.  ?Neurological:  Positive for headaches.  ?Endo/Heme/Allergies:  Positive for polydipsia.  ? ?No Known Allergies ? ?Past Medical History:  ?Diagnosis Date  ? Anemia 09/2019  ? Breast tumor 2009  ? L breast benign  ? Dysplasia of cervix   ? Family history of BRCA1 gene positive   ? Family history of breast cancer   ? Gestational diabetes   ? History of cesarean delivery 10/09/2016  ? X2 plans rpeat  ? Kidney stone 2011  ? Menorrhagia   ? Migraines   ? since age 89per patient  ? Patient desires pregnancy 06/01/2013  ? PMDD (premenstrual dysphoric disorder)   ? PROZAC 20 MG BUT NONE WITH PREGNANCY  ? PONV (postoperative nausea and vomiting)   ? Vaginal Pap smear, abnormal   ? ? ?Past Surgical History:  ?Procedure Laterality Date  ? BIOPSY N/A 05/04/2021  ? Procedure:  BIOPSY;  Surgeon: RRogene Houston MD;  [facility];  Service: Endoscopy;  Laterality: N/A;  ? CESAREAN SECTION  2010  ? CESAREAN SECTION N/A 03/29/2014  ? Procedure: REPEAT CESAREAN SECTION;  Surgeon: LFlorian Buff MD;  [facility];  Service: Obstetrics;  Laterality: N/A;  ? CESAREAN SECTION WITH BILATERAL TUBAL LIGATION Bilateral 05/08/2017  ? Procedure: CESAREAN SECTION WITH BILATERAL TUBAL LIGATION;  Surgeon: FJonnie Kind MD;  [facility]  Service: Obstetrics;  Laterality: Bilateral;  ? COLONOSCOPY N/A 05/04/2021  ? Procedure: COLONOSCOPY;  Surgeon: RRogene Houston MD;  [facility];  Service: Endoscopy;  Laterality: N/A;  1245  ? HYSTERECTOMY ABDOMINAL WITH SALPINGECTOMY N/A 09/15/2019  ? Procedure: HYSTERECTOMY ABDOMINAL and OPEN BILATERAL SALPINGECTOMY;  Surgeon: FJonnie Kind MD;  [facility];  Service: Gynecology;  Laterality: N/A;  ? KIDNEY STONE SURGERY  2010  ? stent  ? LEEP  2008  ? phylloid removed  2009  ? left breast  ? TONSILLECTOMY    ? WISDOM TOOTH EXTRACTION    ? ? ?Social History  ? ?Socioeconomic History  ? Marital status: Married  ?  Spouse name: Not on file  ? Number of children: 3  ?  Years of education: Not on file  ? Highest education level: Not on file  ?Occupational History  ? Not on file  ?Tobacco Use  ? Smoking status: Never  ? Smokeless tobacco: Never  ?Vaping Use  ? Vaping Use: Never used  ?Substance and Sexual Activity  ? Alcohol use: Yes  ?  Alcohol/week: 7.0 standard drinks  ?  Types: 7 Glasses of wine per week  ? Drug use: No  ? Sexual activity: Not Currently  ?  Birth control/protection: Surgical  ?  Comment: tubal,hysterectomy  ?Other Topics Concern  ? Not on file  ?Social History Narrative  ? 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  ? ?Social Determinants of Health  ? ?Financial Resource Strain: Not on file  ?Food Insecurity: Not on file  ?Transportation Needs: Not on  file  ?Physical Activity: Not on file  ?Stress: Not on file  ?Social Connections: Not on file  ?Intimate Partner Violence: Not on file  ? ? ?Family History  ?Problem Relation Age of Onset  ? Diabetes Maternal Grandmother   ? Breast cancer Maternal Grandmother   ?     dx 40s-50  ? Diabetes Paternal Grandmother   ? Breast cancer Paternal Grandmother 20  ? COPD Paternal Grandfather   ? Heart disease Maternal Grandfather   ? Breast cancer Mother 2  ? BRCA 1/2 Mother   ?     BRCA1 pos  ? Ovarian cancer Mother   ? Other Mother   ?     peritoneal cancer  ? Colon polyps Maternal Uncle   ? SIDS Maternal Aunt   ?     2 babies died of SIDS at 61 months  ? ? ?Anti-infectives: ?Anti-infectives (From admission, onward)  ? ? Start     Dose/Rate Route Frequency Ordered Stop  ? 08/17/21 0000  trimethoprim (TRIMPEX) 100 MG tablet       ? 100 mg Oral Daily at bedtime 08/17/21 1605    ? ?  ? ? ?Current Outpatient Medications  ?Medication Sig Dispense Refill  ? COLLAGEN PO Take 2 each by mouth daily. Gummies    ? EMGALITY 120 MG/ML SOSY Inject 120 mg into the skin every 30 (thirty) days. 1 mL 3  ? FLUoxetine (PROZAC) 20 MG tablet Take 1 tablet by mouth once daily 30 tablet 11  ? FLUoxetine (PROZAC) 40 MG capsule Take 1 capsule by mouth once daily 30 capsule 11  ? Semaglutide-Weight Management 0.25 MG/0.5ML SOAJ Inject 0.25 mg into the skin once a week for 28 days. 2 mL 0  ? [START ON 09/12/2021] Semaglutide-Weight Management 0.5 MG/0.5ML SOAJ Inject 0.5 mg into the skin once a week for 28 days. 2 mL 0  ? [START ON 10/11/2021] Semaglutide-Weight Management 1 MG/0.5ML SOAJ Inject 1 mg into the skin once a week for 28 days. 2 mL 0  ? [START ON 11/09/2021] Semaglutide-Weight Management 1.7 MG/0.75ML SOAJ Inject 1.7 mg into the skin once a week for 28 days. 3 mL 0  ? [START ON 12/08/2021] Semaglutide-Weight Management 2.4 MG/0.75ML SOAJ Inject 2.4 mg into the skin once a week for 28 days. 3 mL 0  ? sulfamethoxazole-trimethoprim (BACTRIM DS) 800-160  MG tablet Take 1 tablet by mouth 2 (two) times daily. 10 tablet 0  ? SUMAtriptan (IMITREX) 100 MG tablet Take 100 mg by mouth every 2 (two) hours as needed for migraine. May repeat in 2 hours if headache persists or recurs.    ?  trimethoprim (TRIMPEX) 100 MG tablet Take 1 tablet (100 mg total) by mouth at bedtime. 90 tablet 1  ? ?No current facility-administered medications for this visit.  ? ? ? ?Objective: ?Vital signs in last 24 hours: ?BP 101/68   Pulse 77   LMP 07/14/2019 (Approximate)  ? ?Intake/Output from previous day: ?No intake/output data recorded. ?Intake/Output this shift: ?'@IOTHISSHIFT' @ ? ? ?Physical Exam ?Vitals reviewed.  ?Constitutional:   ?   Appearance: Normal appearance.  ?Cardiovascular:  ?   Rate and Rhythm: Normal rate and regular rhythm.  ?   Heart sounds: Normal heart sounds.  ?Pulmonary:  ?   Effort: Pulmonary effort is normal.  ?   Breath sounds: Normal breath sounds.  ?Abdominal:  ?   Palpations: Abdomen is soft. There is no mass.  ?   Tenderness: There is no abdominal tenderness. There is no right CVA tenderness or left CVA tenderness.  ?Genitourinary: ?   Comments: Nl ext genitalia. ?No introital stenosis or mucosal atrophy. ?Meatus normal. ?Mild/mod hypermobility of the urethra without leakage with coughing. ?No prolapse noted. ?No adnexal mass noted. ?S/p hysterectomy and salpingectomy ?Musculoskeletal:     ?   General: No swelling. Normal range of motion.  ?Skin: ?   General: Skin is warm and dry.  ?Neurological:  ?   General: No focal deficit present.  ?   Mental Status: She is alert and oriented to person, place, and time.  ? ? ?Lab Results:  ?No results found for this or any previous visit (from the past 24 hour(s)).  ?BMET ?No results for input(s): NA, K, CL, CO2, GLUCOSE, BUN, CREATININE, CALCIUM in the last 72 hours. ?PT/INR ?No results for input(s): LABPROT, INR in the last 72 hours. ?ABG ?No results for input(s): PHART, HCO3 in the last 72 hours. ? ?Invalid input(s): PCO2,  PO2 ? ?Studies/Results: ?PVR is 23m. ? ?CT from [facility] from 2021 reviewed and noted above.   ? ?Prior records from [facility] reviewed.  ? ? ?Assessment/Plan: ?Recurrent UTI's.   I am going to put he

## 2021-08-18 ENCOUNTER — Encounter: Payer: Self-pay | Admitting: Orthopaedic Surgery

## 2021-08-18 ENCOUNTER — Other Ambulatory Visit: Payer: Self-pay | Admitting: Orthopedic Surgery

## 2021-08-18 ENCOUNTER — Ambulatory Visit (HOSPITAL_COMMUNITY)
Admission: RE | Admit: 2021-08-18 | Discharge: 2021-08-18 | Disposition: A | Discharge status: Home / Self Care | Payer: BC Managed Care – PPO | Source: Ambulatory Visit | Attending: Urology | Admitting: Urology

## 2021-08-18 DIAGNOSIS — N200 Calculus of kidney: Secondary | ICD-10-CM | POA: Insufficient documentation

## 2021-08-18 DIAGNOSIS — R52 Pain, unspecified: Secondary | ICD-10-CM

## 2021-08-18 LAB — URINALYSIS, ROUTINE W REFLEX MICROSCOPIC
Bilirubin, UA: NEGATIVE
Glucose, UA: NEGATIVE
Ketones, UA: NEGATIVE
Leukocytes,UA: NEGATIVE
Nitrite, UA: NEGATIVE
Protein,UA: NEGATIVE
RBC, UA: NEGATIVE
Specific Gravity, UA: 1.01 (ref 1.005–1.030)
Urobilinogen, Ur: 0.2 mg/dL (ref 0.2–1.0)
pH, UA: 6 (ref 5.0–7.5)

## 2021-08-18 MED ORDER — ACETAMINOPHEN-CODEINE #3 300-30 MG PO TABS: 1.0000 | ORAL_TABLET | ORAL | 0 refills | Status: DC | PRN

## 2021-08-18 NOTE — Assessment & Plan Note (Addendum)
UA reviewed ?Check urine culture ?Started Bactrim ?Referred to urology as she has had recurrent UTIs and has history of nephrolithiasis ?

## 2021-08-18 NOTE — Assessment & Plan Note (Signed)
Lab Results  Component Value Date   TSH 1.730 08/14/2021   Chart review does not show any history of elevated TSH or low free T4 Was on NP thyroid 60 mg daily, appeared to be oversupplemented, tapered NP thyroid TSH and free T4 WNL now  

## 2021-08-18 NOTE — Progress Notes (Signed)
Meds ordered this encounter  ?Medications  ? acetaminophen-codeine (TYLENOL #3) 300-30 MG tablet  ?  Sig: Take 1-2 tablets by mouth every 4 (four) hours as needed for moderate pain.  ?  Dispense:  12 tablet  ?  Refill:  0  ? ? ?

## 2021-08-18 NOTE — Assessment & Plan Note (Signed)
Has had ureteric stent placement in the past ?Referred to urology ?

## 2021-08-18 NOTE — Assessment & Plan Note (Signed)
Continue to follow low-carb diet and perform moderate exercise/walking as tolerated ?Had good response with GLP-1 agonist in the past ?Started Wegovy - initial BMI - 33.93 ?Plan to increase dose as tolerated ?

## 2021-08-18 NOTE — Telephone Encounter (Signed)
I called patient, told her that sometimes it does hurt some the next day. I suggested for her to use ice 20 minutes on and 20 minutes off. Take Tylenol for the pain and I would like to hear back on Monday from her to see how she is feeling. I explained to her that Dr. Luna Glasgow is out until Tuesday, but just touch base with me so we can see if he needs to advise more. Stated she would. ?

## 2021-08-18 NOTE — Assessment & Plan Note (Signed)
Well controlled with Emgality and as needed Imitrex ?Does not have a neurologist currently ?

## 2021-08-18 NOTE — Assessment & Plan Note (Signed)
Appears to be traumatic injury to the rotator cuff ?Referred to orthopedic surgery ?

## 2021-08-25 ENCOUNTER — Ambulatory Visit (HOSPITAL_COMMUNITY)
Admission: RE | Admit: 2021-08-25 | Discharge: 2021-08-25 | Disposition: A | Discharge status: Home / Self Care | Payer: BC Managed Care – PPO | Source: Ambulatory Visit | Attending: Urology | Admitting: Urology

## 2021-08-25 DIAGNOSIS — N200 Calculus of kidney: Secondary | ICD-10-CM

## 2021-08-25 DIAGNOSIS — N390 Urinary tract infection, site not specified: Secondary | ICD-10-CM | POA: Diagnosis present

## 2021-08-29 ENCOUNTER — Ambulatory Visit: Payer: BC Managed Care – PPO | Admitting: Urology

## 2021-08-29 NOTE — Progress Notes (Signed)
Copy faxed to PCP per Dr. Jeffie Pollock pt aware via mychart.  ?

## 2021-08-31 ENCOUNTER — Ambulatory Visit (HOSPITAL_COMMUNITY)
Admission: RE | Admit: 2021-08-31 | Discharge: 2021-08-31 | Disposition: A | Discharge status: Home / Self Care | Payer: BC Managed Care – PPO | Source: Ambulatory Visit | Attending: Orthopaedic Surgery | Admitting: Orthopaedic Surgery

## 2021-08-31 ENCOUNTER — Ambulatory Visit: Payer: BC Managed Care – PPO | Admitting: Orthopaedic Surgery

## 2021-08-31 DIAGNOSIS — M25511 Pain in right shoulder: Secondary | ICD-10-CM | POA: Diagnosis present

## 2021-09-05 ENCOUNTER — Encounter: Payer: Self-pay | Admitting: Orthopaedic Surgery

## 2021-09-05 ENCOUNTER — Ambulatory Visit: Payer: BC Managed Care – PPO | Admitting: Orthopaedic Surgery

## 2021-09-05 VITALS — Ht 66.0 in | Wt 210.0 lb

## 2021-09-05 DIAGNOSIS — M7530 Calcific tendinitis of unspecified shoulder: Secondary | ICD-10-CM | POA: Diagnosis not present

## 2021-09-05 NOTE — Progress Notes (Signed)
My shoulder hurts all the time. ? ?She continues to have pain in the right shoulder.  She had MRI which showed: ?IMPRESSION: ?Calcific tendinosis of the supraspinatus tendon with adjacent bursal ?sided fraying at the footprint and mild subacromial-subdeltoid ?bursitis. No high-grade or retracted cuff tear. No significant ?muscle atrophy. ?  ?I have explained the findings to her.  She would like to have something done for the shoulder.  She is tired of hurting.  I have mentioned PT/OT but she would like to see someone for possible surgery.  I will have her see Dr. [name]. She is agreeable to this. The injection did not help and she said made her worse. ? ?I have independently reviewed the MRI.   ? ? ?Right shoulder has good ROM with pain in the extremes.  NV intact.  Neck has full ROM ? ?Encounter Diagnosis  ?Name Primary?  ? Calcific bursitis of shoulder Yes  ? ?To see Dr. [name]. ? ?Call if any problem. ? ?Precautions discussed. ? ?Electronically Signed ?Sanjuana Kava, MD ?5/2/20238:24 AM ? ?

## 2021-09-12 ENCOUNTER — Ambulatory Visit: Payer: BC Managed Care – PPO | Admitting: Orthopedic Surgery

## 2021-09-12 DIAGNOSIS — M25511 Pain in right shoulder: Secondary | ICD-10-CM

## 2021-09-12 DIAGNOSIS — M7530 Calcific tendinitis of unspecified shoulder: Secondary | ICD-10-CM | POA: Diagnosis not present

## 2021-09-13 ENCOUNTER — Encounter: Payer: Self-pay | Admitting: Orthopedic Surgery

## 2021-09-13 NOTE — Progress Notes (Signed)
New Patient Visit ? ?Assessment: ?Julia Rangel is a 36 y.o. female with the following: ?1. Calcific bursitis of shoulder ?2. Right anterior shoulder pain ? ?Plan: ?Julia Rangel has chronic pain in her right shoulder.  This has been ongoing for several years.  She describes a recent fall, and since then, she has significant pain in the anterior shoulder.  She has difficulty with forward flexion beyond 90 degrees.  Provocative testing for the labrum and long head of the biceps tendon elicits significant pain.  We reviewed the MRI in clinic today which demonstrates a large calcium deposits overlying the footprint of the supraspinatus, with some underlying fraying of the tendon.  In addition, the MRI is not an arthrogram, but there appears to be a SLAP tear.  As a result of the chronicity of her symptoms, and the current overall dysfunction of her right shoulder, she is interested in pursuing surgery.  We discussed potential treatment options which would include arthroscopic surgery, debridement, evaluation of the rotator cuff tendons with removal of the calcium deposit, and likely biceps tenodesis.  There is a small chance that she may have to have an open biceps tenodesis. ? ?Risks and benefits of the surgery, including, but not limited to infection, bleeding, persistent pain, need for further surgery and more severe complications associated with anesthesia were discussed with the patient.  The patient has elected to proceed. ? ?She would like to proceed with surgery on her summer break, she is a Radio producer.  Case will be posted for November 09, 2021. ? ? ?Follow-up: ?Return for After surgery; DOS 11/09/2021. ? ?Subjective: ? ?Chief Complaint  ?Patient presents with  ? New Patient (Initial Visit)  ? Shoulder Pain  ?  Rt shoulder/ referred by Dr. Luna Glasgow ?"Frayed rotator cuff and calcium deposits" ?Discuss surgery  ? ? ?History of Present Illness: ?Julia Rangel is a 36 y.o. female who presents for evaluation of right  shoulder pain.  She has previously been evaluated by Dr. Luna Glasgow.  She has had pain in the right shoulder for several years.  More recently, she describes a fall.  Since then, she has had significant pain in the anterior aspect of her right shoulder.  She has difficulty with overhead motion.  She has obtained an MRI, which demonstrates a calcium deposits within the subacromial space, with associated fraying of the bursal side of the rotator cuff tendons.  She has tried medications as needed.  She is previously done physical therapy.  She has had injections in her right shoulder.  None of these have provided her with any sustained relief.  She is interested in surgery. ? ? ?Review of Systems: ?No fevers or chills ?No numbness or tingling ?No chest pain ?No shortness of breath ?No bowel or bladder dysfunction ?No GI distress ?No headaches ? ? ?Medical History: ? ?Past Medical History:  ?Diagnosis Date  ? Anemia 09/2019  ? Breast tumor 2009  ? L breast benign  ? Dysplasia of cervix   ? Family history of BRCA1 gene positive   ? Family history of breast cancer   ? Gestational diabetes   ? History of cesarean delivery 10/09/2016  ? X2 plans rpeat  ? Kidney stone 2011  ? Menorrhagia   ? Migraines   ? since age 65 per patient  ? Patient desires pregnancy 06/01/2013  ? PMDD (premenstrual dysphoric disorder)   ? PROZAC 20 MG BUT NONE WITH PREGNANCY  ? PONV (postoperative nausea and vomiting)   ? Vaginal Pap smear,  abnormal   ? ? ?Past Surgical History:  ?Procedure Laterality Date  ? BIOPSY N/A 05/04/2021  ? Procedure: BIOPSY;  Surgeon: Rogene Houston, MD;  Location: [facility];  Service: Endoscopy;  Laterality: N/A;  ? CESAREAN SECTION  2010  ? CESAREAN SECTION N/A 03/29/2014  ? Procedure: REPEAT CESAREAN SECTION;  Surgeon: Florian Buff, MD;  Location: [facility];  Service: Obstetrics;  Laterality: N/A;  ? CESAREAN SECTION WITH BILATERAL TUBAL LIGATION Bilateral 05/08/2017  ? Procedure: CESAREAN SECTION WITH BILATERAL TUBAL  LIGATION;  Surgeon: Jonnie Kind, MD;  Location: [facility];  Service: Obstetrics;  Laterality: Bilateral;  ? COLONOSCOPY N/A 05/04/2021  ? Procedure: COLONOSCOPY;  Surgeon: Rogene Houston, MD;  Location: [facility];  Service: Endoscopy;  Laterality: N/A;  1245  ? HYSTERECTOMY ABDOMINAL WITH SALPINGECTOMY N/A 09/15/2019  ? Procedure: HYSTERECTOMY ABDOMINAL and OPEN BILATERAL SALPINGECTOMY;  Surgeon: Jonnie Kind, MD;  Location: [facility];  Service: Gynecology;  Laterality: N/A;  ? KIDNEY STONE SURGERY  2010  ? stent  ? LEEP  2008  ? phylloid removed  2009  ? left breast  ? TONSILLECTOMY    ? WISDOM TOOTH EXTRACTION    ? ? ?Family History  ?Problem Relation Age of Onset  ? Diabetes Maternal Grandmother   ? Breast cancer Maternal Grandmother   ?     dx 40s-50  ? Diabetes Paternal Grandmother   ? Breast cancer Paternal Grandmother 65  ? COPD Paternal Grandfather   ? Heart disease Maternal Grandfather   ? Breast cancer Mother 53  ? BRCA 1/2 Mother   ?     BRCA1 pos  ? Ovarian cancer Mother   ? Other Mother   ?     peritoneal cancer  ? Colon polyps Maternal Uncle   ? SIDS Maternal Aunt   ?     2 babies died of SIDS at 46 months  ? ?Social History  ? ?Tobacco Use  ? Smoking status: Never  ? Smokeless tobacco: Never  ?Vaping Use  ? Vaping Use: Never used  ?Substance Use Topics  ? Alcohol use: Yes  ?  Alcohol/week: 7.0 standard drinks  ?  Types: 7 Glasses of wine per week  ? Drug use: No  ? ? ?No Known Allergies ? ?Current Meds  ?Medication Sig  ? acetaminophen-codeine (TYLENOL #3) 300-30 MG tablet Take 1-2 tablets by mouth every 4 (four) hours as needed for moderate pain.  ? COLLAGEN PO Take 2 each by mouth daily. Gummies  ? EMGALITY 120 MG/ML SOSY Inject 120 mg into the skin every 30 (thirty) days.  ? FLUoxetine (PROZAC) 20 MG tablet Take 1 tablet by mouth once daily  ? FLUoxetine (PROZAC) 40 MG capsule Take 1 capsule by mouth once daily  ? Semaglutide-Weight Management 0.5 MG/0.5ML SOAJ Inject 0.5 mg into  the skin once a week for 28 days.  ? [START ON 10/11/2021] Semaglutide-Weight Management 1 MG/0.5ML SOAJ Inject 1 mg into the skin once a week for 28 days.  ? [START ON 11/09/2021] Semaglutide-Weight Management 1.7 MG/0.75ML SOAJ Inject 1.7 mg into the skin once a week for 28 days.  ? [START ON 12/08/2021] Semaglutide-Weight Management 2.4 MG/0.75ML SOAJ Inject 2.4 mg into the skin once a week for 28 days.  ? sulfamethoxazole-trimethoprim (BACTRIM DS) 800-160 MG tablet Take 1 tablet by mouth 2 (two) times daily.  ? SUMAtriptan (IMITREX) 100 MG tablet Take 100 mg by mouth every 2 (two) hours as needed for  migraine. May repeat in 2 hours if headache persists or recurs.  ? trimethoprim (TRIMPEX) 100 MG tablet Take 1 tablet (100 mg total) by mouth at bedtime.  ? ? ?Objective: ?LMP 07/14/2019 (Approximate)  ? ?Physical Exam: ? ?General: Alert and oriented. and No acute distress. ?Gait: Normal gait. ? ?Right shoulder without deformity.  Active forward flexion to 90 degrees, with obvious discomfort.  She has tenderness to palpation over the anterior shoulder.  Mild discomfort in the empty can testing position.  Positive O'Brien's testing.  Exquisite pain in the speeds testing position.  Passively I can range her to 120 degrees of forward flexion, but this is very uncomfortable for her.  Fingers are warm and well-perfused. ? ?IMAGING: ? ? ?Right shoulder MRI. ? ? ?IMPRESSION: ?Calcific tendinosis of the supraspinatus tendon with adjacent bursal ?sided fraying at the footprint and mild subacromial-subdeltoid ?bursitis. No high-grade or retracted cuff tear. No significant ?muscle atrophy. ? ?New Medications:  ?No orders of the defined types were placed in this encounter. ? ? ? ? ?Mordecai Rasmussen, MD ? ?09/13/2021 ?9:13 AM ? ? ?

## 2021-09-14 ENCOUNTER — Ambulatory Visit: Payer: Self-pay | Admitting: Orthopedic Surgery

## 2021-09-14 DIAGNOSIS — M25511 Pain in right shoulder: Secondary | ICD-10-CM

## 2021-09-14 DIAGNOSIS — M7530 Calcific tendinitis of unspecified shoulder: Secondary | ICD-10-CM

## 2021-09-14 DIAGNOSIS — Z01818 Encounter for other preprocedural examination: Secondary | ICD-10-CM

## 2021-10-10 ENCOUNTER — Other Ambulatory Visit: Payer: Self-pay | Admitting: Internal Medicine

## 2021-10-10 DIAGNOSIS — E669 Obesity, unspecified: Secondary | ICD-10-CM

## 2021-11-02 NOTE — Patient Instructions (Signed)
Your procedure is scheduled on: 11/09/2021  Report to [place] [facility] at  6:00   AM.  Call this number if you have problems the morning of surgery: [phone]   Remember:   Do not Eat after midnight you may have clear liquids until 3:30 am   Drink 1 Carb loading drink before 3:30 am        No Smoking the morning of surgery  :  Take these medicines the morning of surgery with A SIP OF WATER: Prozac   Do not wear jewelry, make-up or nail polish.  Do not wear lotions, powders, or perfumes. You may wear deodorant.  Do not shave 48 hours prior to surgery. Men may shave face and neck.  Do not bring valuables to the hospital.  Contacts, dentures or bridgework may not be worn into surgery.  Leave suitcase in the car. After surgery it may be brought to your room.  For patients admitted to the hospital, checkout time is 11:00 AM the day of discharge.   Patients discharged the day of surgery will not be allowed to drive home.    Special Instructions: Shower using CHG night before surgery and shower the day of surgery use CHG.  Use special wash - you have one bottle of CHG for all showers.  You should use approximately 1/2 of the bottle for each shower.  How to Use Chlorhexidine for Bathing Chlorhexidine gluconate (CHG) is a germ-killing (antiseptic) solution that is used to clean the skin. It can get rid of the bacteria that normally live on the skin and can keep them away for about 24 hours. To clean your skin with CHG, you may be given: A CHG solution to use in the shower or as part of a sponge bath. A prepackaged cloth that contains CHG. Cleaning your skin with CHG may help lower the risk for infection: While you are staying in the intensive care unit of the hospital. If you have a vascular access, such as a central line, to provide short-term or long-term access to your veins. If you have a catheter to drain urine from your bladder. If you are on a ventilator. A ventilator is a  machine that helps you breathe by moving air in and out of your lungs. After surgery. What are the risks? Risks of using CHG include: A skin reaction. Hearing loss, if CHG gets in your ears and you have a perforated eardrum. Eye injury, if CHG gets in your eyes and is not rinsed out. The CHG product catching fire. Make sure that you avoid smoking and flames after applying CHG to your skin. Do not use CHG: If you have a chlorhexidine allergy or have previously reacted to chlorhexidine. On babies younger than 78 months of age. How to use CHG solution Use CHG only as told by your health care provider, and follow the instructions on the label. Use the full amount of CHG as directed. Usually, this is one bottle. During a shower Follow these steps when using CHG solution during a shower (unless your health care provider gives you different instructions): Start the shower. Use your normal soap and shampoo to wash your face and hair. Turn off the shower or move out of the shower stream. Pour the CHG onto a clean washcloth. Do not use any type of brush or rough-edged sponge. Starting at your neck, lather your body down to your toes. Make sure you follow these instructions: If you will be having surgery, pay  special attention to the part of your body where you will be having surgery. Scrub this area for at least 1 minute. Do not use CHG on your head or face. If the solution gets into your ears or eyes, rinse them well with water. Avoid your genital area. Avoid any areas of skin that have broken skin, cuts, or scrapes. Scrub your back and under your arms. Make sure to wash skin folds. Let the lather sit on your skin for 1-2 minutes or as long as told by your health care provider. Thoroughly rinse your entire body in the shower. Make sure that all body creases and crevices are rinsed well. Dry off with a clean towel. Do not put any substances on your body afterward--such as powder, lotion, or  perfume--unless you are told to do so by your health care provider. Only use lotions that are recommended by the manufacturer. Put on clean clothes or pajamas. If it is the night before your surgery, sleep in clean sheets.  During a sponge bath Follow these steps when using CHG solution during a sponge bath (unless your health care provider gives you different instructions): Use your normal soap and shampoo to wash your face and hair. Pour the CHG onto a clean washcloth. Starting at your neck, lather your body down to your toes. Make sure you follow these instructions: If you will be having surgery, pay special attention to the part of your body where you will be having surgery. Scrub this area for at least 1 minute. Do not use CHG on your head or face. If the solution gets into your ears or eyes, rinse them well with water. Avoid your genital area. Avoid any areas of skin that have broken skin, cuts, or scrapes. Scrub your back and under your arms. Make sure to wash skin folds. Let the lather sit on your skin for 1-2 minutes or as long as told by your health care provider. Using a different clean, wet washcloth, thoroughly rinse your entire body. Make sure that all body creases and crevices are rinsed well. Dry off with a clean towel. Do not put any substances on your body afterward--such as powder, lotion, or perfume--unless you are told to do so by your health care provider. Only use lotions that are recommended by the manufacturer. Put on clean clothes or pajamas. If it is the night before your surgery, sleep in clean sheets. How to use CHG prepackaged cloths Only use CHG cloths as told by your health care provider, and follow the instructions on the label. Use the CHG cloth on clean, dry skin. Do not use the CHG cloth on your head or face unless your health care provider tells you to. When washing with the CHG cloth: Avoid your genital area. Avoid any areas of skin that have broken  skin, cuts, or scrapes. Before surgery Follow these steps when using a CHG cloth to clean before surgery (unless your health care provider gives you different instructions): Using the CHG cloth, vigorously scrub the part of your body where you will be having surgery. Scrub using a back-and-forth motion for 3 minutes. The area on your body should be completely wet with CHG when you are done scrubbing. Do not rinse. Discard the cloth and let the area air-dry. Do not put any substances on the area afterward, such as powder, lotion, or perfume. Put on clean clothes or pajamas. If it is the night before your surgery, sleep in clean sheets.  For general bathing  Follow these steps when using CHG cloths for general bathing (unless your health care provider gives you different instructions). Use a separate CHG cloth for each area of your body. Make sure you wash between any folds of skin and between your fingers and toes. Wash your body in the following order, switching to a new cloth after each step: The front of your neck, shoulders, and chest. Both of your arms, under your arms, and your hands. Your stomach and groin area, avoiding the genitals. Your right leg and foot. Your left leg and foot. The back of your neck, your back, and your buttocks. Do not rinse. Discard the cloth and let the area air-dry. Do not put any substances on your body afterward--such as powder, lotion, or perfume--unless you are told to do so by your health care provider. Only use lotions that are recommended by the manufacturer. Put on clean clothes or pajamas. Contact a health care provider if: Your skin gets irritated after scrubbing. You have questions about using your solution or cloth. You swallow any chlorhexidine. Call your local poison control center (1-5313042500 in the [place] Get help right away if: Your eyes itch badly, or they become very red or swollen. Your skin itches badly and is red or swollen. Your  hearing changes. You have trouble seeing. You have swelling or tingling in your mouth or throat. You have trouble breathing. These symptoms may represent a serious problem that is an emergency. Do not wait to see if the symptoms will go away. Get medical help right away. Call your local emergency services (911 in the [place] Do not drive yourself to the hospital. Summary Chlorhexidine gluconate (CHG) is a germ-killing (antiseptic) solution that is used to clean the skin. Cleaning your skin with CHG may help to lower your risk for infection. You may be given CHG to use for bathing. It may be in a bottle or in a prepackaged cloth to use on your skin. Carefully follow your health care provider's instructions and the instructions on the product label. Do not use CHG if you have a chlorhexidine allergy. Contact your health care provider if your skin gets irritated after scrubbing. This information is not intended to replace advice given to you by your health care provider. Make sure you discuss any questions you have with your health care provider. Document Revised: 07/04/2020 Document Reviewed: 07/04/2020 Elsevier Patient Education  [place]. Shoulder Arthroscopy, Care After The following information offers guidance on how to care for yourself after your procedure. Your health care provider may also give you more specific instructions. If you have problems or questions, contact your health care provider. What can I expect after the procedure? After the procedure, it is common to have: Pain. Swelling. A small amount of fluid from the incision. Stiffness that improves over time. Follow these instructions at home: If you have a sling or an immobilizer: Wear it as told by your health care provider. Remove it only as told by your health care provider. These devices protect your shoulder and help it heal by keeping it in place. Check the skin around it every day. Tell your health care provider  about any concerns. Loosen it if your fingers tingle, become numb, or turn cold and blue. Keep the sling or immobilizer clean. If it is not waterproof: Do not let it get wet. Cover it with a watertight covering when you take a bath or a shower. Incision care  Follow instructions from your health care provider  about how to take care of your incisions. Make sure you: Wash your hands with soap and water for at least 20 seconds before and after you change your bandage (dressing). If soap and water are not available, use hand sanitizer. Change your dressing as told by your health care provider. Leave stitches (sutures), skin glue, or adhesive strips in place. These skin closures may need to stay in place for 2 weeks or longer. If adhesive strip edges start to loosen and curl up, you may trim the loose edges. Do not remove adhesive strips completely unless your health care provider tells you to do that. Check your incision areas every day for signs of infection. Check for: Redness. More swelling or pain. Blood or more fluid. Warmth. Pus or a bad smell. Do not take baths, swim, or use a hot tub until your health care provider approves. Ask your health care provider if you may take showers. You may only be allowed to take sponge baths. Managing pain, stiffness, and swelling  If directed, put ice on the affected area. To do this: If you have a removable sling or immobilizer, remove it as told by your health care provider. Put ice in a plastic bag or use the icing device (cold therapy unit) that you were given. Follow instructions from your health care provider about how to use the icing device. Place a towel between your skin and the bag or between your skin and the icing device. Leave the ice on for 20 minutes, 2-3 times a day. Remove the ice if your skin turns bright red. This is very important. If you cannot feel pain, heat, or cold, you have a greater risk of damage to the area. Move your  fingers often to reduce stiffness and swelling. Raise (elevate) the injured area above the level of your heart while you are lying down. It may help to sleep in a sitting position for a few days after your procedure. Try sleeping in a reclining chair or propping yourself up with extra pillows in bed. Activity Ask your health care provider what activities are safe for you during recovery. Do not lift with your affected shoulder until your health care provider approves. Avoid pulling and pushing with the arm on your affected side. If physical therapy was prescribed, do exercises as directed. Doing exercises may help to improve shoulder movement and flexibility (range of motion). Driving Ask your health care provider when it is safe to drive if you have a sling or immobilizer. Ask your health care provider if the medicine prescribed to you requires you to avoid driving or using machinery. General instructions Take over-the-counter and prescription medicines only as told by your health care provider. Ask your health care provider if the medicine prescribed to you can cause constipation. You may need to take these actions to prevent or treat constipation: Drink enough fluid to keep your urine pale yellow. Take over-the-counter or prescription medicines. Eat foods that are high in fiber, such as beans, whole grains, and fresh fruits and vegetables. Limit foods that are high in fat and processed sugars, such as fried or sweet foods. Do not use any products that contain nicotine or tobacco. These products include cigarettes, chewing tobacco, and vaping devices, such as e-cigarettes. These can delay incision healing after surgery. If you need help quitting, ask your health care provider. Keep all follow-up visits. This is important. Contact a health care provider if: You have a fever or chills. You have severe pain. You  have any of these signs of infection: Redness around an incision. More swelling  or pain in an incision area. Blood or more fluid coming from an incision. Warmth coming from an incision. Pus or a bad smell coming from an incision. You notice that an incision has opened up. You develop a rash. Get help right away if: You have difficulty breathing. You have chest pain. You notice that your fingers tingle, are numb, or are cold and blue even after you loosen your sling or immobilizer. You develop pain in your lower leg or at the back of your knee. These symptoms may represent a serious problem that is an emergency. Do not wait to see if the symptoms will go away. Get medical help right away. Call your local emergency services (911 in the [place] Do not drive yourself to the hospital. Summary If you have a sling or an immobilizer, wear it as told by your health care provider. It may help to sleep in a sitting position for a few days after your procedure. If physical therapy was prescribed, do exercises as directed. Doing exercises may help to improve shoulder movement and flexibility (range of motion). Keep all follow-up visits. This is important. This information is not intended to replace advice given to you by your health care provider. Make sure you discuss any questions you have with your health care provider. Document Revised: 12/21/2019 Document Reviewed: 12/21/2019 Elsevier Patient Education  [place] Anesthesia, Adult, Care After This sheet gives you information about how to care for yourself after your procedure. Your health care provider may also give you more specific instructions. If you have problems or questions, contact your health care provider. What can I expect after the procedure? After the procedure, the following side effects are common: Pain or discomfort at the IV site. Nausea. Vomiting. Sore throat. Trouble concentrating. Feeling cold or chills. Feeling weak or tired. Sleepiness and fatigue. Soreness and body aches. These  side effects can affect parts of the body that were not involved in surgery. Follow these instructions at home: For the time period you were told by your health care provider:  Rest. Do not participate in activities where you could fall or become injured. Do not drive or use machinery. Do not drink alcohol. Do not take sleeping pills or medicines that cause drowsiness. Do not make important decisions or sign legal documents. Do not take care of children on your own. Eating and drinking Follow any instructions from your health care provider about eating or drinking restrictions. When you feel hungry, start by eating small amounts of foods that are soft and easy to digest (bland), such as toast. Gradually return to your regular diet. Drink enough fluid to keep your urine pale yellow. If you vomit, rehydrate by drinking water, juice, or clear broth. General instructions If you have sleep apnea, surgery and certain medicines can increase your risk for breathing problems. Follow instructions from your health care provider about wearing your sleep device: Anytime you are sleeping, including during daytime naps. While taking prescription pain medicines, sleeping medicines, or medicines that make you drowsy. Have a responsible adult stay with you for the time you are told. It is important to have someone help care for you until you are awake and alert. Return to your normal activities as told by your health care provider. Ask your health care provider what activities are safe for you. Take over-the-counter and prescription medicines only as told by your health care provider.  If you smoke, do not smoke without supervision. Keep all follow-up visits as told by your health care provider. This is important. Contact a health care provider if: You have nausea or vomiting that does not get better with medicine. You cannot eat or drink without vomiting. You have pain that does not get better with  medicine. You are unable to pass urine. You develop a skin rash. You have a fever. You have redness around your IV site that gets worse. Get help right away if: You have difficulty breathing. You have chest pain. You have blood in your urine or stool, or you vomit blood. Summary After the procedure, it is common to have a sore throat or nausea. It is also common to feel tired. Have a responsible adult stay with you for the time you are told. It is important to have someone help care for you until you are awake and alert. When you feel hungry, start by eating small amounts of foods that are soft and easy to digest (bland), such as toast. Gradually return to your regular diet. Drink enough fluid to keep your urine pale yellow. Return to your normal activities as told by your health care provider. Ask your health care provider what activities are safe for you. This information is not intended to replace advice given to you by your health care provider. Make sure you discuss any questions you have with your health care provider. Document Revised: 01/07/2020 Document Reviewed: 08/06/2019 Elsevier Patient Education  [place].

## 2021-11-06 ENCOUNTER — Encounter (HOSPITAL_COMMUNITY)
Admission: RE | Admit: 2021-11-06 | Discharge: 2021-11-06 | Disposition: A | Discharge status: Home / Self Care | Payer: BC Managed Care – PPO | Source: Ambulatory Visit | Attending: Orthopedic Surgery | Admitting: Orthopedic Surgery

## 2021-11-06 DIAGNOSIS — E039 Hypothyroidism, unspecified: Secondary | ICD-10-CM | POA: Diagnosis not present

## 2021-11-06 DIAGNOSIS — S43431A Superior glenoid labrum lesion of right shoulder, initial encounter: Secondary | ICD-10-CM | POA: Diagnosis present

## 2021-11-06 DIAGNOSIS — Z01812 Encounter for preprocedural laboratory examination: Secondary | ICD-10-CM | POA: Insufficient documentation

## 2021-11-06 DIAGNOSIS — W19XXXA Unspecified fall, initial encounter: Secondary | ICD-10-CM | POA: Diagnosis not present

## 2021-11-06 DIAGNOSIS — M7531 Calcific tendinitis of right shoulder: Secondary | ICD-10-CM | POA: Diagnosis not present

## 2021-11-06 DIAGNOSIS — Z01818 Encounter for other preprocedural examination: Secondary | ICD-10-CM

## 2021-11-06 DIAGNOSIS — Z79899 Other long term (current) drug therapy: Secondary | ICD-10-CM | POA: Diagnosis not present

## 2021-11-06 DIAGNOSIS — E119 Type 2 diabetes mellitus without complications: Secondary | ICD-10-CM | POA: Diagnosis not present

## 2021-11-06 LAB — BASIC METABOLIC PANEL
Anion gap: 6 (ref 5–15)
BUN: 14 mg/dL (ref 6–20)
CO2: 24 mmol/L (ref 22–32)
Calcium: 9.5 mg/dL (ref 8.9–10.3)
Chloride: 108 mmol/L (ref 98–111)
Creatinine, Ser: 0.74 mg/dL (ref 0.44–1.00)
GFR, Estimated: >60 mL/min (ref >60)
Glucose, Bld: 90 mg/dL (ref 70–99)
Potassium: 3.6 mmol/L (ref 3.5–5.1)
Sodium: 138 mmol/L (ref 135–145)

## 2021-11-06 LAB — CBC
HCT: 37.9 % (ref 36.0–46.0)
Hemoglobin: 13.2 g/dL (ref 12.0–15.0)
MCH: 31.6 pg (ref 26.0–34.0)
MCHC: 34.8 g/dL (ref 30.0–36.0)
MCV: 90.7 fL (ref 80.0–100.0)
Platelets: 262 10*3/uL (ref 150–400)
RBC: 4.18 MIL/uL (ref 3.87–5.11)
RDW: 11.9 % (ref 11.5–15.5)
WBC: 6.4 10*3/uL (ref 4.0–10.5)
nRBC: 0 % (ref 0.0–0.2)

## 2021-11-09 ENCOUNTER — Other Ambulatory Visit: Payer: Self-pay

## 2021-11-09 ENCOUNTER — Ambulatory Visit (HOSPITAL_COMMUNITY): Payer: BC Managed Care – PPO | Admitting: Anesthesiology

## 2021-11-09 ENCOUNTER — Encounter (HOSPITAL_COMMUNITY)
Admission: RE | Disposition: A | Discharge status: Home / Self Care | Payer: Self-pay | Source: Home / Self Care | Attending: Orthopedic Surgery

## 2021-11-09 ENCOUNTER — Ambulatory Visit (HOSPITAL_COMMUNITY)
Admission: RE | Admit: 2021-11-09 | Discharge: 2021-11-09 | Disposition: A | Discharge status: Home / Self Care | Payer: BC Managed Care – PPO | Attending: Orthopedic Surgery | Admitting: Orthopedic Surgery

## 2021-11-09 ENCOUNTER — Encounter (HOSPITAL_COMMUNITY): Payer: Self-pay | Admitting: Orthopedic Surgery

## 2021-11-09 DIAGNOSIS — G8929 Other chronic pain: Secondary | ICD-10-CM

## 2021-11-09 DIAGNOSIS — E119 Type 2 diabetes mellitus without complications: Secondary | ICD-10-CM | POA: Insufficient documentation

## 2021-11-09 DIAGNOSIS — E039 Hypothyroidism, unspecified: Secondary | ICD-10-CM | POA: Insufficient documentation

## 2021-11-09 DIAGNOSIS — Z79899 Other long term (current) drug therapy: Secondary | ICD-10-CM | POA: Insufficient documentation

## 2021-11-09 DIAGNOSIS — M7531 Calcific tendinitis of right shoulder: Secondary | ICD-10-CM | POA: Diagnosis not present

## 2021-11-09 DIAGNOSIS — S43431A Superior glenoid labrum lesion of right shoulder, initial encounter: Secondary | ICD-10-CM | POA: Insufficient documentation

## 2021-11-09 DIAGNOSIS — W19XXXA Unspecified fall, initial encounter: Secondary | ICD-10-CM | POA: Insufficient documentation

## 2021-11-09 HISTORY — PX: SHOULDER ARTHROSCOPY WITH DEBRIDEMENT AND BICEP TENDON REPAIR: SHX5690

## 2021-11-09 HISTORY — PX: BICEPT TENODESIS: SHX5116

## 2021-11-09 SURGERY — SHOULDER ARTHROSCOPY WITH DEBRIDEMENT AND BICEP TENDON REPAIR
Anesthesia: General | Site: Shoulder | Laterality: Right

## 2021-11-09 MED ORDER — BUPIVACAINE HCL (PF) 0.5 % IJ SOLN
INTRAMUSCULAR | Status: DC | PRN
  Administered 2021-11-09: 28 mL via PERINEURAL

## 2021-11-09 MED ORDER — SCOPOLAMINE 1 MG/3DAYS TD PT72
MEDICATED_PATCH | TRANSDERMAL | Status: AC
  Filled 2021-11-09: qty 1

## 2021-11-09 MED ORDER — MIDAZOLAM HCL 5 MG/5ML IJ SOLN
INTRAMUSCULAR | Status: DC | PRN
  Administered 2021-11-09: 1 mg via INTRAVENOUS

## 2021-11-09 MED ORDER — FENTANYL CITRATE (PF) 250 MCG/5ML IJ SOLN
INTRAMUSCULAR | Status: AC
  Filled 2021-11-09: qty 5

## 2021-11-09 MED ORDER — BUPIVACAINE HCL (PF) 0.5 % IJ SOLN
INTRAMUSCULAR | Status: AC
  Filled 2021-11-09: qty 30

## 2021-11-09 MED ORDER — ONDANSETRON HCL 4 MG/2ML IJ SOLN
INTRAMUSCULAR | Status: DC | PRN
  Administered 2021-11-09: 4 mg via INTRAVENOUS

## 2021-11-09 MED ORDER — SEVOFLURANE IN SOLN
RESPIRATORY_TRACT | Status: AC
  Filled 2021-11-09: qty 250

## 2021-11-09 MED ORDER — PHENYLEPHRINE 80 MCG/ML (10ML) SYRINGE FOR IV PUSH (FOR BLOOD PRESSURE SUPPORT)
PREFILLED_SYRINGE | INTRAVENOUS | Status: AC
  Filled 2021-11-09: qty 10

## 2021-11-09 MED ORDER — PHENYLEPHRINE HCL-NACL 20-0.9 MG/250ML-% IV SOLN
INTRAVENOUS | Status: DC | PRN
  Administered 2021-11-09: 50 ug/min via INTRAVENOUS

## 2021-11-09 MED ORDER — GLYCOPYRROLATE PF 0.2 MG/ML IJ SOSY
PREFILLED_SYRINGE | INTRAMUSCULAR | Status: AC
  Filled 2021-11-09: qty 1

## 2021-11-09 MED ORDER — OXYCODONE HCL 5 MG PO TABS: 5.0000 mg | ORAL_TABLET | ORAL | 0 refills | Status: AC | PRN

## 2021-11-09 MED ORDER — ONDANSETRON HCL 4 MG/2ML IJ SOLN
INTRAMUSCULAR | Status: AC
  Filled 2021-11-09: qty 2

## 2021-11-09 MED ORDER — MEPERIDINE HCL 50 MG/ML IJ SOLN: 6.2500 mg | INTRAMUSCULAR | Status: DC | PRN

## 2021-11-09 MED ORDER — CELECOXIB 100 MG PO CAPS: 100.0000 mg | ORAL_CAPSULE | Freq: Every day | ORAL | 0 refills | Status: AC

## 2021-11-09 MED ORDER — DEXAMETHASONE SODIUM PHOSPHATE 4 MG/ML IJ SOLN
INTRAMUSCULAR | Status: DC | PRN
  Administered 2021-11-09: 8 mg via INTRAVENOUS

## 2021-11-09 MED ORDER — ONDANSETRON HCL 4 MG PO TABS: 4.0000 mg | ORAL_TABLET | Freq: Three times a day (TID) | ORAL | 0 refills | Status: AC | PRN

## 2021-11-09 MED ORDER — DEXAMETHASONE SODIUM PHOSPHATE 10 MG/ML IJ SOLN
INTRAMUSCULAR | Status: AC
  Filled 2021-11-09: qty 1

## 2021-11-09 MED ORDER — EPINEPHRINE PF 1 MG/ML IJ SOLN
INTRAMUSCULAR | Status: AC
  Filled 2021-11-09: qty 8

## 2021-11-09 MED ORDER — ORAL CARE MOUTH RINSE: 15.0000 mL | Freq: Once | OROMUCOSAL | Status: AC

## 2021-11-09 MED ORDER — BUPIVACAINE LIPOSOME 1.3 % IJ SUSP
INTRAMUSCULAR | Status: AC
  Filled 2021-11-09: qty 10

## 2021-11-09 MED ORDER — MIDAZOLAM HCL 2 MG/2ML IJ SOLN
INTRAMUSCULAR | Status: AC
  Filled 2021-11-09: qty 2

## 2021-11-09 MED ORDER — SUGAMMADEX SODIUM 200 MG/2ML IV SOLN
INTRAVENOUS | Status: DC | PRN
  Administered 2021-11-09: 200 mg via INTRAVENOUS

## 2021-11-09 MED ORDER — BUPIVACAINE-EPINEPHRINE (PF) 0.5% -1:200000 IJ SOLN
INTRAMUSCULAR | Status: AC
  Filled 2021-11-09: qty 30

## 2021-11-09 MED ORDER — LIDOCAINE HCL (PF) 2 % IJ SOLN
INTRAMUSCULAR | Status: AC
  Filled 2021-11-09: qty 5

## 2021-11-09 MED ORDER — PHENYLEPHRINE HCL-NACL 20-0.9 MG/250ML-% IV SOLN
INTRAVENOUS | Status: AC
  Filled 2021-11-09: qty 250

## 2021-11-09 MED ORDER — DEXAMETHASONE SODIUM PHOSPHATE 4 MG/ML IJ SOLN
INTRAMUSCULAR | Status: AC
  Filled 2021-11-09: qty 2

## 2021-11-09 MED ORDER — ASPIRIN 81 MG PO TBEC: 81.0000 mg | DELAYED_RELEASE_TABLET | Freq: Two times a day (BID) | ORAL | 0 refills | Status: AC

## 2021-11-09 MED ORDER — HYDROMORPHONE HCL 1 MG/ML IJ SOLN: 0.2500 mg | INTRAMUSCULAR | Status: DC | PRN

## 2021-11-09 MED ORDER — PROPOFOL 10 MG/ML IV BOLUS
INTRAVENOUS | Status: AC
  Filled 2021-11-09: qty 20

## 2021-11-09 MED ORDER — ACETAMINOPHEN 500 MG PO TABS: 1000.0000 mg | ORAL_TABLET | Freq: Three times a day (TID) | ORAL | 0 refills | Status: AC

## 2021-11-09 MED ORDER — CEFAZOLIN SODIUM-DEXTROSE 2-4 GM/100ML-% IV SOLN
2.0000 g | INTRAVENOUS | Status: AC
  Administered 2021-11-09: 2 g via INTRAVENOUS
  Filled 2021-11-09: qty 100

## 2021-11-09 MED ORDER — LACTATED RINGERS IV SOLN: INTRAVENOUS | Status: DC

## 2021-11-09 MED ORDER — CHLORHEXIDINE GLUCONATE 0.12 % MT SOLN
15.0000 mL | Freq: Once | OROMUCOSAL | Status: AC
  Administered 2021-11-09: 15 mL via OROMUCOSAL
  Filled 2021-11-09: qty 15

## 2021-11-09 MED ORDER — ONDANSETRON HCL 4 MG/2ML IJ SOLN: 4.0000 mg | Freq: Once | INTRAMUSCULAR | Status: DC | PRN

## 2021-11-09 MED ORDER — DEXAMETHASONE SODIUM PHOSPHATE 10 MG/ML IJ SOLN
INTRAMUSCULAR | Status: DC | PRN
  Administered 2021-11-09: 4 mg via INTRAVENOUS

## 2021-11-09 MED ORDER — FENTANYL CITRATE (PF) 250 MCG/5ML IJ SOLN
INTRAMUSCULAR | Status: DC | PRN
  Administered 2021-11-09 (×2): 50 ug via INTRAVENOUS

## 2021-11-09 MED ORDER — ROCURONIUM BROMIDE 10 MG/ML (PF) SYRINGE
PREFILLED_SYRINGE | INTRAVENOUS | Status: DC | PRN
  Administered 2021-11-09: 80 mg via INTRAVENOUS
  Administered 2021-11-09: 10 mg via INTRAVENOUS

## 2021-11-09 MED ORDER — SODIUM CHLORIDE 0.9 % IR SOLN
Status: DC | PRN
  Administered 2021-11-09: 1000 mL

## 2021-11-09 MED ORDER — LIDOCAINE HCL (PF) 1 % IJ SOLN
INTRAMUSCULAR | Status: AC
  Filled 2021-11-09: qty 30

## 2021-11-09 MED ORDER — SCOPOLAMINE 1 MG/3DAYS TD PT72
1.0000 | MEDICATED_PATCH | Freq: Once | TRANSDERMAL | Status: DC
  Administered 2021-11-09: 1.5 mg via TRANSDERMAL

## 2021-11-09 MED ORDER — ROCURONIUM BROMIDE 10 MG/ML (PF) SYRINGE
PREFILLED_SYRINGE | INTRAVENOUS | Status: AC
  Filled 2021-11-09: qty 10

## 2021-11-09 MED ORDER — LIDOCAINE HCL (PF) 1 % IJ SOLN
INTRAMUSCULAR | Status: DC | PRN
  Administered 2021-11-09: 2 mL

## 2021-11-09 MED ORDER — SODIUM CHLORIDE 0.9 % IR SOLN
Status: DC | PRN
  Administered 2021-11-09 (×6): 3000 mL

## 2021-11-09 MED ORDER — SUGAMMADEX SODIUM 500 MG/5ML IV SOLN
INTRAVENOUS | Status: AC
  Filled 2021-11-09: qty 20

## 2021-11-09 MED ORDER — PHENYLEPHRINE HCL (PRESSORS) 10 MG/ML IV SOLN
INTRAVENOUS | Status: DC | PRN
  Administered 2021-11-09 (×2): 80 ug via INTRAVENOUS
  Administered 2021-11-09: 160 ug via INTRAVENOUS
  Administered 2021-11-09 (×2): 80 ug via INTRAVENOUS

## 2021-11-09 MED ORDER — PROPOFOL 10 MG/ML IV BOLUS
INTRAVENOUS | Status: DC | PRN
  Administered 2021-11-09: 200 mg via INTRAVENOUS

## 2021-11-09 SURGICAL SUPPLY — 72 items
ANCH SUT SWLK 19.1X4.75 (Anchor) ×1 IMPLANT
ANCHOR SUT BIO SW 4.75X19.1 (Anchor) ×1 IMPLANT
APL PRP STRL LF DISP 70% ISPRP (MISCELLANEOUS) ×1
BANDAGE ESMARK 4X12 BL STRL LF (DISPOSABLE) ×1 IMPLANT
BLADE SURG SZ10 CARB STEEL (BLADE) ×2 IMPLANT
BLADE SURG SZ11 CARB STEEL (BLADE) ×1 IMPLANT
BNDG CMPR 12X4 ELC STRL LF (DISPOSABLE) ×1
BNDG CMPR STD VLCR NS LF 5.8X4 (GAUZE/BANDAGES/DRESSINGS) ×1
BNDG COHESIVE 4X5 TAN STRL (GAUZE/BANDAGES/DRESSINGS) ×2 IMPLANT
BNDG ELASTIC 4X5.8 VLCR NS LF (GAUZE/BANDAGES/DRESSINGS) ×2 IMPLANT
BNDG ESMARK 4X12 BLUE STRL LF (DISPOSABLE) ×2
BNDG GAUZE DERMACEA FLUFF (GAUZE/BANDAGES/DRESSINGS) ×2
BNDG GAUZE DERMACEA FLUFF 4 (GAUZE/BANDAGES/DRESSINGS) IMPLANT
BNDG GZE DERMACEA 4 6PLY (GAUZE/BANDAGES/DRESSINGS) ×2
CANNULA 8.25X9 (CANNULA) ×1 IMPLANT
CHLORAPREP W/TINT 26 (MISCELLANEOUS) ×2 IMPLANT
CLOTH BEACON ORANGE TIMEOUT ST (SAFETY) ×2 IMPLANT
COVER LIGHT HANDLE STERIS (MISCELLANEOUS) ×4 IMPLANT
COVER MAYO STAND XLG (MISCELLANEOUS) ×1 IMPLANT
CUTTER BONE 4.0MM X 13CM (MISCELLANEOUS) ×1 IMPLANT
DRAPE INCISE IOBAN 66X45 STRL (DRAPES) ×1 IMPLANT
DRAPE ORTHO 2.5IN SPLIT 77X108 (DRAPES) IMPLANT
DRAPE ORTHO SPLIT 77X108 STRL (DRAPES) ×2
DRAPE SHOULDER BEACH CHAIR (DRAPES) ×1 IMPLANT
DRSG XEROFORM 1X8 (GAUZE/BANDAGES/DRESSINGS) ×2 IMPLANT
ELECT REM PT RETURN 9FT ADLT (ELECTROSURGICAL) ×2
ELECTRODE REM PT RTRN 9FT ADLT (ELECTROSURGICAL) ×1 IMPLANT
GAUZE SPONGE 4X4 12PLY STRL (GAUZE/BANDAGES/DRESSINGS) ×2 IMPLANT
GAUZE XEROFORM 1X8 LF (GAUZE/BANDAGES/DRESSINGS) ×1 IMPLANT
GLOVE BIO SURGEON STRL SZ7 (GLOVE) ×1 IMPLANT
GLOVE BIOGEL PI IND STRL 6.5 (GLOVE) IMPLANT
GLOVE BIOGEL PI IND STRL 7.0 (GLOVE) ×2 IMPLANT
GLOVE BIOGEL PI INDICATOR 6.5 (GLOVE) ×1
GLOVE BIOGEL PI INDICATOR 7.0 (GLOVE) ×5
GLOVE ECLIPSE 6.5 STRL STRAW (GLOVE) ×2 IMPLANT
GLOVE SKINSENSE NS SZ8.0 LF (GLOVE) ×1
GLOVE SKINSENSE STRL SZ8.0 LF (GLOVE) IMPLANT
GLOVE SRG 8 PF TXTR STRL LF DI (GLOVE) ×1 IMPLANT
GLOVE SURG POLYISO LF SZ8 (GLOVE) ×5 IMPLANT
GLOVE SURG SS PI 6.5 STRL IVOR (GLOVE) ×2 IMPLANT
GLOVE SURG UNDER POLY LF SZ8 (GLOVE) ×2
GOWN STRL REUS W/ TWL XL LVL3 (GOWN DISPOSABLE) ×1 IMPLANT
GOWN STRL REUS W/TWL LRG LVL3 (GOWN DISPOSABLE) ×5 IMPLANT
GOWN STRL REUS W/TWL XL LVL3 (GOWN DISPOSABLE) ×2
INST SET MINOR BONE (KITS) ×2 IMPLANT
IV NS IRRIG 3000ML ARTHROMATIC (IV SOLUTION) ×6 IMPLANT
KIT BEACH CHAIR TRIMANO (MISCELLANEOUS) ×1 IMPLANT
KIT STABILIZATION SHOULDER (MISCELLANEOUS) ×1 IMPLANT
KIT TURNOVER KIT A (KITS) ×2 IMPLANT
MANIFOLD NEPTUNE II (INSTRUMENTS) ×2 IMPLANT
NDL SPNL 18GX3.5 QUINCKE PK (NEEDLE) IMPLANT
NEEDLE SPNL 18GX3.5 QUINCKE PK (NEEDLE) ×2 IMPLANT
NS IRRIG 1000ML POUR BTL (IV SOLUTION) ×2 IMPLANT
PACK BASIC LIMB (CUSTOM PROCEDURE TRAY) ×2 IMPLANT
PAD ABD 5X9 TENDERSORB (GAUZE/BANDAGES/DRESSINGS) ×4 IMPLANT
PAD ARMBOARD 7.5X6 YLW CONV (MISCELLANEOUS) ×2 IMPLANT
PAD CAST 4YDX4 CTTN HI CHSV (CAST SUPPLIES) ×1 IMPLANT
PADDING CAST COTTON 4X4 STRL (CAST SUPPLIES) ×2
PORT APPOLLO RF 90DEGREE MULTI (SURGICAL WAND) ×1 IMPLANT
SET BASIN LINEN APH (SET/KITS/TRAYS/PACK) ×2 IMPLANT
SLING ARM FOAM STRAP LRG (SOFTGOODS) ×1 IMPLANT
SPONGE GAUZE 4X4 12PLY (GAUZE/BANDAGES/DRESSINGS) ×1 IMPLANT
SPONGE T-LAP 18X18 ~~LOC~~+RFID (SPONGE) ×2 IMPLANT
SUT ETHILON 3 0 FSL (SUTURE) ×1 IMPLANT
SUT FIBERWIRE #2 38 T-5 BLUE (SUTURE) ×2
SUTURE FIBERWR #2 38 T-5 BLUE (SUTURE) IMPLANT
SYR BULB IRRIG 60ML STRL (SYRINGE) ×2 IMPLANT
TOWEL OR 17X26 4PK STRL BLUE (TOWEL DISPOSABLE) ×3 IMPLANT
TUBE CONNECTING 12X1/4 (SUCTIONS) ×1 IMPLANT
TUBING IN/OUT FLOW W/MAIN PUMP (TUBING) ×1 IMPLANT
YANKAUER SUCT BULB TIP 10FT TU (MISCELLANEOUS) ×1 IMPLANT
YANKAUER SUCT BULB TIP NO VENT (SUCTIONS) ×1 IMPLANT

## 2021-11-09 NOTE — Anesthesia Procedure Notes (Signed)
Anesthesia Regional Block: Interscalene brachial plexus block   Pre-Anesthetic Checklist: , timeout performed,  Correct Patient, Correct Site, Correct Laterality,  Correct Procedure, Correct Position, site marked,  Risks and benefits discussed,  Surgical consent,  Pre-op evaluation,  At surgeon's request and post-op pain management  Laterality: Upper and Right  Prep: chloraprep       Needles:  Injection technique: Single-shot  Needle Type: Echogenic Stimulator Needle     Needle Length: 8.3cm  Needle Gauge: 22   Needle insertion depth: 5 cm   Additional Needles:   Procedures:, nerve stimulator,,, ultrasound used (permanent image in chart),,     Nerve Stimulator or Paresthesia:  Response: No Twitch elicited, 0.6 mA, 0.3 ms, 5 cm  Additional Responses:   Narrative:  Start time: 11/09/2021 7:28 AM End time: 11/09/2021 7:35 AM Injection made incrementally with aspirations every 5 mL.  Performed by: Personally  Anesthesiologist: Denese Killings, MD  Additional Notes: Block assessed prior to start of surgery

## 2021-11-09 NOTE — Interval H&P Note (Signed)
History and Physical Interval Note:  11/09/2021 7:16 AM  Julia Rangel  has presented today for surgery, with the diagnosis of Right shoulder calcific tendonitis.  The various methods of treatment have been discussed with the patient and family. After consideration of risks, benefits and other options for treatment, the patient has consented to  Procedure(s) with comments: SHOULDER ARTHROSCOPY WITH DEBRIDEMENT AND BICEP TENDON REPAIR (Right) - Shoulder arthroscopy, debridement with biceps tenodesis, possible labrum repair, possible open biceps tenodesis BICEPS TENODESIS (Right) - Possible open as a surgical intervention.  The patient's history has been reviewed, patient examined, no change in status, stable for surgery.  I have reviewed the patient's chart and labs.  Questions were answered to the patient's satisfaction.    All possibilities have been discussed.  Questions answered. Ready for surgery.    Mordecai Rasmussen

## 2021-11-09 NOTE — Anesthesia Preprocedure Evaluation (Signed)
Anesthesia Evaluation  Patient identified by MRN, date of birth, ID band Patient awake    Reviewed: Allergy & Precautions, NPO status , Patient's Chart, lab work & pertinent test results  History of Anesthesia Complications (+) PONV and history of anesthetic complications  Airway Mallampati: II  TM Distance: >3 FB Neck ROM: Full    Dental  (+) Dental Advisory Given, Teeth Intact   Pulmonary neg pulmonary ROS,    Pulmonary exam normal breath sounds clear to auscultation       Cardiovascular negative cardio ROS Normal cardiovascular exam Rhythm:Regular Rate:Normal     Neuro/Psych  Headaches, PSYCHIATRIC DISORDERS Depression    GI/Hepatic negative GI ROS, Neg liver ROS,   Endo/Other  diabetes (taking meds weight loss), Type 2Hypothyroidism   Renal/GU Renal disease (stones)  negative genitourinary   Musculoskeletal Right shoulder pain   Abdominal   Peds negative pediatric ROS (+)  Hematology  (+) Blood dyscrasia, anemia ,   Anesthesia Other Findings   Reproductive/Obstetrics negative OB ROS                            Anesthesia Physical Anesthesia Plan  ASA: 2  Anesthesia Plan: General   Post-op Pain Management: Regional block* and Dilaudid IV   Induction: Intravenous  PONV Risk Score and Plan: Ondansetron, Dexamethasone, Midazolam and Scopolamine patch - Pre-op  Airway Management Planned: Oral ETT  Additional Equipment:   Intra-op Plan:   Post-operative Plan: Extubation in OR  Informed Consent: I have reviewed the patients History and Physical, chart, labs and discussed the procedure including the risks, benefits and alternatives for the proposed anesthesia with the patient or authorized representative who has indicated his/her understanding and acceptance.     Dental advisory given  Plan Discussed with: CRNA and Surgeon  Anesthesia Plan Comments:          Anesthesia Quick Evaluation

## 2021-11-09 NOTE — Anesthesia Procedure Notes (Addendum)
Procedure Name: Intubation Date/Time: 11/09/2021 7:54 AM  Performed by: Maude Leriche, CRNAPre-anesthesia Checklist: Patient identified, Emergency Drugs available, Suction available, Patient being monitored and Timeout performed Patient Re-evaluated:Patient Re-evaluated prior to induction Oxygen Delivery Method: Circle system utilized Preoxygenation: Pre-oxygenation with 100% oxygen Induction Type: IV induction Ventilation: Mask ventilation with difficulty Laryngoscope Size: Glidescope and 3 Tube type: Oral Tube size: 7.0 mm Number of attempts: 1 Airway Equipment and Method: Stylet, Video-laryngoscopy and Bite block Placement Confirmation: ETT inserted through vocal cords under direct vision, positive ETCO2, CO2 detector and breath sounds checked- equal and bilateral Secured at: 21 cm Tube secured with: Tape Dental Injury: Teeth and Oropharynx as per pre-operative assessment  Comments: Partial view of glottic opening with most anterior portion still obscured by epiglottis, tube passed easily. Intubation performed by Evern Core, SRNA

## 2021-11-09 NOTE — Op Note (Signed)
Orthopaedic Surgery Operative Note (CSN: [id number])  Julia Rangel  [birth year] Date of Surgery: 11/09/2021   Diagnoses:  Right shoulder calcific tendonitis, SLAP tear  Procedure: Arthroscopic subacromial decompression Arthroscopic biceps tenodesis Debridement of Calcific tendonitis   Operative Finding Exam under anesthesia: 110 abduction, 50 ER, 160 FF, 80 extension Articular space: No loose bodies, capsule intact, SLAP tear. Chondral surfaces:Intact, no sign of chondral degeneration on the glenoid or humeral head Biceps: Intact biceps, with mild irritation noted. Subscapularis: Intact Superior Cuff: Intact Bursal side: Mild fraying, with calcium deposition within the superficial, bursal fibers of the supraspinatus  Successful completion of the planned procedure.  Arthroscopic bicep tenodesis, anchor just above the subscapularis.  The debridement of calcium deposition within the superficial fibers of the supraspinatus.    Post-Op Diagnosis: Same Surgeons:Primary: Mordecai Rasmussen, MD Assistants: Franklyn Lor Location: [facility] OR ROOM 4 Anesthesia: General with Exparel interscalene block Antibiotics: Ancef 2 g Tourniquet time: None Estimated Blood Loss: Minimal Complications: None Specimens: None Implants: Implant Name Type Inv. Item Serial No. Manufacturer Lot No. LRB No. Used Action  ANCHOR SUT BIO SW 4.75X19.1 - [id number] Anchor ANCHOR SUT BIO SW [id number]  Rolinda Roan [id number] Right 1 Implanted    Indications for Surgery:   Julia Rangel is a 36 y.o. female with continued shoulder pain refractory to nonoperative measures for extended period of time.  She had limited range of motion.  X-ray and MRI demonstrated a calcium deposit within the footprint of the supraspinatus, which was believed to be causing some of her pain.  She also sustained a recent injury, and she had persistent pain in the anterior shoulder, with inability to lift her arm above the level of her shoulder.  The risks  and benefits were explained at length including but not limited to continued pain, biceps tenodesis failure, stiffness, need for further surgery and infection and more severe complications associated with anesthesia.  She elected to proceed.  Surgical consent was finalized.   Procedure:   Patient was correctly identified in the preoperative holding area and operative site marked.  Patient was brought to OR and positioned beachchair on an [place] table ensuring that all bony prominences were padded and the head was in an appropriate location.  Anesthesia was induced and the operative shoulder was prepped and draped in the usual sterile fashion.  Timeout was called preincision.  A standard posterior viewing portal was made after localizing the portal with a spinal needle.  An anterior accessory portal was also made.  The shoulder was evaluated, with findings noted above.  Specifically, the articular cartilage was intact, without injury.  She did have a SLAP tear.  The biceps was in the groove, with mild irritation.  We completed a very light debridement of the interval.  We then placed a Gemini cannula, deployed the wings.  Using a scorpion suture passer, we passed a high-strength suture through the tendon twice.  We then cut the tendon at the insertion of the superior labrum.  At this point, with full control of the labrum.  We excellent visualization of the subscapularis.  We then positioned the arm, in order to have clear visibility of the groove, as well as the superior aspect of the subscapularis.  A punch was then introduced.  Sutures from the biceps tendon were then passed through a swivel lock anchor, and the anchor was malleted into place.  There was excellent bite on the anchor.  Positioning of the biceps was high within the groove.  We  then lightly debrided the stump at the superior aspect of the labrum.  At this point, there is nothing further that needed to be addressed within the glenohumeral  joint.  We then placed the camera within the subacromial space.  A lateral portal was created, and a shaver was introduced to debride the subacromial bursitis.  We then introduced electrocautery, and debrided the undersurface of the acromion.  At this point, we had excellent visibility within the subacromial space.  The cuff was evaluated and deemed to be intact.  In the anterior and lateral aspect of the shoulder, there was some significant hyperemia, and clear irritation of the rotator cuff.  This area was evaluated very closely.  Using a probe, the soft tissue of the rotator cuff, at the footprint was gently probed.  We are able to identify an area which was consistent with the preoperative imaging.  We lightly debrided the superficial's fibers of the supraspinatus in this area, and identified a large calcium deposit.  We then proceeded to debride this area further.  The calcium was debrided in piecemeal fashion.  We used a probe and switching stick to gently debride the area, to free up additional calcium.  Once we felt as though we had removed as much calcium as possible, we introduced the shaver, and gently debrided some of the fibers.  We then used the probe, and no additional calcium was identified.  The shaver was then introduced again, and residual small portions of calcium were debrided.  The rotator cuff was evaluated once again, and there were no further injuries.  No additional pathology was identified.  The camera and instruments were removed from the subacromial space.  The incisions were closed with 3-0 nylon.  A sterile dressing was placed along with a sling and an ice machine. The patient was awoken from general anesthesia and taken to the PACU in stable condition without complication.    Post-operative plan:  The patient will be non-weightbearing in a sling. The patient will be discharged home.   DVT prophylaxis with aspirin twice daily until she is fully mobile. Pain control with PRN  pain medication preferring oral medicines.   Follow up plan will be scheduled in approximately 7-10 days for incision check and XR.

## 2021-11-09 NOTE — Transfer of Care (Addendum)
Immediate Anesthesia Transfer of Care Note  Patient: Amere Iott  Procedure(s) Performed: SHOULDER ARTHROSCOPY WITH DEBRIDEMENT OF CALCIFIC TENDONITIS AND BICEP TENDON REPAIR (Right: Shoulder) BICEPS TENODESIS (Right)  Patient Location: PACU  Anesthesia Type:GA combined with regional for post-op pain  Level of Consciousness: awake, alert  and oriented  Airway & Oxygen Therapy: Patient Spontanous Breathing and Patient connected to nasal cannula oxygen  Post-op Assessment: Report given to RN, Post -op Vital signs reviewed and stable and Patient able to stick tongue midline  Post vital signs: Reviewed  Last Vitals:  Vitals Value Taken Time  BP 123/78 11/09/21 1015  Temp 98.2   Pulse 99 11/09/21 1016  Resp 16 11/09/21 1016  SpO2 98 % 11/09/21 1016  Vitals shown include unvalidated device data.  Last Pain:  Vitals:   11/09/21 0623  TempSrc: Oral  PainSc: 0-No pain      Patients Stated Pain Goal: 7 (50/35/46 5681)  Complications: No notable events documented.

## 2021-11-09 NOTE — Discharge Instructions (Signed)
Julia Rangel A. Julia Kinsman, MD Julia Rangel [address] [place],  [place]  [address] Phone: [phone] Fax: [phone]    POST-OPERATIVE INSTRUCTIONS - SHOULDER ARTHROSCOPY  WOUND CARE You may remove the Operative Dressing on Post-Op Day #3 (72hrs after surgery).   Alternatively if you would like you can leave dressing on until follow-up if within 7-8 days but keep it dry. Leave steri-strips in place until they fall off on their own, usually 2 weeks postop. There may be a small amount of fluid/bleeding leaking at the surgical site. This is normal; the shoulder is filled with fluid during the procedure and can leak for 24-48hrs after surgery. ou may change/reinforce the bandage as needed.  Use the Cryocuff or Ice as often as possible for the first 7 days, then as needed for pain relief. Always keep a towel, ACE wrap or other barrier between the cooling unit and your skin.  You may shower on Post-Op Day #3. Gently pat the area dry. Do not soak the shoulder in water or submerge it. Keep dry incisions as dry as possible. Do not go swimming in the pool or ocean until 4 weeks after surgery or when otherwise instructed.    EXERCISES/BRACING Sling should be used at all times until follow-up.  You can remove sling for hygiene.    Please continue to ambulate and do not stay sitting or lying for too long. Perform foot and wrist pumps to assist in circulation.  POST-OP MEDICATIONS- Multimodal approach to pain control In general your pain will be controlled with a combination of substances.  Prescriptions unless otherwise discussed are electronically sent to your pharmacy.  This is a carefully made plan we use to minimize narcotic use.    Celebrex - Anti-inflammatory medication taken on a scheduled basis Acetaminophen - Non-narcotic pain medicine taken on a scheduled basis  Oxycodone - This is a strong narcotic, to be used only on an "as needed" basis for pain. Aspirin '81mg'$   - This medicine is used to minimize the risk of blood clots after surgery. Zofran - take as needed for nausea  FOLLOW-UP If you develop a Fever (?101.5), Redness or Drainage from the surgical incision site, please call our office to arrange for an evaluation. Please call the office to schedule a follow-up appointment for your suture removal, 10-14 days post-operatively.    HELPFUL INFORMATION  If you had a block, it will wear off between 8-24 hrs postop typically.  This is period when your pain may go from nearly zero to the pain you would have had postop without the block.  This is an abrupt transition but nothing dangerous is happening.  You may take an extra dose of narcotic when this happens.  You may be more comfortable sleeping in a semi-seated position the first few nights following surgery.  Keep a pillow propped under the elbow and forearm for comfort.  If you have a recliner type of chair it might be beneficial.  If not that is fine too, but it would be helpful to sleep propped up with pillows behind your operated shoulder as well under your elbow and forearm.  This will reduce pulling on the suture lines.  When dressing, put your operative arm in the sleeve first.  When getting undressed, take your operative arm out last.  Loose fitting, button-down shirts are recommended.  Often in the first days after surgery you may be more comfortable keeping your operative arm under your shirt and  not through the sleeve.  You may return to work/school in the next couple of days when you feel up to it.  Desk work and typing in the sling is     fine.  We suggest you use the pain medication the first night prior to going to bed, in order to ease any pain when the anesthesia wears off. You should avoid taking pain medications on an empty stomach as it will make you nauseous.  You should wean off your narcotic medicines as soon as you are able.  Most patients will be off or using minimal narcotics  before their first postop appointment.   Do not drink alcoholic beverages or take illicit drugs when taking pain medications.  It is against the law to drive while taking narcotics.  In some states it is against the law to drive while your arm is in a sling.   Pain medication may make you constipated.  Below are a few solutions to try in this order: Decrease the amount of pain medication if you aren't having pain. Drink lots of decaffeinated fluids. Drink prune juice and/or eat dried prunes  If the first 3 don't work start with additional solutions Take Colace - an over-the-counter stool softener Take Senokot - an over-the-counter laxative Take Miralax - a stronger over-the-counter laxative

## 2021-11-09 NOTE — H&P (Signed)
Below is the most recent clinic note for Julia Rangel; any pertinent information regarding their recent medical history will be updated on the day of surgery.    New Patient Visit   Assessment: Julia Rangel is a 36 y.o. female with the following: 1. Calcific bursitis of shoulder 2. Right anterior shoulder pain   Plan: Tilley Paletta has chronic pain in her right shoulder.  This has been ongoing for several years.  She describes a recent fall, and since then, she has significant pain in the anterior shoulder.  She has difficulty with forward flexion beyond 90 degrees.  Provocative testing for the labrum and long head of the biceps tendon elicits significant pain.  We reviewed the MRI in clinic today which demonstrates a large calcium deposits overlying the footprint of the supraspinatus, with some underlying fraying of the tendon.  In addition, the MRI is not an arthrogram, but there appears to be a SLAP tear.  As a result of the chronicity of her symptoms, and the current overall dysfunction of her right shoulder, she is interested in pursuing surgery.  We discussed potential treatment options which would include arthroscopic surgery, debridement, evaluation of the rotator cuff tendons with removal of the calcium deposit, and likely biceps tenodesis.  There is a small chance that she may have to have an open biceps tenodesis.   Risks and benefits of the surgery, including, but not limited to infection, bleeding, persistent pain, need for further surgery and more severe complications associated with anesthesia were discussed with the patient.  The patient has elected to proceed.   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  Case will be posted for November 09, 2021.     Follow-up: Return for After surgery; DOS 11/09/2021.   Subjective:       Chief Complaint  Patient presents with   New Patient (Initial Visit)   Shoulder Pain      Rt shoulder/ referred by  Dr. Keeling "Frayed rotator cuff and calcium deposits" Discuss surgery      History of Present Illness: Julia Rangel is a 36 y.o. female who presents for evaluation of right shoulder pain.  She has previously been evaluated by Dr. Keeling.  She has had pain in the right shoulder for several years.  More recently, she describes a fall.  Since then, she has had significant pain in the anterior aspect of her right shoulder.  She has difficulty with overhead motion.  She has obtained an MRI, which demonstrates a calcium deposits within the subacromial space, with associated fraying of the bursal side of the rotator cuff tendons.  She has tried medications as needed.  She is previously done physical therapy.  She has had injections in her right shoulder.  None of these have provided her with any sustained relief.  She is interested in surgery.     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   Dysplasia of cervix     Family history of BRCA1 gene positive     Family history of breast cancer     Gestational diabetes     History of cesarean delivery 10/09/2016      X2 plans rpeat   Kidney stone 2011   Menorrhagia     Migraines      since age 18 per patient   Patient desires pregnancy 06/01/2013   PMDD (premenstrual dysphoric disorder)      PROZAC 20 MG BUT NONE WITH PREGNANCY   PONV (postoperative nausea and vomiting)     Vaginal Pap smear, abnormal             Past Surgical History:  Procedure Laterality Date   BIOPSY N/A 05/04/2021    Procedure: BIOPSY;  Surgeon: Rehman, Najeeb U, MD;  Location: [facility];  Service: Endoscopy;  Laterality: N/A;   CESAREAN SECTION   2010   CESAREAN SECTION N/A 03/29/2014    Procedure: REPEAT CESAREAN SECTION;   Surgeon: Luther H Eure, MD;  Location: [facility];  Service: Obstetrics;  Laterality: N/A;   CESAREAN SECTION WITH BILATERAL TUBAL LIGATION Bilateral 05/08/2017    Procedure: CESAREAN SECTION WITH BILATERAL TUBAL LIGATION;  Surgeon: Ferguson, John V, MD;  Location: [facility];  Service: Obstetrics;  Laterality: Bilateral;   COLONOSCOPY N/A 05/04/2021    Procedure: COLONOSCOPY;  Surgeon: Rehman, Najeeb U, MD;  Location: [facility];  Service: Endoscopy;  Laterality: N/A;  1245   HYSTERECTOMY ABDOMINAL WITH SALPINGECTOMY N/A 09/15/2019    Procedure: HYSTERECTOMY ABDOMINAL and OPEN BILATERAL SALPINGECTOMY;  Surgeon: Ferguson, John V, MD;  Location: [facility];  Service: Gynecology;  Laterality: N/A;   KIDNEY STONE SURGERY   2010    stent   LEEP   2008   phylloid removed   2009    left breast   TONSILLECTOMY       WISDOM TOOTH EXTRACTION               Family History  Problem Relation Age of Onset   Diabetes Maternal Grandmother     Breast cancer Maternal Grandmother          dx 40s-50   Diabetes Paternal Grandmother     Breast cancer Paternal Grandmother 69   COPD Paternal Grandfather     Heart disease Maternal Grandfather     Breast cancer Mother 58   BRCA 1/2 Mother          BRCA1 pos   Ovarian cancer Mother     Other Mother          peritoneal cancer   Colon polyps Maternal Uncle     SIDS Maternal Aunt          2 babies died of SIDS at 4 months    Social History         Tobacco Use   Smoking status: Never   Smokeless tobacco: Never  Vaping Use   Vaping Use: Never used  Substance Use Topics   Alcohol use: Yes      Alcohol/week: 7.0 standard drinks      Types: 7 Glasses of wine per week   Drug use: No      No Known Allergies   Active Medications      Current Meds  Medication Sig   acetaminophen-codeine (TYLENOL #3) 300-30 MG tablet Take 1-2 tablets by mouth every 4 (four) hours as needed for moderate pain.   COLLAGEN PO Take 2 each by mouth daily. Gummies    EMGALITY 120 MG/ML SOSY Inject 120 mg into the skin every 30 (thirty) days.   FLUoxetine (PROZAC) 20 MG tablet Take 1 tablet by mouth once daily   FLUoxetine (PROZAC) 40 MG   capsule Take 1 capsule by mouth once daily   Semaglutide-Weight Management 0.5 MG/0.5ML SOAJ Inject 0.5 mg into the skin once a week for 28 days.   [START ON 10/11/2021] Semaglutide-Weight Management 1 MG/0.5ML SOAJ Inject 1 mg into the skin once a week for 28 days.   [START ON 11/09/2021] Semaglutide-Weight Management 1.7 MG/0.75ML SOAJ Inject 1.7 mg into the skin once a week for 28 days.   [START ON 12/08/2021] Semaglutide-Weight Management 2.4 MG/0.75ML SOAJ Inject 2.4 mg into the skin once a week for 28 days.   sulfamethoxazole-trimethoprim (BACTRIM DS) 800-160 MG tablet Take 1 tablet by mouth 2 (two) times daily.   SUMAtriptan (IMITREX) 100 MG tablet Take 100 mg by mouth every 2 (two) hours as needed for migraine. May repeat in 2 hours if headache persists or recurs.   trimethoprim (TRIMPEX) 100 MG tablet Take 1 tablet (100 mg total) by mouth at bedtime.        Objective: LMP 07/14/2019 (Approximate)    Physical Exam:   General: Alert and oriented. and No acute distress. Gait: Normal gait.   Right shoulder without deformity.  Active forward flexion to 90 degrees, with obvious discomfort.  She has tenderness to palpation over the anterior shoulder.  Mild discomfort in the empty can testing position.  Positive O'Brien's testing.  Exquisite pain in the speeds testing position.  Passively I can range her to 120 degrees of forward flexion, but this is very uncomfortable for her.  Fingers are warm and well-perfused.  IMAGING:     Right shoulder MRI.    IMPRESSION: Calcific tendinosis of the supraspinatus tendon with adjacent bursal sided fraying at the footprint and mild subacromial-subdeltoid bursitis. No high-grade or retracted cuff tear. No significant muscle atrophy.   New Medications:  No orders of the defined  types were placed in this encounter.     

## 2021-11-09 NOTE — Anesthesia Postprocedure Evaluation (Signed)
Anesthesia Post Note  Patient: Julia Rangel  Procedure(s) Performed: SHOULDER ARTHROSCOPY WITH DEBRIDEMENT OF CALCIFIC TENDONITIS AND BICEP TENDON REPAIR (Right: Shoulder) BICEPS TENODESIS (Right)  Patient location during evaluation: PACU Anesthesia Type: General Level of consciousness: awake and alert and oriented Pain management: pain level controlled Vital Signs Assessment: post-procedure vital signs reviewed and stable Respiratory status: spontaneous breathing, nonlabored ventilation and respiratory function stable Cardiovascular status: blood pressure returned to baseline and stable Postop Assessment: no apparent nausea or vomiting Anesthetic complications: no   No notable events documented.   Last Vitals:  Vitals:   11/09/21 1030 11/09/21 1045  BP: 123/65 117/68  Pulse: 92 89  Resp: 13 19  Temp:    SpO2: 98% 97%    Last Pain:  Vitals:   11/09/21 1015  TempSrc:   PainSc: 0-No pain                 Jolee Critcher C Tanishi Nault

## 2021-11-14 ENCOUNTER — Encounter (HOSPITAL_COMMUNITY): Payer: Self-pay | Admitting: Orthopedic Surgery

## 2021-11-15 ENCOUNTER — Encounter: Payer: Self-pay | Admitting: Orthopedic Surgery

## 2021-11-16 NOTE — Telephone Encounter (Signed)
Called pt and went over information from provider. No further questions or concerns at this time. Pt will call back if she feels he appointment needs to be moved up any.

## 2021-11-21 ENCOUNTER — Encounter: Payer: BC Managed Care – PPO | Admitting: Orthopaedic Surgery

## 2021-11-23 ENCOUNTER — Ambulatory Visit: Payer: BC Managed Care – PPO | Admitting: Urology

## 2021-11-24 ENCOUNTER — Ambulatory Visit (INDEPENDENT_AMBULATORY_CARE_PROVIDER_SITE_OTHER): Payer: BC Managed Care – PPO

## 2021-11-24 ENCOUNTER — Encounter: Payer: Self-pay | Admitting: Orthopedic Surgery

## 2021-11-24 ENCOUNTER — Ambulatory Visit (INDEPENDENT_AMBULATORY_CARE_PROVIDER_SITE_OTHER): Payer: BC Managed Care – PPO | Admitting: Orthopedic Surgery

## 2021-11-24 DIAGNOSIS — M7530 Calcific tendinitis of unspecified shoulder: Secondary | ICD-10-CM

## 2021-11-24 DIAGNOSIS — M7531 Calcific tendinitis of right shoulder: Secondary | ICD-10-CM | POA: Diagnosis not present

## 2021-11-24 NOTE — Patient Instructions (Addendum)
Referral for PT  Medications as needed  Continue to use sling as needed for the next 2 weeks

## 2021-11-24 NOTE — Progress Notes (Signed)
Orthopaedic Postop Note  Assessment: Julia Rangel is a 36 y.o. female s/p open biceps tenodesis, and debridement of calcific tendinitis  DOS: 11/09/21  Plan: Sutures were removed, Steri-Strips placed. Reviewed the procedure, and anticipated recovery Placed referral for physical therapy Sling as needed Medications as needed Call with issues Follow-up 4 weeks   Follow-up: Return in about 4 weeks (around 12/22/2021). XR at next visit:  None   Subjective:  Chief Complaint  Patient presents with   Post-op Follow-up    Right Shoulder DOS 11/09/21 -states she's doing good    History of Present Illness: Julia Rangel is a 36 y.o. female who presents following the above stated procedure.  She is doing well now.  Last week, she states that she stumbled going up the stairs, tried to brace herself with her right arm.  She felt a tearing sensation in the right arm.  This was followed by some pain.  Since then, her pain has resolved.  She has returned to work.  She is no longer taking Tylenol or ibuprofen.  She did require some pain medication following surgery.  She has been using a sling most of the time  Review of Systems: No fevers or chills No numbness or tingling No Chest Pain No shortness of breath   Objective: LMP 07/14/2019 (Approximate)   Physical Exam:  The small amount of bruising.  Some swelling.  Incisions are healing well.  No surrounding erythema or drainage.  She tolerates passive forward flexion to 100 degrees.  Abduction to 90 degrees.  Fingers are warm and well-perfused.  Sensation is intact throughout the right upper extremity.  IMAGING: I personally ordered and reviewed the following images:  X-rays of the right shoulder were obtained in clinic.  No acute injuries are noted.  Almost complete removal of the calcium deposit within the subacromial space.  Small amount of residual calcium deposition.  No proximal humeral migration.  No fractures are  appreciated.  Impression: Right shoulder x-ray with sequelae of shoulder arthroscopy   Mordecai Rasmussen, MD 11/24/2021 9:09 AM

## 2021-11-28 ENCOUNTER — Ambulatory Visit (HOSPITAL_COMMUNITY): Payer: BC Managed Care – PPO | Attending: Orthopedic Surgery

## 2021-11-28 DIAGNOSIS — M25611 Stiffness of right shoulder, not elsewhere classified: Secondary | ICD-10-CM | POA: Insufficient documentation

## 2021-11-28 DIAGNOSIS — M25511 Pain in right shoulder: Secondary | ICD-10-CM | POA: Diagnosis present

## 2021-11-28 DIAGNOSIS — R29898 Other symptoms and signs involving the musculoskeletal system: Secondary | ICD-10-CM | POA: Insufficient documentation

## 2021-11-28 NOTE — Therapy (Signed)
OUTPATIENT OCCUPATIONAL THERAPY ORTHO EVALUATION  Patient Name: Julia Rangel MRN: [id number] DOB:[birth year], 36 y.o., female Today's Date: 11/29/2021  PCP: Ihor Dow, MD  REFERRING PROVIDER: Larena Glassman, MD   OT End of Session - 11/28/21 1717     Visit Number 1    Number of Visits 16    Date for OT Re-Evaluation 01/26/22   Mini reassessment 8/25   Authorization Type [place] Time Period no auth    OT Start Time 6004    OT Stop Time 1550    OT Time Calculation (min) 35 min    Activity Tolerance Patient tolerated treatment well    Behavior During Therapy [facility] for tasks assessed/performed             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   Dysplasia of cervix    Family history of BRCA1 gene positive    Family history of breast cancer    Gestational diabetes    History of cesarean delivery 10/09/2016   X2 plans rpeat   Kidney stone 2011   Menorrhagia    Migraines    since age 70 per patient   Patient desires pregnancy 06/01/2013   PMDD (premenstrual dysphoric disorder)    PROZAC 20 MG BUT NONE WITH PREGNANCY   PONV (postoperative nausea and vomiting)    Vaginal Pap smear, abnormal    Past Surgical History:  Procedure Laterality Date   BICEPT TENODESIS Right 11/09/2021   Procedure: BICEPS TENODESIS;  Surgeon: Mordecai Rasmussen, MD;  Location: [facility];  Service: Orthopedics;  Laterality: Right;   BIOPSY N/A 05/04/2021   Procedure: BIOPSY;  Surgeon: Rogene Houston, MD;  [facility];  Service: Endoscopy;  Laterality: N/A;   CESAREAN SECTION  2010   CESAREAN SECTION N/A 03/29/2014   Procedure: REPEAT CESAREAN SECTION;  Surgeon: Florian Buff, MD;  Location: [facility];  Service: Obstetrics;  Laterality: N/A;   CESAREAN SECTION WITH BILATERAL TUBAL LIGATION Bilateral 05/08/2017   Procedure: CESAREAN SECTION WITH BILATERAL TUBAL LIGATION;  Surgeon: Jonnie Kind, MD;  Location: [place];  Service: Obstetrics;  Laterality: Bilateral;   COLONOSCOPY N/A 05/04/2021   Procedure: COLONOSCOPY;  Surgeon: Rogene Houston, MD;  [facility];  Service: Endoscopy;  Laterality: N/A;  1245   HYSTERECTOMY ABDOMINAL WITH SALPINGECTOMY N/A 09/15/2019   Procedure: HYSTERECTOMY ABDOMINAL and OPEN BILATERAL SALPINGECTOMY;  Surgeon: Jonnie Kind, MD;  Location: [facility];  Service: Gynecology;  Laterality: N/A;   KIDNEY STONE SURGERY  2010   stent   LEEP  2008   phylloid removed  2009   left breast   SHOULDER ARTHROSCOPY WITH DEBRIDEMENT AND BICEP TENDON REPAIR Right 11/09/2021   Procedure: SHOULDER ARTHROSCOPY WITH DEBRIDEMENT OF CALCIFIC TENDONITIS AND BICEP TENDON REPAIR;  Surgeon: Mordecai Rasmussen, MD;  Location: [facility];  Service: Orthopedics;  Laterality: Right;   TONSILLECTOMY     WISDOM TOOTH EXTRACTION     Patient Active Problem List   Diagnosis Date Noted   Acute cystitis without hematuria 08/14/2021   History of nephrolithiasis 08/14/2021   Chronic right shoulder pain 08/14/2021   Rectal bleeding 03/17/2021   Hypothyroidism 03/17/2021   Mixed hyperlipidemia 03/17/2021   Menorrhagia with regular cycle 09/15/2019   S/P abdominal hysterectomy 09/15/2019   Family history of ovarian cancer 05/20/2019   Family history of breast cancer  Family history of BRCA1 gene positive    Ovarian cyst 11/08/2016   Migraine 02/02/2015   Obesity (BMI 30.0-34.9) 02/02/2015   Personal history of malignant phylloides tumor of breast 09/19/2012   Anemia 09/19/2012   Menorrhagia 09/19/2012   PMDD (premenstrual dysphoric disorder) 09/19/2012   Severe dysplasia of cervix (CIN III) 09/19/2012    ONSET DATE: 11/09/21  REFERRING DIAG: M75.30 (ICD-10-CM) - Calcific bursitis of shoulder  THERAPY DIAG:  Acute pain of right shoulder  Stiffness of right shoulder, not elsewhere classified  Other symptoms and signs involving the musculoskeletal system  Rationale for  Evaluation and Treatment Rehabilitation   SUBJECTIVE:   SUBJECTIVE STATEMENT: S: "It feels good. I am happy with how it is healing."  Pt presents today without her sling and reports that she has not been wearing in for several days.   Pt accompanied by: self  PERTINENT HISTORY: Pt presents s/p open biceps tenodesis and debridement of calcific tendinitis 11/09/21 following a fall. She was instructed to wear a sling for 3-4 weeks. Pt reports falling several days after surgery with pain for a day or two that has since gotten better. She reports that she heard a tear and a pop. She was seen by Dr. [name] on 7/21 and has another follow-up scheduled on 8/18. Pt was referred to OT for evaluation and treatment.     PRECAUTIONS: None  WEIGHT BEARING RESTRICTIONS  5-10 lb lifting limit; No resisted biceps   PAIN:  Are you having pain? Yes: NPRS scale: 1/10 Pain location: proximal bicep Pain description: shooting pain that dissipates quickly Aggravating factors: gets worse at night Relieving factors: Ice, tylenol  5/10 pain at night   FALLS: Has patient fallen in last 6 months? Yes. Number of falls 1 resulting in current injury  LIVING ENVIRONMENT: Lives with: lives with their spouse, lives with their son, and lives with their daughter Lives in: House/apartment   PLOF: Independent  PATIENT GOALS "I just want to feel normal again, do the same movements"  OBJECTIVE:   HAND DOMINANCE: Right  ADLs: Overall ADLs: Pt reports being independent with BADLs. Sleep and leisure continue to be impacted.  Pt is a high Education officer, museum.  Hobbies include working out, swimming, and playing with her children.   FUNCTIONAL OUTCOME MEASURES: FOTO: 62.05  UPPER EXTREMITY ROM     Pt seated Active ROM Right eval  Shoulder flexion 90  Shoulder abduction 90  Shoulder internal rotation 90  Shoulder external rotation 60  Elbow Flexion  WNL  Elbow Extension WNL  (Blank rows = not tested)  Pt  seated Passive  ROM Right eval  Shoulder flexion 160  Shoulder abduction 160  Shoulder internal rotation 90  Shoulder external rotation 65  Elbow flexion WNL  Elbow extension WNL  (Blank rows = not tested)   UPPER EXTREMITY MMT:     MMT Right eval  Shoulder flexion 3/5  Shoulder abduction 3/5  Shoulder internal rotation 4+/5  Shoulder external rotation 4+/5  Elbow flexion 3/5  Elbow extension 3/5  Wrist flexion 5/5  Wrist extension 5/5  Wrist ulnar deviation 5/5  Wrist radial deviation 5/5  Wrist pronation 3+/5  Wrist supination 3+/5  (Blank rows = not tested)  HAND FUNCTION: Grip strength: Right: 66 lbs; Left: 65 lbs, Lateral pinch: Right: 18 lbs, Left: 19 lbs, and 3 point pinch: Right: 19 lbs, Left: 19 lbs    COGNITION: Overall cognitive status: Within functional limits for tasks assessed   TODAY'S TREATMENT:  Evaluation only   PATIENT EDUCATION: Education details: Healing timeline, role of occupational therapy, plan of care Person educated: Patient Education method: Explanation Education comprehension: verbalized understanding   HOME EXERCISE PROGRAM: Will establish with initial treatment session   GOALS: Goals reviewed with patient? Yes  SHORT TERM GOALS: Target date: 12/29/21   Pt will be provided with and educated on HEP to improve mobility in RUE required for ADL completion.   Goal status: INITIAL  2.  Pt will decrease pain in RUE to 3/10 or less in order to sleep for 4+ consecutive hours without waking due to pain.  Baseline:  Goal status: INITIAL  3.  Pt will decrease RUE fascial restrictions to moderate amounts or less to improve mobility required for overhead reaching tasks.   Goal status: INITIAL  4.  Pt will increase P/ROM in RUE to WNL to improve ability to perform dressing tasks with minimal compensatory techniques.   Goal status: INITIAL  5.  Pt will increase strength in RUE to 4/5 to improve ability to perform meal preparation  tasks such as lifting pots and pans.   Goal status: INITIAL    LONG TERM GOALS: Target date: 01/26/22   Pt will decrease pain in RUE to 1/10 or less in order to sleep throughout the night without waking due to pain.   Goal status: INITIAL  2.  Pt will decrease LUE fascial restrictions to trace amounts or less to improve mobility required for reaching into cabinets to retreive food items from the top shelf.   Goal status: INITIAL  3.  Pt will increase A/ROM of LUE to WNL to improve ability to reach overhead and behind back during dressing and bathing tasks.   Goal status: INITIAL  4.  Pt will increase strength in LUE to 5/5 to improve ability to perform lifting tasks required child care.   Goal status: INITIAL   ASSESSMENT:  CLINICAL IMPRESSION: Patient is a 36 y.o. female who was seen today for occupational therapy evaluation s/p biceps tenodesis. She presents with decreased ROM and strength, as well as increased pain and fascial restrictions. Pt will benefit from skilled OT services to address the above stated deficits.   PERFORMANCE DEFICITS in functional skills including ADLs, IADLs, ROM, strength, pain, fascial restrictions, muscle spasms, body mechanics, and UE functional use.  IMPAIRMENTS are limiting patient from ADLs, IADLs, rest and sleep, work, and leisure.   COMORBIDITIES has no other co-morbidities that affects occupational performance. Patient will benefit from skilled OT to address above impairments and improve overall function.  MODIFICATION OR ASSISTANCE TO COMPLETE EVALUATION: No modification of tasks or assist necessary to complete an evaluation.  OT OCCUPATIONAL PROFILE AND HISTORY: Problem focused assessment: Including review of records relating to presenting problem.  CLINICAL DECISION MAKING: LOW - limited treatment options, no task modification necessary  REHAB POTENTIAL: Excellent  EVALUATION COMPLEXITY: Low      PLAN: OT FREQUENCY:  2x/week  OT DURATION: 8 weeks  PLANNED INTERVENTIONS: self care/ADL training, therapeutic exercise, therapeutic activity, manual therapy, scar mobilization, passive range of motion, electrical stimulation, ultrasound, moist heat, cryotherapy, patient/family education, energy conservation, coping strategies training, and DME and/or AE instructions   CONSULTED AND AGREED WITH PLAN OF CARE: Patient  PLAN FOR NEXT SESSION: PROM, A/ROM, scapular ROM, grip strengthening, light rotator cuff strengthening   Flonnie Hailstone, OT 11/29/2021, 10:11 AM

## 2021-11-29 ENCOUNTER — Encounter (HOSPITAL_COMMUNITY): Payer: Self-pay

## 2021-12-06 ENCOUNTER — Encounter (HOSPITAL_COMMUNITY): Payer: BC Managed Care – PPO

## 2021-12-07 ENCOUNTER — Encounter (HOSPITAL_COMMUNITY): Payer: BC Managed Care – PPO | Admitting: Physical Therapy

## 2021-12-08 ENCOUNTER — Encounter (HOSPITAL_COMMUNITY): Payer: Self-pay | Admitting: Occupational Therapy

## 2021-12-08 ENCOUNTER — Ambulatory Visit (HOSPITAL_COMMUNITY): Payer: BC Managed Care – PPO | Attending: Orthopedic Surgery | Admitting: Occupational Therapy

## 2021-12-08 ENCOUNTER — Encounter: Payer: Self-pay | Admitting: Orthopedic Surgery

## 2021-12-08 DIAGNOSIS — R29898 Other symptoms and signs involving the musculoskeletal system: Secondary | ICD-10-CM | POA: Insufficient documentation

## 2021-12-08 DIAGNOSIS — M25511 Pain in right shoulder: Secondary | ICD-10-CM | POA: Diagnosis present

## 2021-12-08 DIAGNOSIS — M25611 Stiffness of right shoulder, not elsewhere classified: Secondary | ICD-10-CM | POA: Diagnosis present

## 2021-12-08 NOTE — Therapy (Signed)
OUTPATIENT OCCUPATIONAL THERAPY ORTHO TREATMENT  Patient Name: Julia Rangel MRN: [id number] DOB:1985/10/05, 36 y.o., female Today's Date: 12/08/2021  PCP: Ihor Dow, MD  REFERRING PROVIDER: Larena Glassman, MD   OT End of Session - 12/08/21 0941     Visit Number 2    Number of Visits 16    Date for OT Re-Evaluation 01/26/22   Mini reassessment 8/25   Authorization Type [place] Time Period no auth    OT Start Time 0900    OT Stop Time 0940    OT Time Calculation (min) 40 min    Activity Tolerance Patient tolerated treatment well    Behavior During Therapy [facility] for tasks assessed/performed              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   Dysplasia of cervix    Family history of BRCA1 gene positive    Family history of breast cancer    Gestational diabetes    History of cesarean delivery 10/09/2016   X2 plans rpeat   Kidney stone 2011   Menorrhagia    Migraines    since age 65 per patient   Patient desires pregnancy 06/01/2013   PMDD (premenstrual dysphoric disorder)    PROZAC 20 MG BUT NONE WITH PREGNANCY   PONV (postoperative nausea and vomiting)    Vaginal Pap smear, abnormal    Past Surgical History:  Procedure Laterality Date   BICEPT TENODESIS Right 11/09/2021   Procedure: BICEPS TENODESIS;  Surgeon: Mordecai Rasmussen, MD;  Location: [facility];  Service: Orthopedics;  Laterality: Right;   BIOPSY N/A 05/04/2021   Procedure: BIOPSY;  Surgeon: Rogene Houston, MD;  [facility];  Service: Endoscopy;  Laterality: N/A;   CESAREAN SECTION  2010   CESAREAN SECTION N/A 03/29/2014   Procedure: REPEAT CESAREAN SECTION;  Surgeon: Florian Buff, MD;  Location: [facility];  Service: Obstetrics;  Laterality: N/A;   CESAREAN SECTION WITH BILATERAL TUBAL LIGATION Bilateral 05/08/2017   Procedure: CESAREAN SECTION WITH BILATERAL TUBAL LIGATION;  Surgeon: Jonnie Kind, MD;  Location: [place];  Service: Obstetrics;  Laterality: Bilateral;   COLONOSCOPY N/A 05/04/2021   Procedure: COLONOSCOPY;  Surgeon: Rogene Houston, MD;  [facility];  Service: Endoscopy;  Laterality: N/A;  1245   HYSTERECTOMY ABDOMINAL WITH SALPINGECTOMY N/A 09/15/2019   Procedure: HYSTERECTOMY ABDOMINAL and OPEN BILATERAL SALPINGECTOMY;  Surgeon: Jonnie Kind, MD;  Location: [facility];  Service: Gynecology;  Laterality: N/A;   KIDNEY STONE SURGERY  2010   stent   LEEP  2008   phylloid removed  2009   left breast   SHOULDER ARTHROSCOPY WITH DEBRIDEMENT AND BICEP TENDON REPAIR Right 11/09/2021   Procedure: SHOULDER ARTHROSCOPY WITH DEBRIDEMENT OF CALCIFIC TENDONITIS AND BICEP TENDON REPAIR;  Surgeon: Mordecai Rasmussen, MD;  Location: [facility];  Service: Orthopedics;  Laterality: Right;   TONSILLECTOMY     WISDOM TOOTH EXTRACTION     Patient Active Problem List   Diagnosis Date Noted   Acute cystitis without hematuria 08/14/2021   History of nephrolithiasis 08/14/2021   Chronic right shoulder pain 08/14/2021   Rectal bleeding 03/17/2021   Hypothyroidism 03/17/2021   Mixed hyperlipidemia 03/17/2021   Menorrhagia with regular cycle 09/15/2019   S/P abdominal hysterectomy 09/15/2019   Family history of ovarian cancer 05/20/2019   Family history of breast cancer  Family history of BRCA1 gene positive    Ovarian cyst 11/08/2016   Migraine 02/02/2015   Obesity (BMI 30.0-34.9) 02/02/2015   Personal history of malignant phylloides tumor of breast 09/19/2012   Anemia 09/19/2012   Menorrhagia 09/19/2012   PMDD (premenstrual dysphoric disorder) 09/19/2012   Severe dysplasia of cervix (CIN III) 09/19/2012    ONSET DATE: 11/09/21  REFERRING DIAG: M75.30 (ICD-10-CM) - Calcific bursitis of shoulder  THERAPY DIAG:  Acute pain of right shoulder  Stiffness of right shoulder, not elsewhere classified  Other symptoms and signs involving the musculoskeletal system  Rationale for  Evaluation and Treatment Rehabilitation   SUBJECTIVE:   SUBJECTIVE STATEMENT: S: It's doing really good actually.   PERTINENT HISTORY: Pt presents s/p open biceps tenodesis and debridement of calcific tendinitis 11/09/21 following a fall. She was instructed to wear a sling for 3-4 weeks. Pt reports falling several days after surgery with pain for a day or two that has since gotten better. She reports that she heard a tear and a pop. She was seen by Dr. [name] on 7/21 and has another follow-up scheduled on 8/18. Pt was referred to OT for evaluation and treatment.     PRECAUTIONS: Shoulder and Other: Progress as tolerated, 5-10# weight limit, no resisted bicep work  PAIN:  Are you having pain? Yes: NPRS scale: 1/10 Pain location: proximal bicep Pain description: shooting pain that dissipates quickly Aggravating factors: gets worse at night Relieving factors: Ice, tylenol  5/10 pain at night   PATIENT GOALS "I just want to feel normal again, do the same movements"  OBJECTIVE:   FUNCTIONAL OUTCOME MEASURES: FOTO: 62.05  UPPER EXTREMITY ROM     Pt seated Active ROM Right eval  Shoulder flexion 90  Shoulder abduction 90  Shoulder internal rotation 90  Shoulder external rotation 60  Elbow Flexion  WNL  Elbow Extension WNL  (Blank rows = not tested)  Pt seated Passive  ROM Right eval  Shoulder flexion 160  Shoulder abduction 160  Shoulder internal rotation 90  Shoulder external rotation 65  Elbow flexion WNL  Elbow extension WNL  (Blank rows = not tested)   UPPER EXTREMITY MMT:     MMT Right eval  Shoulder flexion 3/5  Shoulder abduction 3/5  Shoulder internal rotation 4+/5  Shoulder external rotation 4+/5  Elbow flexion 3/5  Elbow extension 3/5  Wrist flexion 5/5  Wrist extension 5/5  Wrist ulnar deviation 5/5  Wrist radial deviation 5/5  Wrist pronation 3+/5  Wrist supination 3+/5  (Blank rows = not tested)  HAND FUNCTION: Grip strength: Right: 66 lbs;  Left: 65 lbs, Lateral pinch: Right: 18 lbs, Left: 19 lbs, and 3 point pinch: Right: 19 lbs, Left: 19 lbs   TODAY'S TREATMENT:  12/08/21 -Myofascial release to right upper arm, trapezius, and scapular regions to decrease pain and fascial restrictions and increase joint ROM -P/ROM: supine, shoulder flexion, abduction, er/IR, horizontal abduction, 5 reps -AA/ROM: supine, shoulder protraction, flexion, abduction, er/IR, horizontal abduction, 10 reps -AA/ROM: standing, shoulder protraction, flexion, abduction, er/IR, horizontal abduction, 10 reps -Wall wash: 1' flexion -PVC Pipe slide, 10 reps flexion -Pulleys: 1' flexion, 1' abduction   PATIENT EDUCATION: Education details: AA/ROM Person educated: Patient Education method: Explanation Education comprehension: verbalized understanding   HOME EXERCISE PROGRAM: 8/4: AA/ROM exercises  GOALS: Goals reviewed with patient? Yes  SHORT TERM GOALS: Target date: 12/29/21   Pt will be provided with and educated on HEP to improve mobility in RUE required for ADL completion.  Goal status: IN PROGRESS  2.  Pt will decrease pain in RUE to 3/10 or less in order to sleep for 4+ consecutive hours without waking due to pain.  Baseline:  Goal status: IN PROGRESS  3.  Pt will decrease RUE fascial restrictions to moderate amounts or less to improve mobility required for overhead reaching tasks.   Goal status: IN PROGRESS  4.  Pt will increase P/ROM in RUE to WNL to improve ability to perform dressing tasks with minimal compensatory techniques.   Goal status: IN PROGRESS  5.  Pt will increase strength in RUE to 4/5 to improve ability to perform meal preparation tasks such as lifting pots and pans.   Goal status: IN PROGRESS    LONG TERM GOALS: Target date: 01/26/22   Pt will decrease pain in RUE to 1/10 or less in order to sleep throughout the night without waking due to pain.   Goal status: IN PROGRESS  2.  Pt will decrease LUE fascial  restrictions to trace amounts or less to improve mobility required for reaching into cabinets to retreive food items from the top shelf.   Goal status: IN PROGRESS  3.  Pt will increase A/ROM of LUE to WNL to improve ability to reach overhead and behind back during dressing and bathing tasks.   Goal status: IN PROGRESS  4.  Pt will increase strength in LUE to 5/5 to improve ability to perform lifting tasks required child care.   Goal status: IN PROGRESS   ASSESSMENT:  CLINICAL IMPRESSION: Pt reports improvement in ability to perform reaching tasks. Initiated myofascial release, passive stretching, and AA/ROM today. Pt achieving ROM approximately 75% for flexion and er, abduction limited to approximately 60%.  Pt completing wall wash, pvc pipe slide, and pulleys, demonstrating full ROM at end of session. Provided HEP for AA/ROM. Verbal cuing and intermittent tactile assist for form and technique.    PLAN: OT FREQUENCY: 2x/week  OT DURATION: 8 weeks  PLANNED INTERVENTIONS: self care/ADL training, therapeutic exercise, therapeutic activity, manual therapy, scar mobilization, passive range of motion, electrical stimulation, ultrasound, moist heat, cryotherapy, patient/family education, energy conservation, coping strategies training, and DME and/or AE instructions   CONSULTED AND AGREED WITH PLAN OF CARE: Patient  PLAN FOR NEXT SESSION: follow up on HEP, continue with manual techniques and passive stretching,      Guadelupe Sabin, OTR/L  [phone] 12/08/2021, 9:41 AM

## 2021-12-08 NOTE — Patient Instructions (Signed)

## 2021-12-10 ENCOUNTER — Other Ambulatory Visit: Payer: Self-pay | Admitting: Obstetrics & Gynecology

## 2021-12-12 ENCOUNTER — Encounter (HOSPITAL_COMMUNITY): Payer: BC Managed Care – PPO

## 2021-12-12 ENCOUNTER — Encounter (HOSPITAL_COMMUNITY): Payer: BC Managed Care – PPO | Admitting: Physical Therapy

## 2021-12-14 ENCOUNTER — Encounter: Payer: Self-pay | Admitting: Internal Medicine

## 2021-12-14 ENCOUNTER — Encounter (HOSPITAL_COMMUNITY): Payer: BC Managed Care – PPO | Admitting: Physical Therapy

## 2021-12-14 ENCOUNTER — Ambulatory Visit: Payer: BC Managed Care – PPO | Admitting: Internal Medicine

## 2021-12-14 VITALS — BP 132/86 | HR 82 | Resp 18 | Ht 66.0 in | Wt 198.0 lb

## 2021-12-14 DIAGNOSIS — E669 Obesity, unspecified: Secondary | ICD-10-CM | POA: Diagnosis not present

## 2021-12-14 DIAGNOSIS — G43709 Chronic migraine without aura, not intractable, without status migrainosus: Secondary | ICD-10-CM

## 2021-12-14 DIAGNOSIS — E782 Mixed hyperlipidemia: Secondary | ICD-10-CM

## 2021-12-14 DIAGNOSIS — E039 Hypothyroidism, unspecified: Secondary | ICD-10-CM | POA: Diagnosis not present

## 2021-12-14 DIAGNOSIS — G43809 Other migraine, not intractable, without status migrainosus: Secondary | ICD-10-CM | POA: Diagnosis not present

## 2021-12-14 DIAGNOSIS — R7303 Prediabetes: Secondary | ICD-10-CM

## 2021-12-14 DIAGNOSIS — E559 Vitamin D deficiency, unspecified: Secondary | ICD-10-CM

## 2021-12-14 MED ORDER — EMGALITY 120 MG/ML ~~LOC~~ SOSY: 120.0000 mg | PREFILLED_SYRINGE | SUBCUTANEOUS | 5 refills | Status: DC

## 2021-12-14 MED ORDER — SEMAGLUTIDE-WEIGHT MANAGEMENT 2.4 MG/0.75ML ~~LOC~~ SOAJ: 2.4000 mg | SUBCUTANEOUS | 5 refills | Status: DC

## 2021-12-14 NOTE — Assessment & Plan Note (Signed)
BMI Readings from Last 3 Encounters:  12/14/21 31.96 kg/m  11/09/21 32.28 kg/m  09/05/21 33.89 kg/m    Continue to follow low-carb diet and perform moderate exercise/walking as tolerated Had good response with GLP-1 agonist in the past On [place] - initial BMI - 33.93

## 2021-12-14 NOTE — Assessment & Plan Note (Signed)
Lab Results  Component Value Date   TSH 1.730 08/14/2021   Chart review does not show any history of elevated TSH or low free T4 Was on NP thyroid 60 mg daily, appeared to be oversupplemented, tapered NP thyroid TSH and free T4 WNL now

## 2021-12-14 NOTE — Patient Instructions (Addendum)
Please continue taking medications as prescribed.  Please continue to follow low carb diet and perform moderate exercise/walking at least 150 mins/week.  Please get fasting blood tests done before the next visit. 

## 2021-12-14 NOTE — Assessment & Plan Note (Signed)
Well controlled with Emgality and as needed Imitrex Does not have a neurologist currently  From a thorough review of records, medications tried that can be used in migraine management include Tylenol, Fioricet, Decadron, Benadryl, Emgality, Prozac, gabapentin, ibuprofen, ketorolac injections and tablets, Reglan, Zofran, oxycodone, Phenergan, scopolamine patch, Imitrex tablet 100 mg, amitriptyline and nortriptyline contraindicated because she is already on an SSRI, Topamax contraindicated due to history of kidney stones

## 2021-12-14 NOTE — Progress Notes (Signed)
Established Patient Office Visit  Subjective:  Patient ID: Julia Rangel, female    DOB: Mar 28, 1986  Age: 36 y.o. MRN: [id number]  CC:  Chief Complaint  Patient presents with   Follow-up    4 month follow up hypothyroidism    HPI Julia Rangel is a 36 y.o. female with past medical history of migraine, IDA, HLD and obesity who presents for f/u of her chronic medical conditions.  Migraine: Currently well controlled with Emgality and as needed Imitrex.  She requires Imitrex very rarely now.  She has history of obesity, for which she has been following low-carb diet and has been placed on Wegovy.  She has lost about 16 lbs since the last visit.  Her thyroid function tests have been wnl without NP thyroid now.  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   Dysplasia of cervix    Family history of BRCA1 gene positive    Family history of breast cancer    Gestational diabetes    History of cesarean delivery 10/09/2016   X2 plans rpeat   Kidney stone 2011   Menorrhagia    Migraines    since age 12 per patient   Patient desires pregnancy 06/01/2013   PMDD (premenstrual dysphoric disorder)    PROZAC 20 MG BUT NONE WITH PREGNANCY   PONV (postoperative nausea and vomiting)    Vaginal Pap smear, abnormal     Past Surgical History:  Procedure Laterality Date   BICEPT TENODESIS Right 11/09/2021   Procedure: BICEPS TENODESIS;  Surgeon: Mordecai Rasmussen, MD;  Location: [facility];  Service: Orthopedics;  Laterality: Right;   BIOPSY N/A 05/04/2021   Procedure: BIOPSY;  Surgeon: Rogene Houston, MD;  [facility];  Service: Endoscopy;  Laterality: N/A;   CESAREAN SECTION  2010   CESAREAN SECTION N/A 03/29/2014   Procedure: REPEAT CESAREAN SECTION;  Surgeon: Florian Buff, MD;  Location: [facility];  Service: Obstetrics;  Laterality: N/A;   CESAREAN SECTION WITH BILATERAL TUBAL LIGATION Bilateral 05/08/2017   Procedure: CESAREAN SECTION  WITH BILATERAL TUBAL LIGATION;  Surgeon: Jonnie Kind, MD;  Location: [facility];  Service: Obstetrics;  Laterality: Bilateral;   COLONOSCOPY N/A 05/04/2021   Procedure: COLONOSCOPY;  Surgeon: Rogene Houston, MD;  [facility];  Service: Endoscopy;  Laterality: N/A;  1245   HYSTERECTOMY ABDOMINAL WITH SALPINGECTOMY N/A 09/15/2019   Procedure: HYSTERECTOMY ABDOMINAL and OPEN BILATERAL SALPINGECTOMY;  Surgeon: Jonnie Kind, MD;  Location: [facility];  Service: Gynecology;  Laterality: N/A;   KIDNEY STONE SURGERY  2010   stent   LEEP  2008   phylloid removed  2009   left breast   SHOULDER ARTHROSCOPY WITH DEBRIDEMENT AND BICEP TENDON REPAIR Right 11/09/2021   Procedure: SHOULDER ARTHROSCOPY WITH DEBRIDEMENT OF CALCIFIC TENDONITIS AND BICEP TENDON REPAIR;  Surgeon: Mordecai Rasmussen, MD;  Location: [facility];  Service: Orthopedics;  Laterality: Right;   TONSILLECTOMY     WISDOM TOOTH EXTRACTION      Family History  Problem Relation Age of Onset   Diabetes Maternal Grandmother    Breast cancer Maternal Grandmother        dx 40s-50   Diabetes Paternal Grandmother    Breast cancer Paternal Grandmother 63   COPD Paternal Grandfather    Heart disease Maternal Grandfather    Breast cancer Mother 67   BRCA 1/2 Mother  BRCA1 pos   Ovarian cancer Mother    Other Mother        peritoneal cancer   Colon polyps Maternal Uncle    SIDS Maternal Aunt        2 babies died of SIDS at 35 months    Social History   Socioeconomic History   Marital status: Married    Spouse name: Not on file   Number of children: 3   Years of education: Not on file   Highest education level: Not on file  Occupational History   Not on file  Tobacco Use   Smoking status: Never   Smokeless tobacco: Never  Vaping Use   Vaping Use: Never used  Substance and Sexual Activity   Alcohol use: Yes    Alcohol/week: 7.0 standard drinks of alcohol    Types: 7 Glasses of wine per week   Drug use:  No   Sexual activity: Not Currently    Birth control/protection: Surgical    Comment: tubal,hysterectomy  Other Topics Concern   Not on file  Social History Narrative   Married for 6 years.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   Social Determinants of Health   Financial Resource Strain: Not on file  Food Insecurity: Not on file  Transportation Needs: Not on file  Physical Activity: Not on file  Stress: Not on file  Social Connections: Not on file  Intimate Partner Violence: Not on file    Outpatient Medications Prior to Visit  Medication Sig Dispense Refill   acidophilus (RISAQUAD) CAPS capsule Take 1 capsule by mouth daily.     Ascorbic Acid (VITAMIN C PO) Take 1 tablet by mouth daily.     aspirin EC 81 MG tablet Take 1 tablet (81 mg total) by mouth in the morning and at bedtime. Swallow whole. 84 tablet 0   COLLAGEN PO Take 2 each by mouth daily. Gummies     FLUoxetine (PROZAC) 20 MG tablet Take 1 tablet by mouth once daily 30 tablet 11   FLUoxetine (PROZAC) 40 MG capsule Take 1 capsule by mouth once daily 30 capsule 11   SUMAtriptan (IMITREX) 100 MG tablet Take 100 mg by mouth every 2 (two) hours as needed for migraine. May repeat in 2 hours if headache persists or recurs.     trimethoprim (TRIMPEX) 100 MG tablet Take 1 tablet (100 mg total) by mouth at bedtime. (Patient not taking: Reported on 10/26/2021) 90 tablet 1   EMGALITY 120 MG/ML SOSY Inject 120 mg into the skin every 30 (thirty) days. 1 mL 3   Semaglutide-Weight Management 2.4 MG/0.75ML SOAJ Inject 2.4 mg into the skin once a week for 28 days. 3 mL 0   sulfamethoxazole-trimethoprim (BACTRIM DS) 800-160 MG tablet Take 1 tablet by mouth 2 (two) times daily. (Patient not taking: Reported on 10/26/2021) 10 tablet 0   WEGOVY 0.5 MG/0.5ML SOAJ INJECT 0.5 MG INTO THE SKIN ONCE A WEEK 4 mL 0   No facility-administered medications prior to visit.    No Known Allergies  ROS Review of Systems   Constitutional:  Negative for chills and fever.  HENT:  Negative for congestion, sinus pressure, sinus pain and sore throat.   Eyes:  Negative for pain and discharge.  Respiratory:  Negative for cough and shortness of breath.   Cardiovascular:  Negative for chest pain and palpitations.  Gastrointestinal:  Negative for abdominal pain, diarrhea, nausea and vomiting.  Endocrine: Negative for polydipsia and polyuria.  Genitourinary:  Negative for dysuria and hematuria.  Musculoskeletal:  Negative for neck pain and neck stiffness.  Skin:  Negative for rash.  Neurological:  Positive for tremors. Negative for dizziness and weakness.  Psychiatric/Behavioral:  Negative for agitation and behavioral problems.       Objective:    Physical Exam Vitals reviewed.  Constitutional:      General: She is not in acute distress.    Appearance: She is obese. She is not diaphoretic.  HENT:     Head: Normocephalic and atraumatic.     Nose: Nose normal.     Mouth/Throat:     Mouth: Mucous membranes are moist.  Eyes:     General: No scleral icterus.    Extraocular Movements: Extraocular movements intact.  Cardiovascular:     Rate and Rhythm: Normal rate and regular rhythm.     Pulses: Normal pulses.     Heart sounds: Normal heart sounds. No murmur heard. Pulmonary:     Breath sounds: Normal breath sounds. No wheezing or rales.  Musculoskeletal:     Cervical back: Neck supple. No tenderness.     Right lower leg: No edema.     Left lower leg: No edema.  Skin:    General: Skin is warm.     Findings: No rash.  Neurological:     General: No focal deficit present.     Mental Status: She is alert and oriented to person, place, and time.  Psychiatric:        Mood and Affect: Mood normal.        Behavior: Behavior normal.     BP 132/86 (BP Location: Right Arm, Patient Position: Sitting, Cuff Size: Normal)   Pulse 82   Resp 18   Ht '5\' 6"'  (1.676 m)   Wt 198 lb (89.8 kg)   LMP 07/14/2019  (Approximate)   SpO2 98%   BMI 31.96 kg/m  Wt Readings from Last 3 Encounters:  12/14/21 198 lb (89.8 kg)  11/09/21 200 lb (90.7 kg)  09/05/21 210 lb (95.3 kg)    Lab Results  Component Value Date   TSH 1.730 08/14/2021   Lab Results  Component Value Date   WBC 6.4 11/06/2021   HGB 13.2 11/06/2021   HCT 37.9 11/06/2021   MCV 90.7 11/06/2021   PLT 262 11/06/2021   Lab Results  Component Value Date   NA 138 11/06/2021   K 3.6 11/06/2021   CO2 24 11/06/2021   GLUCOSE 90 11/06/2021   BUN 14 11/06/2021   CREATININE 0.74 11/06/2021   BILITOT 0.4 03/17/2021   ALKPHOS 85 03/17/2021   AST 19 03/17/2021   ALT 20 03/17/2021   PROT 7.4 03/17/2021   ALBUMIN 4.5 03/17/2021   CALCIUM 9.5 11/06/2021   ANIONGAP 6 11/06/2021   EGFR 94 08/14/2021   Lab Results  Component Value Date   CHOL 199 03/17/2021   Lab Results  Component Value Date   HDL 63 03/17/2021   Lab Results  Component Value Date   LDLCALC 115 (H) 03/17/2021   Lab Results  Component Value Date   TRIG 119 03/17/2021   Lab Results  Component Value Date   CHOLHDL 3.2 03/17/2021   Lab Results  Component Value Date   HGBA1C 5.0 03/16/2020      Assessment & Plan:   Problem List Items Addressed This Visit       Cardiovascular and Mediastinum   Migraine - Primary    Well controlled with Emgality and  as needed Imitrex Does not have a neurologist currently  From a thorough review of records, medications tried that can be used in migraine management include Tylenol, Fioricet, Decadron, Benadryl, Emgality, Prozac, gabapentin, ibuprofen, ketorolac injections and tablets, Reglan, Zofran, oxycodone, Phenergan, scopolamine patch, Imitrex tablet 100 mg, amitriptyline and nortriptyline contraindicated because she is already on an SSRI, Topamax contraindicated due to history of kidney stones      Relevant Medications   EMGALITY 120 MG/ML SOSY   Other Relevant Orders   CMP14+EGFR   CBC with  Differential/Platelet     Endocrine   Hypothyroidism    Lab Results  Component Value Date   TSH 1.730 08/14/2021  Chart review does not show any history of elevated TSH or low free T4 Was on NP thyroid 60 mg daily, appeared to be oversupplemented, tapered NP thyroid TSH and free T4 WNL now       Relevant Orders   TSH + free T4     Other   Obesity (BMI 30.0-34.9)    BMI Readings from Last 3 Encounters:  12/14/21 31.96 kg/m  11/09/21 32.28 kg/m  09/05/21 33.89 kg/m   Continue to follow low-carb diet and perform moderate exercise/walking as tolerated Had good response with GLP-1 agonist in the past On Wegovy - initial BMI - 33.93       Relevant Medications   Semaglutide-Weight Management 2.4 MG/0.75ML SOAJ   Mixed hyperlipidemia    Last lipid profile reviewed Continue to follow low-carb diet      Relevant Orders   Lipid panel   Other Visit Diagnoses     Vitamin D deficiency       Relevant Orders   VITAMIN D 25 Hydroxy (Vit-D Deficiency, Fractures)   Prediabetes       Relevant Orders   Hemoglobin A1c       Meds ordered this encounter  Medications   Semaglutide-Weight Management 2.4 MG/0.75ML SOAJ    Sig: Inject 2.4 mg into the skin once a week.    Dispense:  3 mL    Refill:  5   EMGALITY 120 MG/ML SOSY    Sig: Inject 120 mg into the skin every 30 (thirty) days.    Dispense:  1 mL    Refill:  5    Follow-up: Return in about 6 months (around 06/16/2022) for Annual physical.    Lindell Spar, MD

## 2021-12-14 NOTE — Assessment & Plan Note (Signed)
Last lipid profile reviewed Continue to follow low-carb diet

## 2021-12-15 ENCOUNTER — Encounter (HOSPITAL_COMMUNITY): Payer: Self-pay

## 2021-12-15 ENCOUNTER — Ambulatory Visit (HOSPITAL_COMMUNITY): Payer: BC Managed Care – PPO

## 2021-12-15 DIAGNOSIS — M25611 Stiffness of right shoulder, not elsewhere classified: Secondary | ICD-10-CM

## 2021-12-15 DIAGNOSIS — R29898 Other symptoms and signs involving the musculoskeletal system: Secondary | ICD-10-CM

## 2021-12-15 DIAGNOSIS — M25511 Pain in right shoulder: Secondary | ICD-10-CM

## 2021-12-15 NOTE — Therapy (Signed)
OUTPATIENT OCCUPATIONAL THERAPY ORTHO TREATMENT  Patient Name: Julia Rangel MRN: [id number] DOB:1985/06/12, 36 y.o., female Today's Date: 12/15/2021  PCP: Ihor Dow, MD  REFERRING PROVIDER: Larena Glassman, MD   OT End of Session - 12/15/21 0905     Visit Number 3    Number of Visits 16    Date for OT Re-Evaluation 01/26/22   Mini reassessment 8/25   Authorization Type [place] Time Period no auth    OT Start Time 0906    OT Stop Time 0946    OT Time Calculation (min) 40 min    Activity Tolerance Patient tolerated treatment well    Behavior During Therapy [facility] for tasks assessed/performed              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   Dysplasia of cervix    Family history of BRCA1 gene positive    Family history of breast cancer    Gestational diabetes    History of cesarean delivery 10/09/2016   X2 plans rpeat   Kidney stone 2011   Menorrhagia    Migraines    since age 45 per patient   Patient desires pregnancy 06/01/2013   PMDD (premenstrual dysphoric disorder)    PROZAC 20 MG BUT NONE WITH PREGNANCY   PONV (postoperative nausea and vomiting)    Vaginal Pap smear, abnormal    Past Surgical History:  Procedure Laterality Date   BICEPT TENODESIS Right 11/09/2021   Procedure: BICEPS TENODESIS;  Surgeon: Mordecai Rasmussen, MD;  Location: [facility];  Service: Orthopedics;  Laterality: Right;   BIOPSY N/A 05/04/2021   Procedure: BIOPSY;  Surgeon: Rogene Houston, MD;  [facility];  Service: Endoscopy;  Laterality: N/A;   CESAREAN SECTION  2010   CESAREAN SECTION N/A 03/29/2014   Procedure: REPEAT CESAREAN SECTION;  Surgeon: Florian Buff, MD;  Location: [facility];  Service: Obstetrics;  Laterality: N/A;   CESAREAN SECTION WITH BILATERAL TUBAL LIGATION Bilateral 05/08/2017   Procedure: CESAREAN SECTION WITH BILATERAL TUBAL LIGATION;  Surgeon: Jonnie Kind, MD;  Location: [facility];  Service: Obstetrics;  Laterality: Bilateral;   COLONOSCOPY N/A 05/04/2021   Procedure: COLONOSCOPY;  Surgeon: Rogene Houston, MD;  [facility];  Service: Endoscopy;  Laterality: N/A;  1245   HYSTERECTOMY ABDOMINAL WITH SALPINGECTOMY N/A 09/15/2019   Procedure: HYSTERECTOMY ABDOMINAL and OPEN BILATERAL SALPINGECTOMY;  Surgeon: Jonnie Kind, MD;  Location: [facility];  Service: Gynecology;  Laterality: N/A;   KIDNEY STONE SURGERY  2010   stent   LEEP  2008   phylloid removed  2009   left breast   SHOULDER ARTHROSCOPY WITH DEBRIDEMENT AND BICEP TENDON REPAIR Right 11/09/2021   Procedure: SHOULDER ARTHROSCOPY WITH DEBRIDEMENT OF CALCIFIC TENDONITIS AND BICEP TENDON REPAIR;  Surgeon: Mordecai Rasmussen, MD;  Location: [facility];  Service: Orthopedics;  Laterality: Right;   TONSILLECTOMY     WISDOM TOOTH EXTRACTION     Patient Active Problem List   Diagnosis Date Noted   Acute cystitis without hematuria 08/14/2021   History of nephrolithiasis 08/14/2021   Chronic right shoulder pain 08/14/2021   Rectal bleeding 03/17/2021   Hypothyroidism 03/17/2021   Mixed hyperlipidemia 03/17/2021   S/P abdominal hysterectomy 09/15/2019   Family history of ovarian cancer 05/20/2019   Family history of breast cancer    Family history of BRCA1 gene  positive    Ovarian cyst 11/08/2016   Migraine 02/02/2015   Obesity (BMI 30.0-34.9) 02/02/2015   Personal history of malignant phylloides tumor of breast 09/19/2012   Severe dysplasia of cervix (CIN III) 09/19/2012    ONSET DATE: 11/09/21  REFERRING DIAG: M75.30 (ICD-10-CM) - Calcific bursitis of shoulder  THERAPY DIAG:  Acute pain of right shoulder  Stiffness of right shoulder, not elsewhere classified  Other symptoms and signs involving the musculoskeletal system  Rationale for Evaluation and Treatment Rehabilitation   SUBJECTIVE:   SUBJECTIVE STATEMENT: S: "I am able to do my hair and everything by myself. It feels really  good."  PERTINENT HISTORY: Pt presents s/p open biceps tenodesis and debridement of calcific tendinitis 11/09/21 following a fall. She was instructed to wear a sling for 3-4 weeks. Pt reports falling several days after surgery with pain for a day or two that has since gotten better. She reports that she heard a tear and a pop. She was seen by Dr. [name] on 7/21 and has another follow-up scheduled on 8/18. Pt was referred to OT for evaluation and treatment.     PRECAUTIONS: Shoulder and Other: Progress as tolerated, 5-10# weight limit, no resisted bicep work  PAIN:  Are you having pain? No    PATIENT GOALS "I just want to feel normal again, do the same movements"  OBJECTIVE:   FUNCTIONAL OUTCOME MEASURES: FOTO: 62.05  UPPER EXTREMITY ROM     Pt seated Active ROM Right eval  Shoulder flexion 90  Shoulder abduction 90  Shoulder internal rotation 90  Shoulder external rotation 60  Elbow Flexion  WNL  Elbow Extension WNL  (Blank rows = not tested)  Pt seated Passive  ROM Right eval  Shoulder flexion 160  Shoulder abduction 160  Shoulder internal rotation 90  Shoulder external rotation 65  Elbow flexion WNL  Elbow extension WNL  (Blank rows = not tested)   UPPER EXTREMITY MMT:     MMT Right eval  Shoulder flexion 3/5  Shoulder abduction 3/5  Shoulder internal rotation 4+/5  Shoulder external rotation 4+/5  Elbow flexion 3/5  Elbow extension 3/5  Wrist flexion 5/5  Wrist extension 5/5  Wrist ulnar deviation 5/5  Wrist radial deviation 5/5  Wrist pronation 3+/5  Wrist supination 3+/5  (Blank rows = not tested)  HAND FUNCTION: Grip strength: Right: 66 lbs; Left: 65 lbs, Lateral pinch: Right: 18 lbs, Left: 19 lbs, and 3 point pinch: Right: 19 lbs, Left: 19 lbs   TODAY'S TREATMENT:   12/15/21 -Myofascial release to right upper arm, trapezius, and scapular regions to decrease pain and fascial restrictions and increase joint ROM -AA/ROM: seated, shoulder  protraction, flexion, abduction, er/IR, horizontal abduction, 10 reps -A/ROM: seated, shoulder protraction, flexion, abduction, er/IR, horizontal abduction, 10 reps -Shoulder Isometrics: 5x5" flexion, extension, abduction, er/IR -Scapular strengthening: red theraband, 1x10 row, extension   12/08/21 -Myofascial release to right upper arm, trapezius, and scapular regions to decrease pain and fascial restrictions and increase joint ROM -P/ROM: supine, shoulder flexion, abduction, er/IR, horizontal abduction, 5 reps -AA/ROM: supine, shoulder protraction, flexion, abduction, er/IR, horizontal abduction, 10 reps -AA/ROM: standing, shoulder protraction, flexion, abduction, er/IR, horizontal abduction, 10 reps -Wall wash: 1' flexion -PVC Pipe slide, 10 reps flexion -Pulleys: 1' flexion, 1' abduction   PATIENT EDUCATION: Education details: Shoulder isometrics, theraband scapular strengthening  Person educated: Patient Education method: Explanation Education comprehension: verbalized understanding   HOME EXERCISE PROGRAM: 8/4: AA/ROM exercises 8/11: Shoulder isometrics, theraband scapular strengthening  GOALS: Goals reviewed with patient? Yes  SHORT TERM GOALS: Target date: 12/29/21   Pt will be provided with and educated on HEP to improve mobility in RUE required for ADL completion.   Goal status: IN PROGRESS  2.  Pt will decrease pain in RUE to 3/10 or less in order to sleep for 4+ consecutive hours without waking due to pain.  Baseline:  Goal status: IN PROGRESS  3.  Pt will decrease RUE fascial restrictions to moderate amounts or less to improve mobility required for overhead reaching tasks.   Goal status: IN PROGRESS  4.  Pt will increase P/ROM in RUE to WNL to improve ability to perform dressing tasks with minimal compensatory techniques.   Goal status: IN PROGRESS  5.  Pt will increase strength in RUE to 4/5 to improve ability to perform meal preparation tasks such as  lifting pots and pans.   Goal status: IN PROGRESS    LONG TERM GOALS: Target date: 01/26/22   Pt will decrease pain in RUE to 1/10 or less in order to sleep throughout the night without waking due to pain.   Goal status: IN PROGRESS  2.  Pt will decrease LUE fascial restrictions to trace amounts or less to improve mobility required for reaching into cabinets to retreive food items from the top shelf.   Goal status: IN PROGRESS  3.  Pt will increase A/ROM of LUE to WNL to improve ability to reach overhead and behind back during dressing and bathing tasks.   Goal status: IN PROGRESS  4.  Pt will increase strength in LUE to 5/5 to improve ability to perform lifting tasks required child care.   Goal status: IN PROGRESS   ASSESSMENT:  CLINICAL IMPRESSION: Pt reports completing her HEP daily, feeling end range restriction with AA/ROM flexion and er. She presents today with all AROM WNL and no pain. Following manual therapy to anterior shoulder and trapezius, pt reported less restriction with all movements. Good scapular alignment noted with all movements. Transitioned to strengthening today, shoulder isometrics and scapular strengthening. Therapist providing verbal and tactile cues following demonstration with pt able to demonstrate with good form at end of session. Added to HEP. Pt is progressing well with no increased pain and no concerns.     PLAN: OT FREQUENCY: 2x/week  OT DURATION: 8 weeks  PLANNED INTERVENTIONS: self care/ADL training, therapeutic exercise, therapeutic activity, manual therapy, scar mobilization, passive range of motion, electrical stimulation, ultrasound, moist heat, cryotherapy, patient/family education, energy conservation, coping strategies training, and DME and/or AE instructions   CONSULTED AND AGREED WITH PLAN OF CARE: Patient  PLAN FOR NEXT SESSION: follow up on HEP, continue with manual techniques, emphasize strengthening within  protocol    Flonnie Hailstone, [place], OTR/L [phone]  12/15/2021, 9:56 AM

## 2021-12-15 NOTE — Patient Instructions (Signed)
  Complete the following 2-3 a day. Hold for 5 seconds. Complete 5 sets for each.   1) SHOULDER - ISOMETRIC FLEXION  Gently push your fist forward into a wall with your elbow bent.    2) SHOULDER - ISOMETRIC EXTENSION  Gently push your a bent elbow back into a wall.    3) SHOULDER - ISOMETRIC INTERNAL ROTATION   Gently press your hand into a wall using the palm side of your hand.  Maintain a bent elbow the entire time.        4) SHOULDER - ISOMETRIC ADDUCTION  Gently push your elbow into the side of your body.   5) SHOULDER - ISOMETRIC ABDUCTION  Gently push your elbow out to the side into a wall with your elbow bent.             1) (Clinic) Extension / Flexion (Assist)   Face anchor, pull arms back, keeping elbow straight, and squeze shoulder blades together. Repeat 10-15 times. 1-3 times/day.   Copyright  [employer]. All rights reserved.   2) (Home) Retraction: Row - Bilateral (Anchor)   Facing anchor, arms reaching forward, pull hands toward stomach, keeping elbows bent and at your sides and pinching shoulder blades together. Repeat 10-15 times. 1-3 times/day.   Copyright  [employer]. All rights reserved.

## 2021-12-21 ENCOUNTER — Ambulatory Visit (HOSPITAL_COMMUNITY): Payer: BC Managed Care – PPO

## 2021-12-21 ENCOUNTER — Encounter (HOSPITAL_COMMUNITY): Payer: Self-pay

## 2021-12-21 ENCOUNTER — Encounter (HOSPITAL_COMMUNITY): Payer: BC Managed Care – PPO | Admitting: Physical Therapy

## 2021-12-21 DIAGNOSIS — R29898 Other symptoms and signs involving the musculoskeletal system: Secondary | ICD-10-CM

## 2021-12-21 DIAGNOSIS — M25511 Pain in right shoulder: Secondary | ICD-10-CM

## 2021-12-21 DIAGNOSIS — M25611 Stiffness of right shoulder, not elsewhere classified: Secondary | ICD-10-CM

## 2021-12-21 NOTE — Therapy (Signed)
OUTPATIENT OCCUPATIONAL THERAPY ORTHO TREATMENT  Patient Name: Julia Rangel MRN: [id number] DOB:1985-09-24, 36 y.o., female Today's Date: 12/21/2021  PCP: Ihor Dow, MD  REFERRING PROVIDER: Larena Glassman, MD   OCCUPATIONAL THERAPY DISCHARGE SUMMARY  Visits from Start of Care: 4  Current functional level related to goals / functional outcomes: Pt has met 5/5 short term and 4/5 long term goals. She reports no difficulty completing daily tasks and no deficits or concerns.    Remaining deficits: Pt presents with some decreased shoulder strength with abduction, addressed with HEP   Education / Equipment: HEP with theraband and education on precautions  Plan: Patient agrees to discharge. Patient is being discharged due to meeting the stated rehab goals.        OT End of Session - 12/21/21 0742     Visit Number 4    Number of Visits 16    Date for OT Re-Evaluation 01/26/22   Mini reassessment 8/25   Authorization Type [place] Time Period no auth    OT Start Time [phone]    OT Stop Time [phone]    OT Time Calculation (min) 35 min    Activity Tolerance Patient tolerated treatment well    Behavior During Therapy [facility] for tasks assessed/performed              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   Dysplasia of cervix    Family history of BRCA1 gene positive    Family history of breast cancer    Gestational diabetes    History of cesarean delivery 10/09/2016   X2 plans rpeat   Kidney stone 2011   Menorrhagia    Migraines    since age 15 per patient   Patient desires pregnancy 06/01/2013   PMDD (premenstrual dysphoric disorder)    PROZAC 20 MG BUT NONE WITH PREGNANCY   PONV (postoperative nausea and vomiting)    Vaginal Pap smear, abnormal    Past Surgical History:  Procedure Laterality Date   BICEPT TENODESIS Right 11/09/2021   Procedure: BICEPS TENODESIS;  Surgeon: Mordecai Rasmussen, MD;   Location: [facility];  Service: Orthopedics;  Laterality: Right;   BIOPSY N/A 05/04/2021   Procedure: BIOPSY;  Surgeon: Rogene Houston, MD;  Location: [facility];  Service: Endoscopy;  Laterality: N/A;   CESAREAN SECTION  2010   CESAREAN SECTION N/A 03/29/2014   Procedure: REPEAT CESAREAN SECTION;  Surgeon: Florian Buff, MD;  Location: [place] [facility];  Service: Obstetrics;  Laterality: N/A;   CESAREAN SECTION WITH BILATERAL TUBAL LIGATION Bilateral 05/08/2017   Procedure: CESAREAN SECTION WITH BILATERAL TUBAL LIGATION;  Surgeon: Jonnie Kind, MD;  Location: [place];  Service: Obstetrics;  Laterality: Bilateral;   COLONOSCOPY N/A 05/04/2021   Procedure: COLONOSCOPY;  Surgeon: Rogene Houston, MD;  [facility]: [facility];  Service: Endoscopy;  Laterality: N/A;  1245   HYSTERECTOMY ABDOMINAL WITH SALPINGECTOMY N/A 09/15/2019   Procedure: HYSTERECTOMY ABDOMINAL and OPEN BILATERAL SALPINGECTOMY;  Surgeon: Jonnie Kind, MD;  Location: [facility];  Service: Gynecology;  Laterality: N/A;   KIDNEY STONE SURGERY  2010   stent   LEEP  2008   phylloid removed  2009   left breast   SHOULDER ARTHROSCOPY WITH DEBRIDEMENT AND BICEP TENDON REPAIR Right 11/09/2021   Procedure: SHOULDER ARTHROSCOPY WITH DEBRIDEMENT OF CALCIFIC TENDONITIS AND BICEP TENDON REPAIR;  Surgeon: Mordecai Rasmussen,  MD;  Location: [facility];  Service: Orthopedics;  Laterality: Right;   TONSILLECTOMY     WISDOM TOOTH EXTRACTION     Patient Active Problem List   Diagnosis Date Noted   Acute cystitis without hematuria 08/14/2021   History of nephrolithiasis 08/14/2021   Chronic right shoulder pain 08/14/2021   Rectal bleeding 03/17/2021   Hypothyroidism 03/17/2021   Mixed hyperlipidemia 03/17/2021   S/P abdominal hysterectomy 09/15/2019   Family history of ovarian cancer 05/20/2019   Family history of breast cancer    Family history of BRCA1 gene positive    Ovarian cyst 11/08/2016   Migraine 02/02/2015   Obesity (BMI  30.0-34.9) 02/02/2015   Personal history of malignant phylloides tumor of breast 09/19/2012   Severe dysplasia of cervix (CIN III) 09/19/2012    ONSET DATE: 11/09/21  REFERRING DIAG: M75.30 (ICD-10-CM) - Calcific bursitis of shoulder  THERAPY DIAG:  Acute pain of right shoulder  Stiffness of right shoulder, not elsewhere classified  Other symptoms and signs involving the musculoskeletal system  Rationale for Evaluation and Treatment Rehabilitation   SUBJECTIVE:   SUBJECTIVE STATEMENT: S: "It feels like normal, I am only reminded of it with certain movements."  PERTINENT HISTORY: Pt presents s/p open biceps tenodesis and debridement of calcific tendinitis 11/09/21 following a fall. She was instructed to wear a sling for 3-4 weeks. Pt reports falling several days after surgery with pain for a day or two that has since gotten better. She reports that she heard a tear and a pop. She was seen by Dr. [name] on 7/21 and has another follow-up scheduled on 8/18. Pt was referred to OT for evaluation and treatment.     PRECAUTIONS: Shoulder and Other: Progress as tolerated, 5-10# weight limit, no resisted bicep work  PAIN:  Are you having pain? No    PATIENT GOALS "I just want to feel normal again, do the same movements"  OBJECTIVE:   FUNCTIONAL OUTCOME MEASURES: FOTO: 62.05  UPPER EXTREMITY ROM     Pt seated Active ROM Right eval Right  12/21/21  Shoulder flexion 90 155  Shoulder abduction 90 152  Shoulder internal rotation 90 90  Shoulder external rotation 60 78  Elbow Flexion  WNL WNL  Elbow Extension WNL WNL  (Blank rows = not tested)  Pt seated Passive  ROM Right eval Right  12/21/21  Shoulder flexion 160 172  Shoulder abduction 160 165  Shoulder internal rotation 90 90  Shoulder external rotation 65 83  Elbow flexion WNL WNL  Elbow extension WNL WNL  (Blank rows = not tested)   UPPER EXTREMITY MMT:     MMT Right eval Right 12/21/21  Shoulder flexion 3/5  4+/5  Shoulder abduction 3/5 4/5  Shoulder internal rotation 4+/5 5/5  Shoulder external rotation 4+/5 5/5  Elbow flexion 3/5 5/5  Elbow extension 3/5 5/5  Wrist flexion 5/5 5/5  Wrist extension 5/5 5/5  Wrist ulnar deviation 5/5 5/5  Wrist radial deviation 5/5 5/5  Wrist pronation 3+/5 5/5  Wrist supination 3+/5 5/5  (Blank rows = not tested)  HAND FUNCTION: Grip strength: Right: 66 lbs; Left: 65 lbs, Lateral pinch: Right: 18 lbs, Left: 19 lbs, and 3 point pinch: Right: 19 lbs, Left: 19 lbs  Grip strength: Right: 79 lbs Lateral pinch: Right: 20 lbs 3 point pinch: Right: 21 lbs  TODAY'S TREATMENT:   12/21/21 -1x10 forward flexion, yellow theraband, standing against wall -1x10 abduction, yellow theraband, standing against wall -1x10 bicep curl, yellow theraband, light  resistance, no more than 10 lbs -1x10 chest pull apart, yellow theraband  -1x10 triceps extension, yellow theraband  -Manual therapy: Soft tissue mobilization, myofascial releasee and trigger point to address remaining fascial restrictions in the upper trapezius and triceps/biceps region    12/15/21 -Myofascial release to right upper arm, trapezius, and scapular regions to decrease pain and fascial restrictions and increase joint ROM -AA/ROM: seated, shoulder protraction, flexion, abduction, er/IR, horizontal abduction, 10 reps -A/ROM: seated, shoulder protraction, flexion, abduction, er/IR, horizontal abduction, 10 reps -Shoulder Isometrics: 5x5" flexion, extension, abduction, er/IR -Scapular strengthening: red theraband, 1x10 row, extension   12/08/21 -Myofascial release to right upper arm, trapezius, and scapular regions to decrease pain and fascial restrictions and increase joint ROM -P/ROM: supine, shoulder flexion, abduction, er/IR, horizontal abduction, 5 reps -AA/ROM: supine, shoulder protraction, flexion, abduction, er/IR, horizontal abduction, 10 reps -AA/ROM: standing, shoulder protraction, flexion,  abduction, er/IR, horizontal abduction, 10 reps -Wall wash: 1' flexion -PVC Pipe slide, 10 reps flexion -Pulleys: 1' flexion, 1' abduction   PATIENT EDUCATION: Education details: Theraband flexion, abduction, chest pull Person educated: Patient Education method: Explanation, Demonstration, and Handouts Education comprehension: verbalized understanding and returned demonstration   HOME EXERCISE PROGRAM: 8/4: AA/ROM exercises 8/11: Shoulder isometrics, theraband scapular strengthening  8/17: Theraband flexion, abduction, chest pull  GOALS: Goals reviewed with patient? Yes  SHORT TERM GOALS: Target date: 12/29/21   Pt will be provided with and educated on HEP to improve mobility in RUE required for ADL completion.   Goal status: MET  2.  Pt will decrease pain in RUE to 3/10 or less in order to sleep for 4+ consecutive hours without waking due to pain.  Baseline:  Goal status: MET  3.  Pt will decrease RUE fascial restrictions to moderate amounts or less to improve mobility required for overhead reaching tasks.   Goal status: MET  4.  Pt will increase P/ROM in RUE to WNL to improve ability to perform dressing tasks with minimal compensatory techniques.   Goal status: MET  5.  Pt will increase strength in RUE to 4/5 to improve ability to perform meal preparation tasks such as lifting pots and pans.   Goal status: MET    LONG TERM GOALS: Target date: 01/26/22   Pt will decrease pain in RUE to 1/10 or less in order to sleep throughout the night without waking due to pain.   Goal status: MET  2.  Pt will decrease LUE fascial restrictions to trace amounts or less to improve mobility required for reaching into cabinets to retreive food items from the top shelf.   Goal status: MET  3.  Pt will increase A/ROM of LUE to WNL to improve ability to reach overhead and behind back during dressing and bathing tasks.   Goal status: MET  4.  Pt will increase strength in LUE to 5/5  to improve ability to perform lifting tasks required child care.   Goal status: IN PROGRESS   ASSESSMENT:  CLINICAL IMPRESSION: A Measurements taken with significant improvements noted with strength, ROM, and fascial restrictions. Discuss plan of care options with pt. She is progressing well through her protocol and does not report any pain or remaining restrictions or deficits. 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 Reviewed and provided protocol with pt added to HEP and reviewed. Pt able to demonstrate exercises for HEP with good form and muscle recruitment. She acknowledged protocol  and restrictions. Some remaining fascial restrictions addressed with manual at conclusion of session, encouraged pt to continue to massage gun and tennis ball for self soft tissue mobilization.     PLAN: OT FREQUENCY: Discharge with [place], OTD, OTR/L [phone]  12/21/2021, 10:18 AM

## 2021-12-22 ENCOUNTER — Ambulatory Visit (INDEPENDENT_AMBULATORY_CARE_PROVIDER_SITE_OTHER): Payer: BC Managed Care – PPO | Admitting: Orthopedic Surgery

## 2021-12-22 ENCOUNTER — Encounter: Payer: Self-pay | Admitting: Orthopedic Surgery

## 2021-12-22 DIAGNOSIS — M25511 Pain in right shoulder: Secondary | ICD-10-CM

## 2021-12-22 DIAGNOSIS — M7530 Calcific tendinitis of unspecified shoulder: Secondary | ICD-10-CM

## 2021-12-22 NOTE — Progress Notes (Signed)
Orthopaedic Postop Note  Assessment: Julia Rangel is a 36 y.o. female s/p open biceps tenodesis, and debridement of calcific tendinitis  DOS: 11/09/21  Plan: She is doing well.  Her range of motion is normal.  She has some residual weakness.  Continue to work on her exercises.  Okay to advance her activities gradually.  Follow-up 6 weeks   Follow-up: Return in about 6 weeks (around 02/02/2022). XR at next visit:  None   Subjective:  Chief Complaint  Patient presents with   Routine Post Op    S/p RT Bicep repair DOS 11/09/21    History of Present Illness: Julia Rangel is a 36 y.o. female who presents following the above stated procedure.  Surgery was approximately 6 weeks ago.  She is doing very well.  She has been discharged from therapy.  They have provided her with a home exercise program.  She has no pain.  She is not taking any Tylenol or ibuprofen.  With certain motions or activities, she does have some discomfort.  Her range of motion is much better, but not quite full.   Review of Systems: No fevers or chills No numbness or tingling No Chest Pain No shortness of breath   Objective: LMP 07/14/2019 (Approximate)   Physical Exam:  Right shoulder without swelling.  No bruising is appreciated.  160 degrees of forward flexion.  Internal rotation to her lumbar spine.  External rotation at her side is almost back to normal.  Strength in the right shoulder is 4/5 without discomfort.   IMAGING:  Imaging obtained today.  Mordecai Rasmussen, MD 12/22/2021 9:07 AM

## 2021-12-22 NOTE — Patient Instructions (Signed)
Keep working on exercises

## 2021-12-26 ENCOUNTER — Encounter (HOSPITAL_COMMUNITY): Payer: BC Managed Care – PPO | Admitting: Physical Therapy

## 2021-12-28 ENCOUNTER — Encounter (HOSPITAL_COMMUNITY): Payer: BC Managed Care – PPO

## 2022-01-02 ENCOUNTER — Encounter (HOSPITAL_COMMUNITY): Payer: BC Managed Care – PPO | Admitting: Physical Therapy

## 2022-01-04 ENCOUNTER — Encounter (HOSPITAL_COMMUNITY): Payer: BC Managed Care – PPO | Admitting: Physical Therapy

## 2022-01-09 ENCOUNTER — Encounter (HOSPITAL_COMMUNITY): Payer: BC Managed Care – PPO | Admitting: Physical Therapy

## 2022-01-11 ENCOUNTER — Encounter (HOSPITAL_COMMUNITY): Payer: BC Managed Care – PPO | Admitting: Physical Therapy

## 2022-01-30 ENCOUNTER — Encounter: Payer: BC Managed Care – PPO | Admitting: Orthopedic Surgery

## 2022-02-21 ENCOUNTER — Telehealth: Payer: BC Managed Care – PPO | Admitting: Emergency Medicine

## 2022-02-21 DIAGNOSIS — J069 Acute upper respiratory infection, unspecified: Secondary | ICD-10-CM

## 2022-02-21 DIAGNOSIS — H9202 Otalgia, left ear: Secondary | ICD-10-CM

## 2022-02-21 NOTE — Progress Notes (Signed)
E-Visit for Upper Respiratory Infection   We are sorry you are not feeling well.  Here is how we plan to help!  Thank you for the extra information on your symptoms. Most middle ear infections are related to nasal congestion that backs up into your ear. If you've got a lot of congestion on that left side, it would push out into your ear causing pain and pressure and it will also drain down your throat, causing throat pain. Most ear infections in both kids and adults go away on their own if you can relieve the congestion and get it to drain. The advice below for upper respiratory infections applies to your situation. If you feel your symptoms are more severe, you should get checked in person such as at an urgent care.    Based on what you have shared with me, it looks like you may have a viral upper respiratory infection.  Upper respiratory infections are caused by a large number of viruses; however, rhinovirus is the most common cause.   Symptoms vary from person to person, with common symptoms including sore throat, cough, fatigue or lack of energy and feeling of general discomfort.  A low-grade fever of up to 100.4 may present, but is often uncommon.  Symptoms vary however, and are closely related to a person's age or underlying illnesses.  The most common symptoms associated with an upper respiratory infection are nasal discharge or congestion, cough, sneezing, headache and pressure in the ears and face.  These symptoms usually persist for about 3 to 10 days, but can last up to 2 weeks.  It is important to know that upper respiratory infections do not cause serious illness or complications in most cases.    Upper respiratory infections can be transmitted from person to person, with the most common method of transmission being a person's hands.  The virus is able to live on the skin and can infect other persons for up to 2 hours after direct contact.  Also, these can be transmitted when someone coughs or  sneezes; thus, it is important to cover the mouth to reduce this risk.  To keep the spread of the illness at [place], good hand hygiene is very important.  This is an infection that is most likely caused by a virus. There are no specific treatments other than to help you with the symptoms until the infection runs its course.  We are sorry you are not feeling well.  Here is how we plan to help!   For nasal congestion, you may use an oral decongestants such as Mucinex D or if you have glaucoma or high blood pressure use plain Mucinex.    Saline nasal spray or nasal drops can help and can safely be used as often as needed for congestion. Try using saline irrigation, such as with a neti pot, several times a day while you are sick. Many neti pots come with salt packets premeasured to use to make saline. If you use your own salt, make sure it is kosher salt or sea salt (don't use table salt as it has iodine in it and you don't need that in your nose). Use distilled water to make saline. If you mix your own saline using your own salt, the recipe is 1/4 teaspoon salt in 1 cup warm water. Using saline irrigation can help prevent and treat sinus infections.   If you do not have a history of heart disease, hypertension, diabetes or thyroid disease, prostate/bladder issues  or glaucoma, you may also use Sudafed to treat nasal congestion.  It is highly recommended that you consult with a pharmacist or your primary care physician to ensure this medication is safe for you to take.     If you have a cough, you may use cough suppressants such as Delsym and Robitussin.   If you have a sore or scratchy throat, use a saltwater gargle-  to  teaspoon of salt dissolved in a 4-ounce to 8-ounce glass of warm water.  Gargle the solution for approximately 15-30 seconds and then spit.  It is important not to swallow the solution.  You can also use throat lozenges/cough drops and Chloraseptic spray to help with throat pain or  discomfort.  Warm or cold liquids can also be helpful in relieving throat pain.  For headache, pain or general discomfort, you can use Ibuprofen or Tylenol as directed.   Some authorities believe that zinc sprays or the use of Echinacea may shorten the course of your symptoms.   HOME CARE Only take medications as instructed by your medical team. Be sure to drink plenty of fluids. Water is fine as well as fruit juices, sodas and electrolyte beverages. You may want to stay away from caffeine or alcohol. If you are nauseated, try taking small sips of liquids. How do you know if you are getting enough fluid? Your urine should be a pale yellow or almost colorless. Get rest. Taking a steamy shower or using a humidifier may help nasal congestion and ease sore throat pain. You can place a towel over your head and breathe in the steam from hot water coming from a faucet. Using a saline nasal spray works much the same way. Cough drops, hard candies and sore throat lozenges may ease your cough. Avoid close contacts especially the very young and the elderly Cover your mouth if you cough or sneeze Always remember to wash your hands.   GET HELP RIGHT AWAY IF: You develop worsening fever. If your symptoms do not improve within 10 days You develop yellow or green discharge from your nose over 3 days. You have coughing fits You develop a severe head ache or visual changes. You develop shortness of breath, difficulty breathing or start having chest pain Your symptoms persist after you have completed your treatment plan  MAKE SURE YOU  Understand these instructions. Will watch your condition. Will get help right away if you are not doing well or get worse.  Thank you for choosing an e-visit.  Your e-visit answers were reviewed by a board certified advanced clinical practitioner to complete your personal care plan. Depending upon the condition, your plan could have included both over the counter or  prescription medications.  Please review your pharmacy choice. Make sure the pharmacy is open so you can pick up prescription now. If there is a problem, you may contact your provider through [employer] and have the prescription routed to another pharmacy.  Your safety is important to [place]. If you have drug allergies check your prescription carefully.   For the next 24 hours you can use MyChart to ask questions about today's visit, request a non-urgent call back, or ask for a work or school excuse. You will get an email in the next two days asking about your experience. I hope that your e-visit has been valuable and will speed your recovery.   I have spent 5 minutes in review of e-visit questionnaire, review and updating patient chart, medical decision making and  response to patient.   Willeen Cass, PhD, FNP-BC

## 2022-05-22 ENCOUNTER — Other Ambulatory Visit: Payer: Self-pay | Admitting: Internal Medicine

## 2022-05-22 ENCOUNTER — Other Ambulatory Visit: Payer: Self-pay | Admitting: Advanced Practice Midwife

## 2022-05-22 DIAGNOSIS — G43809 Other migraine, not intractable, without status migrainosus: Secondary | ICD-10-CM

## 2022-05-23 ENCOUNTER — Other Ambulatory Visit: Payer: Self-pay | Admitting: Internal Medicine

## 2022-05-23 DIAGNOSIS — G43809 Other migraine, not intractable, without status migrainosus: Secondary | ICD-10-CM

## 2022-06-10 ENCOUNTER — Other Ambulatory Visit: Payer: Self-pay | Admitting: Internal Medicine

## 2022-06-10 DIAGNOSIS — E669 Obesity, unspecified: Secondary | ICD-10-CM

## 2022-06-15 ENCOUNTER — Other Ambulatory Visit: Payer: Self-pay | Admitting: Internal Medicine

## 2022-06-15 DIAGNOSIS — G43809 Other migraine, not intractable, without status migrainosus: Secondary | ICD-10-CM

## 2022-06-18 ENCOUNTER — Encounter: Payer: BC Managed Care – PPO | Admitting: Internal Medicine

## 2022-06-18 ENCOUNTER — Encounter: Payer: Self-pay | Admitting: Internal Medicine

## 2022-06-22 ENCOUNTER — Ambulatory Visit: Payer: BC Managed Care – PPO | Admitting: Gastroenterology

## 2022-07-02 NOTE — Progress Notes (Unsigned)
GI Office Note    Referring Provider: Lindell Spar, MD Primary Care Physician:  Lindell Spar, MD Primary Gastroenterologist: Harvel Quale, MD, previously Dr. Laural Golden  Date:  07/04/2022  ID:  Julia Rangel, DOB March 03, 1986, MRN [id number]   Chief Complaint   Chief Complaint  Patient presents with   Rectal Bleeding    Patient here today due to seeing bright red blood and some times darker stools than normal. Patient says this has been an ongoing thing for a year now. She has some occasional lower abdominal pain. She says her rectum hurts at times. She has issues also with diarrhea, and has some fecal incontinence.     History of Present Illness  Julia Rangel is a 37 y.o. female with a history of anemia, gestational diabetes, PMDD, migraines presenting today with complaint of rectal bleeding.  Last office visit 04/24/2021 also for rectal bleeding.  Ongoing rectal bleeding for few months associated with watery diarrhea.  Having 2-3 BMs per day.  Diarrhea worse at night.  Also with lower abdominal pain centrally located and pain improves after bowel movement but will shortly return after.  Also endorses rectal discomfort/burning/itching.  Also with mucus in her stools.  Denies any family history of IBD.  Hemoglobin in November 2022 14.1.  Without shortness of breath, dizziness, fatigue.  TSH noted to be 0.028 in November 2022 and had been low for the past 9 months.  NSAID use only as needed.  Denied tobacco use.  Drinks 1 alcoholic drink per day.  Reported mother had peritoneal cancer at age 62.  C. difficile and GI pathogen panel ordered.  Also scheduled for colonoscopy.  Advised to use Imodium as needed.  Suspected that her diarrhea could be secondary to hyperthyroidism.  Colonoscopy 05/04/21: -examined TI normal -normal colon s/p biopsy -external hemorrhoids -hypertrophied anal papillae -Advised bentyl '10mg'$  TID, avoid NSAIDs  Today: Diarrhea: Having 1-2 per day small  amounts usually. Reports it is leaking at times, this began about 2 weeks ago. Was having a lot of burning at that time as well, was painful and hurt to sit down. Abdominal pain does improve with defecation. Still has gallbladder. Unable to tell if she has mucous in her stools.  Has good appetite. Has not been taking dicyclomine.   No N/V.  Rectal bleeding, hemorrhoids: All the time bleeding. Amount varies - last week looked like menstrual cycle (does not have uterus). At times its not much at all but occurs when she wipes.    Current Outpatient Medications  Medication Sig Dispense Refill   acidophilus (RISAQUAD) CAPS capsule Take 1 capsule by mouth daily.     dicyclomine (BENTYL) 10 MG capsule Take 1 capsule (10 mg total) by mouth 2 (two) times daily as needed for spasms. 60 capsule 1   EMGALITY 120 MG/ML SOAJ INJECT 1 ML SUBCUTANEOUSLY  ONCE EVERY MONTH 1 mL 0   FLUoxetine (PROZAC) 20 MG tablet Take 1 tablet by mouth once daily 30 tablet 11   FLUoxetine (PROZAC) 40 MG capsule Take 1 capsule by mouth once daily 30 capsule 11   SUMAtriptan (IMITREX) 100 MG tablet Take 100 mg by mouth every 2 (two) hours as needed for migraine. May repeat in 2 hours if headache persists or recurs.     tretinoin (RETIN-A) 0.025 % cream Apply topically at bedtime.     WEGOVY 2.4 MG/0.75ML SOAJ INJECT 2.4 MG SUBCUTANEOUSLY ONCE A WEEK 4 mL 0   No current facility-administered medications for this  visit.    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   Dysplasia of cervix    Family history of BRCA1 gene positive    Family history of breast cancer    Gestational diabetes    History of cesarean delivery 10/09/2016   X2 plans rpeat   Kidney stone 2011   Menorrhagia    Migraines    since age 28 per patient   Patient desires pregnancy 06/01/2013   PMDD (premenstrual dysphoric disorder)    PROZAC 20 MG BUT NONE WITH PREGNANCY   PONV (postoperative nausea and  vomiting)    Vaginal Pap smear, abnormal     Past Surgical History:  Procedure Laterality Date   BICEPT TENODESIS Right 11/09/2021   Procedure: BICEPS TENODESIS;  Surgeon: Mordecai Rasmussen, MD;  Location: [facility];  Service: Orthopedics;  Laterality: Right;   BIOPSY N/A 05/04/2021   Procedure: BIOPSY;  Surgeon: Rogene Houston, MD;  [facility];  Service: Endoscopy;  Laterality: N/A;   CESAREAN SECTION  2010   CESAREAN SECTION N/A 03/29/2014   Procedure: REPEAT CESAREAN SECTION;  Surgeon: Florian Buff, MD;  Location: [place] [facility];  Service: Obstetrics;  Laterality: N/A;   CESAREAN SECTION WITH BILATERAL TUBAL LIGATION Bilateral 05/08/2017   Procedure: CESAREAN SECTION WITH BILATERAL TUBAL LIGATION;  Surgeon: Jonnie Kind, MD;  Location: [place];  Service: Obstetrics;  Laterality: Bilateral;   COLONOSCOPY N/A 05/04/2021   Procedure: COLONOSCOPY;  Surgeon: Rogene Houston, MD;  [facility];  Service: Endoscopy;  Laterality: N/A;  1245   HYSTERECTOMY ABDOMINAL WITH SALPINGECTOMY N/A 09/15/2019   Procedure: HYSTERECTOMY ABDOMINAL and OPEN BILATERAL SALPINGECTOMY;  Surgeon: Jonnie Kind, MD;  Location: [facility];  Service: Gynecology;  Laterality: N/A;   KIDNEY STONE SURGERY  2010   stent   LEEP  2008   phylloid removed  2009   left breast   SHOULDER ARTHROSCOPY WITH DEBRIDEMENT AND BICEP TENDON REPAIR Right 11/09/2021   Procedure: SHOULDER ARTHROSCOPY WITH DEBRIDEMENT OF CALCIFIC TENDONITIS AND BICEP TENDON REPAIR;  Surgeon: Mordecai Rasmussen, MD;  Location: [facility];  Service: Orthopedics;  Laterality: Right;   TONSILLECTOMY     WISDOM TOOTH EXTRACTION      Family History  Problem Relation Age of Onset   Diabetes Maternal Grandmother    Breast cancer Maternal Grandmother        dx 40s-50   Diabetes Paternal Grandmother    Breast cancer Paternal Grandmother 36   COPD Paternal Grandfather    Heart disease Maternal Grandfather    Breast cancer Mother 56   BRCA  1/2 Mother        BRCA1 pos   Ovarian cancer Mother    Other Mother        peritoneal cancer   Colon polyps Maternal Uncle    SIDS Maternal Aunt        2 babies died of SIDS at 21 months    Allergies as of 07/03/2022   (No Known Allergies)    Social History   Socioeconomic History   Marital status: Married    Spouse name: Not on file   Number of children: 3   Years of education: Not on file   Highest education level: Not on file  Occupational History   Not on file  Tobacco Use   Smoking status: Never   Smokeless tobacco: Never  Vaping Use  Vaping Use: Never used  Substance and Sexual Activity   Alcohol use: Yes    Alcohol/week: 7.0 standard drinks of alcohol    Types: 7 Glasses of wine per week   Drug use: No   Sexual activity: Not Currently    Birth control/protection: Surgical    Comment: tubal,hysterectomy  Other Topics Concern   Not on file  Social History Narrative   Married for 6 years.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   Social Determinants of Health   Financial Resource Strain: Not on file  Food Insecurity: Not on file  Transportation Needs: Not on file  Physical Activity: Not on file  Stress: Not on file  Social Connections: Not on file     Review of Systems   Gen: Denies fever, chills, anorexia. Denies fatigue, weakness, weight loss.  CV: Denies chest pain, palpitations, syncope, peripheral edema, and claudication. Resp: Denies dyspnea at rest, cough, wheezing, coughing up blood, and pleurisy. GI: See HPI Derm: Denies rash, itching, dry skin Psych: Denies depression, anxiety, memory loss, confusion. No homicidal or suicidal ideation.  Heme: Denies bruising, bleeding, and enlarged lymph nodes.   Physical Exam   BP (!) 98/58 (BP Location: Left Arm, Patient Position: Sitting, Cuff Size: Large)   Pulse 87   Temp (!) 97.5 F (36.4 C) (Temporal)   Ht '5\' 6"'$  (1.676 m)   Wt 174 lb 11.2 oz (79.2 kg)   LMP  07/14/2019 (Approximate)   BMI 28.20 kg/m   General:   Alert and oriented. No distress noted. Pleasant and cooperative.  Head:  Normocephalic and atraumatic. Eyes:  Conjuctiva clear without scleral icterus. Mouth:  Oral mucosa pink and moist. Good dentition. No lesions. Rectal: hemorrhoid tissue left lateral. Good tone. Pain with rectal exam.  Msk:  Symmetrical without gross deformities. Normal posture. Extremities:  Without edema. Neurologic:  Alert and  oriented x4 Psych:  Alert and cooperative. Normal mood and affect.   Assessment  Julia Rangel is a 37 y.o. female with a history of anemia, gestational diabetes, PMDD, migraines presenting today with complaint of rectal bleeding.  Rectal bleeding, anal fissure. hemorrhoids: History of rectal bleeding. Last colonoscopy in December 2022 with external hemorrhoids and hypertrophied anal papillae, otherwise normal. Has frequent toilet tissue hematochezia however last week had much more rectal bleeding. Rectal exam today with good tone,  noted hemorrhoid tissue and minor mucosal prolapse but with knife like pain on DRE. Suspect possible anal fissure as cause of pain and possibly bleeding. Will treat with compounded [facility] hemorrhoid cream and follow up in a few weeks to reassess. Hemorrhoid banding pamphlet provided. Pending labs and exam at follow up, may consider repeat colonoscopy.   Diarrhea, incontinence?: Noted 2-3 BM daily in the past however currently only having 1-2 per day. Denies mucous that she is aware of. Does have bleeding as noted above. Has occasional seepage of stool that began a few weeks ago. Has had some rectal pain and some abdominal pain however rectal pain improves with defecation but can worsen her rectal pain. Has not been taking dicyclomine as states she never had a prescription. Advised will trial dicyclomine as needed for abdominal cramping and diarrhea. Will check labs including CBC, CMP, CRP, celiac  serologies, and TSH as she has a history of low TSH  in the past with normal T4. Seepage/possible incontinence likely in the setting of possible fissure and mucosal prolapse, possible candidate for hemorrhoid banding as stated above.   PLAN  CBC, CMP, CRP, TSH, T4, celiac serologies Hemorrhoid banding pamphlet Compounded cream from [facility] with lidocaine and nifedipine 0.2% QID for 1 -2 weeks then as needed Dicyclomine 10 mg up to BID as needed Follow up in 3-4 weeks   Venetia Night, MSN, FNP-BC, AGACNP-BC [facility]  I have reviewed the note and agree with the APP's assessment as described in this progress note  Maylon Peppers, MD Gastroenterology and Hepatology [facility]

## 2022-07-03 ENCOUNTER — Encounter: Payer: Self-pay | Admitting: Gastroenterology

## 2022-07-03 ENCOUNTER — Ambulatory Visit (INDEPENDENT_AMBULATORY_CARE_PROVIDER_SITE_OTHER): Payer: BC Managed Care – PPO | Admitting: Gastroenterology

## 2022-07-03 VITALS — BP 98/58 | HR 87 | Temp 97.5°F | Ht 66.0 in | Wt 174.7 lb

## 2022-07-03 DIAGNOSIS — K625 Hemorrhage of anus and rectum: Secondary | ICD-10-CM

## 2022-07-03 DIAGNOSIS — K640 First degree hemorrhoids: Secondary | ICD-10-CM | POA: Diagnosis not present

## 2022-07-03 DIAGNOSIS — R197 Diarrhea, unspecified: Secondary | ICD-10-CM

## 2022-07-03 DIAGNOSIS — K602 Anal fissure, unspecified: Secondary | ICD-10-CM | POA: Diagnosis not present

## 2022-07-03 DIAGNOSIS — R159 Full incontinence of feces: Secondary | ICD-10-CM

## 2022-07-03 MED ORDER — DICYCLOMINE HCL 10 MG PO CAPS: 10.0000 mg | ORAL_CAPSULE | Freq: Two times a day (BID) | ORAL | 1 refills | Status: DC | PRN

## 2022-07-03 NOTE — Patient Instructions (Addendum)
I will call in a compounded hemorrhoid cream to [facility].  You will apply this 3-4 times daily for 1 week and then use as needed.  I have sent in dicyclomine for you to use 5 mg up to twice daily as needed for diarrhea and abdominal cramping/pain.  Hold in the setting of constipation.  I have ordered some labs for you have completed.  You may take these lab slips with you to [facility] at your earliest convenience.  We will plan to follow-up in 3-4 weeks to see how you are doing regarding your rectal pain and rectal bleeding.  It was a pleasure to see you today. I want to create trusting relationships with patients. If you receive a survey regarding your visit,  I greatly appreciate you taking time to fill this out on paper or through your MyChart. I value your feedback.  Venetia Night, MSN, FNP-BC, AGACNP-BC [facility]

## 2022-07-05 ENCOUNTER — Encounter: Payer: Self-pay | Admitting: Radiology

## 2022-07-08 ENCOUNTER — Other Ambulatory Visit: Payer: Self-pay | Admitting: Internal Medicine

## 2022-07-08 DIAGNOSIS — E669 Obesity, unspecified: Secondary | ICD-10-CM

## 2022-07-12 LAB — CBC
Hematocrit: 36.1 % (ref 34.0–46.6)
Hemoglobin: 12.4 g/dL (ref 11.1–15.9)
MCH: 32.8 pg (ref 26.6–33.0)
MCHC: 34.3 g/dL (ref 31.5–35.7)
MCV: 96 fL (ref 79–97)
Platelets: 299 10*3/uL (ref 150–450)
RBC: 3.78 x10E6/uL (ref 3.77–5.28)
RDW: 12.3 % (ref 11.7–15.4)
WBC: 5.8 10*3/uL (ref 3.4–10.8)

## 2022-07-12 LAB — COMPREHENSIVE METABOLIC PANEL
ALT: 13 IU/L (ref 0–32)
AST: 13 IU/L (ref 0–40)
Albumin/Globulin Ratio: 1.9 (ref 1.2–2.2)
Albumin: 4.4 g/dL (ref 3.9–4.9)
Alkaline Phosphatase: 59 IU/L (ref 44–121)
BUN/Creatinine Ratio: 16 (ref 9–23)
BUN: 10 mg/dL (ref 6–20)
Bilirubin Total: 0.2 mg/dL (ref 0.0–1.2)
CO2: 22 mmol/L (ref 20–29)
Calcium: 9.2 mg/dL (ref 8.7–10.2)
Chloride: 102 mmol/L (ref 96–106)
Creatinine, Ser: 0.64 mg/dL (ref 0.57–1.00)
Globulin, Total: 2.3 g/dL (ref 1.5–4.5)
Glucose: 81 mg/dL (ref 70–99)
Potassium: 3.9 mmol/L (ref 3.5–5.2)
Sodium: 137 mmol/L (ref 134–144)
Total Protein: 6.7 g/dL (ref 6.0–8.5)
eGFR: 116 mL/min/{1.73_m2} (ref >59)

## 2022-07-12 LAB — CELIAC DISEASE PANEL
Endomysial IgA: NEGATIVE
IgA/Immunoglobulin A, Serum: 166 mg/dL (ref 87–352)
Transglutaminase IgA: <2 U/mL (ref 0–3)

## 2022-07-12 LAB — C-REACTIVE PROTEIN: CRP: 6 mg/L (ref 0–10)

## 2022-07-12 LAB — TSH+FREE T4
Free T4: 1.07 ng/dL (ref 0.82–1.77)
TSH: 1.48 u[IU]/mL (ref 0.450–4.500)

## 2022-07-16 ENCOUNTER — Encounter (INDEPENDENT_AMBULATORY_CARE_PROVIDER_SITE_OTHER): Payer: Self-pay

## 2022-07-16 MED ORDER — PEG 3350-KCL-NA BICARB-NACL 420 G PO SOLR: 4000.0000 mL | Freq: Once | ORAL | 0 refills | Status: AC

## 2022-07-16 NOTE — Addendum Note (Signed)
Addended by: Vicente Males on: 07/16/2022 02:42 PM   Modules accepted: Orders

## 2022-07-17 ENCOUNTER — Encounter (INDEPENDENT_AMBULATORY_CARE_PROVIDER_SITE_OTHER): Payer: Self-pay

## 2022-07-18 ENCOUNTER — Encounter (INDEPENDENT_AMBULATORY_CARE_PROVIDER_SITE_OTHER): Payer: Self-pay | Admitting: *Deleted

## 2022-07-18 ENCOUNTER — Telehealth (INDEPENDENT_AMBULATORY_CARE_PROVIDER_SITE_OTHER): Payer: Self-pay | Admitting: Gastroenterology

## 2022-07-18 NOTE — Telephone Encounter (Signed)
Pt sent my chart requesting doctor note.  I will need a doctors note for April 1st and 2nd, is there a way it can be sent to me so I can give it to my boss? Please advise. Thank you

## 2022-07-18 NOTE — Telephone Encounter (Signed)
Please let her know we will send a note by mail. This will only be for April the 2nd, unless she works at night (she should start the bowel prep the day before in the evening, not before that)  Hi Ann, Please send her by mail a note stating she will be excused for 4/2  Thanks

## 2022-07-18 NOTE — Telephone Encounter (Signed)
Mychart message sent to patient.

## 2022-07-29 LAB — CBC WITH DIFFERENTIAL/PLATELET
Basophils Absolute: 0 10*3/uL (ref 0.0–0.2)
Basos: 1 %
EOS (ABSOLUTE): 0.1 10*3/uL (ref 0.0–0.4)
Eos: 2 %
Hematocrit: 37.3 % (ref 34.0–46.6)
Hemoglobin: 12.5 g/dL (ref 11.1–15.9)
Immature Grans (Abs): 0 10*3/uL (ref 0.0–0.1)
Immature Granulocytes: 0 %
Lymphocytes Absolute: 1.9 10*3/uL (ref 0.7–3.1)
Lymphs: 31 %
MCH: 31.9 pg (ref 26.6–33.0)
MCHC: 33.5 g/dL (ref 31.5–35.7)
MCV: 95 fL (ref 79–97)
Monocytes Absolute: 0.4 10*3/uL (ref 0.1–0.9)
Monocytes: 7 %
Neutrophils Absolute: 3.6 10*3/uL (ref 1.4–7.0)
Neutrophils: 59 %
Platelets: 276 10*3/uL (ref 150–450)
RBC: 3.92 x10E6/uL (ref 3.77–5.28)
RDW: 11.9 % (ref 11.7–15.4)
WBC: 6 10*3/uL (ref 3.4–10.8)

## 2022-07-29 LAB — CMP14+EGFR
ALT: 11 IU/L (ref 0–32)
AST: 15 IU/L (ref 0–40)
Albumin/Globulin Ratio: 1.5 (ref 1.2–2.2)
Albumin: 4.2 g/dL (ref 3.9–4.9)
Alkaline Phosphatase: 66 IU/L (ref 44–121)
BUN/Creatinine Ratio: 13 (ref 9–23)
BUN: 9 mg/dL (ref 6–20)
Bilirubin Total: 0.3 mg/dL (ref 0.0–1.2)
CO2: 23 mmol/L (ref 20–29)
Calcium: 9.5 mg/dL (ref 8.7–10.2)
Chloride: 100 mmol/L (ref 96–106)
Creatinine, Ser: 0.72 mg/dL (ref 0.57–1.00)
Globulin, Total: 2.8 g/dL (ref 1.5–4.5)
Glucose: 101 mg/dL — ABNORMAL HIGH (ref 70–99)
Potassium: 4.3 mmol/L (ref 3.5–5.2)
Sodium: 137 mmol/L (ref 134–144)
Total Protein: 7 g/dL (ref 6.0–8.5)
eGFR: 110 mL/min/{1.73_m2} (ref >59)

## 2022-07-29 LAB — LIPID PANEL
Chol/HDL Ratio: 2.8 ratio (ref 0.0–4.4)
Cholesterol, Total: 175 mg/dL (ref 100–199)
HDL: 62 mg/dL (ref >39)
LDL Chol Calc (NIH): 90 mg/dL (ref 0–99)
Triglycerides: 129 mg/dL (ref 0–149)
VLDL Cholesterol Cal: 23 mg/dL (ref 5–40)

## 2022-07-29 LAB — TSH+FREE T4
Free T4: 1.1 ng/dL (ref 0.82–1.77)
TSH: 1.99 u[IU]/mL (ref 0.450–4.500)

## 2022-07-29 LAB — HEMOGLOBIN A1C
Est. average glucose Bld gHb Est-mCnc: 91 mg/dL
Hgb A1c MFr Bld: 4.8 % (ref 4.8–5.6)

## 2022-07-29 LAB — VITAMIN D 25 HYDROXY (VIT D DEFICIENCY, FRACTURES): Vit D, 25-Hydroxy: 36.9 ng/mL (ref 30.0–100.0)

## 2022-07-29 NOTE — Progress Notes (Unsigned)
GI Office Note    Referring Provider: Lindell Spar, MD Primary Care Physician:  Lindell Spar, MD Primary Gastroenterologist: Harvel Quale, MD  Date:  07/30/2022  ID:  Julia Rangel, DOB 1986/01/29, MRN [id number]   Chief Complaint   Chief Complaint  Patient presents with   Follow-up    Follow up hemorrhoids. Pt states no better and bleeding    History of Present Illness  Julia Rangel is a 37 y.o. female with a history of anemia, gestational diabetes, PMDD, migraines presenting today for follow up.   Last office visit 07/03/22. Diarrhea 1-2 small per day with occasional leaking and also having burning. Previously having pain to sit. Abd pain improves with defecation. Unsure about mucous in stools. Good appetite. Not taking dicyclomine.  No N/V. Having rectal bleeding all the time. Provided hemorrhoid banding pamphlet. Compounded cream for anal fissure called in. Given dicyclomine 10 mg BID as needed for pain and diarrhea.   Labs 07/10/22: celiac panel negative. Thyroid normal. CRP wnl. CBC and CMP wnl. Given ongoing diarrhea and rectal bleeding she was scheduled for colonoscopy.   Colonoscopy scheduled for 08/07/22.    Today: Rectal pain is a little better as long as she does not have a BM. Now only with a BM. Was unable to get compounded cream. Just got the call the other day that they were able. Still having a fair amount of rectal bleeding and with every BM. No bleeding in between. Still having a little leaking.   Has been using the dicyclomine for abdominal pain and does not see much of a difference with this. Pain is constant. Dull cramp that is always there.  Always primarily located in the lower abdomen.  Sometimes she gets a feeling like she will pass out in the act of defecation. Still going once to twice daily and small amount if diarrhea each time.   Has been on the probiotic for 2 years.    Current Outpatient Medications  Medication Sig Dispense Refill    EMGALITY 120 MG/ML SOAJ INJECT 1 ML SUBCUTANEOUSLY  ONCE EVERY MONTH 1 mL 0   FLUoxetine (PROZAC) 20 MG tablet Take 1 tablet by mouth once daily 30 tablet 11   FLUoxetine (PROZAC) 40 MG capsule Take 1 capsule by mouth once daily 30 capsule 11   hydrocortisone 2.5 % cream Apply 1 Application topically 2 (two) times daily as needed.     hyoscyamine (LEVSIN) 0.125 MG tablet Take 1 tablet (0.125 mg total) by mouth every 8 (eight) hours as needed. 30 tablet 0   SUMAtriptan (IMITREX) 100 MG tablet Take 100 mg by mouth every 2 (two) hours as needed for migraine. May repeat in 2 hours if headache persists or recurs.     tretinoin (RETIN-A) 0.025 % cream Apply topically at bedtime.     WEGOVY 2.4 MG/0.75ML SOAJ INJECT 2.4 MG SUBCUTANEOUSLY ONCE A WEEK 4 mL 0   acidophilus (RISAQUAD) CAPS capsule Take 1 capsule by mouth daily. (Patient not taking: Reported on 07/30/2022)     No current facility-administered medications for this visit.    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   Dysplasia of cervix    Family history of BRCA1 gene positive    Family history of breast cancer    Gestational diabetes    History of cesarean delivery 10/09/2016   X2 plans rpeat   Kidney stone 2011   Menorrhagia  Migraines    since age 56 per patient   Patient desires pregnancy 06/01/2013   PMDD (premenstrual dysphoric disorder)    PROZAC 20 MG BUT NONE WITH PREGNANCY   PONV (postoperative nausea and vomiting)    Vaginal Pap smear, abnormal     Past Surgical History:  Procedure Laterality Date   BICEPT TENODESIS Right 11/09/2021   Procedure: BICEPS TENODESIS;  Surgeon: Mordecai Rasmussen, MD;  Location: [facility];  Service: Orthopedics;  Laterality: Right;   BIOPSY N/A 05/04/2021   Procedure: BIOPSY;  Surgeon: Rogene Houston, MD;  [facility];  Service: Endoscopy;  Laterality: N/A;   CESAREAN SECTION  2010   CESAREAN SECTION N/A 03/29/2014   Procedure:  REPEAT CESAREAN SECTION;  Surgeon: Florian Buff, MD;  Location: [place] [facility];  Service: Obstetrics;  Laterality: N/A;   CESAREAN SECTION WITH BILATERAL TUBAL LIGATION Bilateral 05/08/2017   Procedure: CESAREAN SECTION WITH BILATERAL TUBAL LIGATION;  Surgeon: Jonnie Kind, MD;  Location: [place];  Service: Obstetrics;  Laterality: Bilateral;   COLONOSCOPY N/A 05/04/2021   Procedure: COLONOSCOPY;  Surgeon: Rogene Houston, MD;  [facility];  Service: Endoscopy;  Laterality: N/A;  1245   HYSTERECTOMY ABDOMINAL WITH SALPINGECTOMY N/A 09/15/2019   Procedure: HYSTERECTOMY ABDOMINAL and OPEN BILATERAL SALPINGECTOMY;  Surgeon: Jonnie Kind, MD;  Location: [facility];  Service: Gynecology;  Laterality: N/A;   KIDNEY STONE SURGERY  2010   stent   LEEP  2008   phylloid removed  2009   left breast   SHOULDER ARTHROSCOPY WITH DEBRIDEMENT AND BICEP TENDON REPAIR Right 11/09/2021   Procedure: SHOULDER ARTHROSCOPY WITH DEBRIDEMENT OF CALCIFIC TENDONITIS AND BICEP TENDON REPAIR;  Surgeon: Mordecai Rasmussen, MD;  Location: [facility];  Service: Orthopedics;  Laterality: Right;   TONSILLECTOMY     WISDOM TOOTH EXTRACTION      Family History  Problem Relation Age of Onset   Diabetes Maternal Grandmother    Breast cancer Maternal Grandmother        dx 40s-50   Diabetes Paternal Grandmother    Breast cancer Paternal Grandmother 51   COPD Paternal Grandfather    Heart disease Maternal Grandfather    Breast cancer Mother 50   BRCA 1/2 Mother        BRCA1 pos   Ovarian cancer Mother    Other Mother        peritoneal cancer   Colon polyps Maternal Uncle    SIDS Maternal Aunt        2 babies died of SIDS at 84 months    Allergies as of 07/30/2022   (No Known Allergies)    Social History   Socioeconomic History   Marital status: Married    Spouse name: Not on file   Number of children: 3   Years of education: Not on file   Highest education level: Not on file  Occupational History    Not on file  Tobacco Use   Smoking status: Never   Smokeless tobacco: Never  Vaping Use   Vaping Use: Never used  Substance and Sexual Activity   Alcohol use: Yes    Alcohol/week: 7.0 standard drinks of alcohol    Types: 7 Glasses of wine per week   Drug use: No   Sexual activity: Not Currently    Birth control/protection: Surgical    Comment: tubal,hysterectomy  Other Topics Concern   Not on file  Social History Narrative   Married for 6  years.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   Social Determinants of Health   Financial Resource Strain: Not on file  Food Insecurity: Not on file  Transportation Needs: Not on file  Physical Activity: Not on file  Stress: Not on file  Social Connections: Not on file     Review of Systems   Gen: Denies fever, chills, anorexia. Denies fatigue, weakness, weight loss.  CV: Denies chest pain, palpitations, syncope, peripheral edema, and claudication. Resp: Denies dyspnea at rest, cough, wheezing, coughing up blood, and pleurisy. GI: See HPI Derm: Denies rash, itching, dry skin Psych: Denies depression, anxiety, memory loss, confusion. No homicidal or suicidal ideation.  Heme: Denies bruising, bleeding, and enlarged lymph nodes.   Physical Exam   BP 97/66   Pulse 79   Temp (!) 97.5 F (36.4 C)   Ht 5\' 6"  (1.676 m)   Wt 171 lb 3.2 oz (77.7 kg)   LMP 07/14/2019 (Approximate)   BMI 27.63 kg/m   General:   Alert and oriented. No distress noted. Pleasant and cooperative.  Head:  Normocephalic and atraumatic. Eyes:  Conjuctiva clear without scleral icterus. Mouth:  Oral mucosa pink and moist. Good dentition. No lesions. Lungs:  Clear to auscultation bilaterally. No wheezes, rales, or rhonchi. No distress.  Heart:  S1, S2 present without murmurs appreciated.  Abdomen:  +BS, soft, non-tender and non-distended. No rebound or guarding. No HSM or masses noted. Rectal: deferred Msk:  Symmetrical without  gross deformities. Normal posture. Extremities:  Without edema. Neurologic:  Alert and  oriented x4 Psych:  Alert and cooperative. Normal mood and affect.   Assessment  Julia Rangel is a 37 y.o. female with a history of anemia, gestational diabetes, PMDD, migraines presenting today for follow up of rectal bleeidng and diarrhea.   Rectal bleeding, anal fissure, hemorrhoids: Was unable to get cream since her last visit due to system issue at the pharmacy. Will go pick it up today.  Was having constant rectal pain, currently only having pain with bowel movement.  Still having a fair amount of rectal bleeding with every bowel movement without bleeding.  Still having some fecal/blood seepage.  Encouraged to use the compounded hemorrhoid fissure cream 3-4 times daily for about 3 weeks and if she has improvement in symptoms and we could consider hemorrhoid banding in the future pending colonoscopy findings.  Diarrhea, ?IBS: Continues to have 1-2 small bowel movements per day, usually loose in nature.  No change in abdominal pain with twice daily.  Continues to have constant dull cramp in her lower abdomen that intensifies during the act of defecation.  Has continued on probiotic although this is more for immune purposes.  Advised to stop dicyclomine will trial sublingual Levsin every 8 hours as needed for abdominal cramping.  Advised to hold this medication if she begins to experience any constipation and that she should hold prior to performing her colonoscopy.  Will proceed with upcoming colonoscopy for further evaluation of her diarrhea as well as rectal bleeding.  Work note provided for this.  PLAN   Stop dicyclomine and trial Levsin SL every 8 hours as needed for abdominal cramping.  Start compounded cream 3-4 times daily for 3 weeks.  Will hold Wegovy today. Provided work note for 08/07/22 for procedure.  Follow up in 6-8 weeks.    Venetia Night, MSN, FNP-BC, AGACNP-BC [facility]  I have reviewed the note and agree with the APP's assessment as described in this  progress note  Maylon Peppers, MD Gastroenterology and Hepatology [facility]

## 2022-07-29 NOTE — H&P (View-Only) (Signed)
 GI Office Note    Referring Provider: Patel, Rutwik K, MD Primary Care Physician:  Patel, Rutwik K, MD Primary Gastroenterologist: Daniel Castaneda-Mayorga, MD  Date:  07/30/2022  ID:  Julia Rangel, DOB 10/29/1985, MRN [id number]   Chief Complaint   Chief Complaint  Patient presents with   Follow-up    Follow up hemorrhoids. Pt states no better and bleeding    History of Present Illness  [name] is a 37 y.o. female with a history of anemia, gestational diabetes, PMDD, migraines presenting today for follow up.   Last office visit 07/03/22. Diarrhea 1-2 small per day with occasional leaking and also having burning. Previously having pain to sit. Abd pain improves with defecation. Unsure about mucous in stools. Good appetite. Not taking dicyclomine.  No N/V. Having rectal bleeding all the time. Provided hemorrhoid banding pamphlet. Compounded cream for anal fissure called in. Given dicyclomine 10 mg BID as needed for pain and diarrhea.   Labs 07/10/22: celiac panel negative. Thyroid normal. CRP wnl. CBC and CMP wnl. Given ongoing diarrhea and rectal bleeding she was scheduled for colonoscopy.   Colonoscopy scheduled for 08/07/22.    Today: Rectal pain is a little better as long as she does not have a BM. Now only with a BM. Was unable to get compounded cream. Just got the call the other day that they were able. Still having a fair amount of rectal bleeding and with every BM. No bleeding in between. Still having a little leaking.   Has been using the dicyclomine for abdominal pain and does not see much of a difference with this. Pain is constant. Dull cramp that is always there.  Always primarily located in the lower abdomen.  Sometimes she gets a feeling like she will pass out in the act of defecation. Still going once to twice daily and small amount if diarrhea each time.   Has been on the probiotic for 2 years.    Current Outpatient Medications  Medication Sig Dispense Refill    EMGALITY 120 MG/ML SOAJ INJECT 1 ML SUBCUTANEOUSLY  ONCE EVERY MONTH 1 mL 0   FLUoxetine (PROZAC) 20 MG tablet Take 1 tablet by mouth once daily 30 tablet 11   FLUoxetine (PROZAC) 40 MG capsule Take 1 capsule by mouth once daily 30 capsule 11   hydrocortisone 2.5 % cream Apply 1 Application topically 2 (two) times daily as needed.     hyoscyamine (LEVSIN) 0.125 MG tablet Take 1 tablet (0.125 mg total) by mouth every 8 (eight) hours as needed. 30 tablet 0   SUMAtriptan (IMITREX) 100 MG tablet Take 100 mg by mouth every 2 (two) hours as needed for migraine. May repeat in 2 hours if headache persists or recurs.     tretinoin (RETIN-A) 0.025 % cream Apply topically at bedtime.     WEGOVY 2.4 MG/0.75ML SOAJ INJECT 2.4 MG SUBCUTANEOUSLY ONCE A WEEK 4 mL 0   acidophilus (RISAQUAD) CAPS capsule Take 1 capsule by mouth daily. (Patient not taking: Reported on 07/30/2022)     No current facility-administered medications for this visit.    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   Dysplasia of cervix    Family history of BRCA1 gene positive    Family history of breast cancer    Gestational diabetes    History of cesarean delivery 10/09/2016   X2 plans rpeat   Kidney stone 2011   Menorrhagia      Migraines    since age 18 per patient   Patient desires pregnancy 06/01/2013   PMDD (premenstrual dysphoric disorder)    PROZAC 20 MG BUT NONE WITH PREGNANCY   PONV (postoperative nausea and vomiting)    Vaginal Pap smear, abnormal     Past Surgical History:  Procedure Laterality Date   BICEPT TENODESIS Right 11/09/2021   Procedure: BICEPS TENODESIS;  Surgeon: Cairns, Mark A, MD;  Location: [facility];  Service: Orthopedics;  Laterality: Right;   BIOPSY N/A 05/04/2021   Procedure: BIOPSY;  Surgeon: Rehman, Najeeb U, MD;  [facility];  Service: Endoscopy;  Laterality: N/A;   CESAREAN SECTION  2010   CESAREAN SECTION N/A 03/29/2014   Procedure:  REPEAT CESAREAN SECTION;  Surgeon: Luther H Eure, MD;  Location: [facility];  Service: Obstetrics;  Laterality: N/A;   CESAREAN SECTION WITH BILATERAL TUBAL LIGATION Bilateral 05/08/2017   Procedure: CESAREAN SECTION WITH BILATERAL TUBAL LIGATION;  Surgeon: Ferguson, John V, MD;  [facility];  Service: Obstetrics;  Laterality: Bilateral;   COLONOSCOPY N/A 05/04/2021   Procedure: COLONOSCOPY;  Surgeon: Rehman, Najeeb U, MD;  [facility];  Service: Endoscopy;  Laterality: N/A;  1245   HYSTERECTOMY ABDOMINAL WITH SALPINGECTOMY N/A 09/15/2019   Procedure: HYSTERECTOMY ABDOMINAL and OPEN BILATERAL SALPINGECTOMY;  Surgeon: Ferguson, John V, MD;  Location: [facility];  Service: Gynecology;  Laterality: N/A;   KIDNEY STONE SURGERY  2010   stent   LEEP  2008   phylloid removed  2009   left breast   SHOULDER ARTHROSCOPY WITH DEBRIDEMENT AND BICEP TENDON REPAIR Right 11/09/2021   Procedure: SHOULDER ARTHROSCOPY WITH DEBRIDEMENT OF CALCIFIC TENDONITIS AND BICEP TENDON REPAIR;  Surgeon: Cairns, Mark A, MD;  Location: [facility];  Service: Orthopedics;  Laterality: Right;   TONSILLECTOMY     WISDOM TOOTH EXTRACTION      Family History  Problem Relation Age of Onset   Diabetes Maternal Grandmother    Breast cancer Maternal Grandmother        dx 40s-50   Diabetes Paternal Grandmother    Breast cancer Paternal Grandmother 69   COPD Paternal Grandfather    Heart disease Maternal Grandfather    Breast cancer Mother 58   BRCA 1/2 Mother        BRCA1 pos   Ovarian cancer Mother    Other Mother        peritoneal cancer   Colon polyps Maternal Uncle    SIDS Maternal Aunt        2 babies died of SIDS at 4 months    Allergies as of 07/30/2022   (No Known Allergies)    Social History   Socioeconomic History   Marital status: Married    Spouse name: Not on file   Number of children: 3   Years of education: Not on file   Highest education level: Not on file  Occupational History    Not on file  Tobacco Use   Smoking status: Never   Smokeless tobacco: Never  Vaping Use   Vaping Use: Never used  Substance and Sexual Activity   Alcohol use: Yes    Alcohol/week: 7.0 standard drinks of alcohol    Types: 7 Glasses of wine per week   Drug use: No   Sexual activity: Not Currently    Birth control/protection: Surgical    Comment: tubal,hysterectomy  Other Topics Concern   Not on file  Social History Narrative   Married for 6   years.Lives with husband and 3 kids.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   Social Determinants of Health   Financial Resource Strain: Not on file  Food Insecurity: Not on file  Transportation Needs: Not on file  Physical Activity: Not on file  Stress: Not on file  Social Connections: Not on file     Review of Systems   Gen: Denies fever, chills, anorexia. Denies fatigue, weakness, weight loss.  CV: Denies chest pain, palpitations, syncope, peripheral edema, and claudication. Resp: Denies dyspnea at rest, cough, wheezing, coughing up blood, and pleurisy. GI: See HPI Derm: Denies rash, itching, dry skin Psych: Denies depression, anxiety, memory loss, confusion. No homicidal or suicidal ideation.  Heme: Denies bruising, bleeding, and enlarged lymph nodes.   Physical Exam   BP 97/66   Pulse 79   Temp (!) 97.5 F (36.4 C)   Ht 5' 6" (1.676 m)   Wt 171 lb 3.2 oz (77.7 kg)   LMP 07/14/2019 (Approximate)   BMI 27.63 kg/m   General:   Alert and oriented. No distress noted. Pleasant and cooperative.  Head:  Normocephalic and atraumatic. Eyes:  Conjuctiva clear without scleral icterus. Mouth:  Oral mucosa pink and moist. Good dentition. No lesions. Lungs:  Clear to auscultation bilaterally. No wheezes, rales, or rhonchi. No distress.  Heart:  S1, S2 present without murmurs appreciated.  Abdomen:  +BS, soft, non-tender and non-distended. No rebound or guarding. No HSM or masses noted. Rectal: deferred Msk:  Symmetrical without  gross deformities. Normal posture. Extremities:  Without edema. Neurologic:  Alert and  oriented x4 Psych:  Alert and cooperative. Normal mood and affect.   Assessment  Julia Rangel is a 37 y.o. female with a history of anemia, gestational diabetes, PMDD, migraines presenting today for follow up of rectal bleeidng and diarrhea.   Rectal bleeding, anal fissure, hemorrhoids: Was unable to get cream since her last visit due to system issue at the pharmacy. Will go pick it up today.  Was having constant rectal pain, currently only having pain with bowel movement.  Still having a fair amount of rectal bleeding with every bowel movement without bleeding.  Still having some fecal/blood seepage.  Encouraged to use the compounded hemorrhoid fissure cream 3-4 times daily for about 3 weeks and if she has improvement in symptoms and we could consider hemorrhoid banding in the future pending colonoscopy findings.  Diarrhea, ?IBS: Continues to have 1-2 small bowel movements per day, usually loose in nature.  No change in abdominal pain with twice daily.  Continues to have constant dull cramp in her lower abdomen that intensifies during the act of defecation.  Has continued on probiotic although this is more for immune purposes.  Advised to stop dicyclomine will trial sublingual Levsin every 8 hours as needed for abdominal cramping.  Advised to hold this medication if she begins to experience any constipation and that she should hold prior to performing her colonoscopy.  Will proceed with upcoming colonoscopy for further evaluation of her diarrhea as well as rectal bleeding.  Work note provided for this.  PLAN   Stop dicyclomine and trial Levsin SL every 8 hours as needed for abdominal cramping.  Start compounded cream 3-4 times daily for 3 weeks.  Will hold Wegovy today. Provided work note for 08/07/22 for procedure.  Follow up in 6-8 weeks.    Jaidynn Balster, MSN, FNP-BC, AGACNP-BC [facility]  I have reviewed the note and agree with the APP's assessment as described in this   progress note  Daniel Castaneda, MD Gastroenterology and Hepatology [facility] 

## 2022-07-30 ENCOUNTER — Ambulatory Visit (INDEPENDENT_AMBULATORY_CARE_PROVIDER_SITE_OTHER): Payer: BC Managed Care – PPO | Admitting: Internal Medicine

## 2022-07-30 ENCOUNTER — Ambulatory Visit (INDEPENDENT_AMBULATORY_CARE_PROVIDER_SITE_OTHER): Payer: BC Managed Care – PPO | Admitting: Gastroenterology

## 2022-07-30 ENCOUNTER — Encounter: Payer: Self-pay | Admitting: Internal Medicine

## 2022-07-30 ENCOUNTER — Encounter: Payer: Self-pay | Admitting: Gastroenterology

## 2022-07-30 VITALS — BP 107/67 | HR 80 | Ht 66.0 in | Wt 168.0 lb

## 2022-07-30 VITALS — BP 97/66 | HR 79 | Temp 97.5°F | Ht 66.0 in | Wt 171.2 lb

## 2022-07-30 DIAGNOSIS — K640 First degree hemorrhoids: Secondary | ICD-10-CM

## 2022-07-30 DIAGNOSIS — K602 Anal fissure, unspecified: Secondary | ICD-10-CM

## 2022-07-30 DIAGNOSIS — K529 Noninfective gastroenteritis and colitis, unspecified: Secondary | ICD-10-CM | POA: Insufficient documentation

## 2022-07-30 DIAGNOSIS — E039 Hypothyroidism, unspecified: Secondary | ICD-10-CM

## 2022-07-30 DIAGNOSIS — R197 Diarrhea, unspecified: Secondary | ICD-10-CM | POA: Diagnosis not present

## 2022-07-30 DIAGNOSIS — Z0001 Encounter for general adult medical examination with abnormal findings: Secondary | ICD-10-CM

## 2022-07-30 DIAGNOSIS — K625 Hemorrhage of anus and rectum: Secondary | ICD-10-CM

## 2022-07-30 DIAGNOSIS — E782 Mixed hyperlipidemia: Secondary | ICD-10-CM | POA: Diagnosis not present

## 2022-07-30 DIAGNOSIS — E669 Obesity, unspecified: Secondary | ICD-10-CM

## 2022-07-30 DIAGNOSIS — G43809 Other migraine, not intractable, without status migrainosus: Secondary | ICD-10-CM

## 2022-07-30 MED ORDER — WEGOVY 2.4 MG/0.75ML ~~LOC~~ SOAJ: 2.4000 mg | SUBCUTANEOUS | 0 refills | Status: DC

## 2022-07-30 MED ORDER — HYOSCYAMINE SULFATE 0.125 MG PO TABS: 0.1250 mg | ORAL_TABLET | Freq: Three times a day (TID) | ORAL | 0 refills | Status: DC | PRN

## 2022-07-30 MED ORDER — EMGALITY 120 MG/ML ~~LOC~~ SOAJ: SUBCUTANEOUS | 5 refills | Status: DC

## 2022-07-30 NOTE — Assessment & Plan Note (Signed)
Last lipid profile reviewed - improved with weight loss Continue to follow low-carb diet

## 2022-07-30 NOTE — Patient Instructions (Signed)
Please continue taking medications as prescribed.  Please continue to follow low carb diet and perform moderate exercise/walking at least 150 mins/week. 

## 2022-07-30 NOTE — Assessment & Plan Note (Signed)

## 2022-07-30 NOTE — Patient Instructions (Signed)
Start using the compounded hemorrhoid cream 3-4 times daily for the next 3 -4 weeks and monitor for improvement. We should have more answers regarding your rectal bleeding and diarrhea after your colonoscopy.   I have sent in [place] for you to take up to 3 times daily for abdominal cramping. Hold in the setting of constipation.  Please do not take your [facility] today. You may resume after your colonoscopy.   It was a pleasure to see you today. I want to create trusting relationships with patients. If you receive a survey regarding your visit,  I greatly appreciate you taking time to fill this out on paper or through your MyChart. I value your feedback.  Julia Night, MSN, FNP-BC, AGACNP-BC [facility]

## 2022-07-30 NOTE — Assessment & Plan Note (Signed)
Lab Results  Component Value Date   TSH 1.990 07/27/2022   Chart review does not show any history of elevated TSH or low free T4 Was on NP thyroid 60 mg daily, appeared to be oversupplemented, tapered NP thyroid TSH and free T4 WNL now

## 2022-07-30 NOTE — Assessment & Plan Note (Signed)
BMI Readings from Last 3 Encounters:  07/30/22 27.12 kg/m  07/30/22 27.63 kg/m  07/03/22 28.20 kg/m    Continue to follow low-carb diet and perform moderate exercise/walking as tolerated Had good response with GLP-1 agonist in the past On Wegovy - initial BMI - 33.93 - has had excellent response with CMP and lipid profile as well, but she is going to lose coverage - strict low carb diet advised

## 2022-07-30 NOTE — Assessment & Plan Note (Addendum)
Chronic, watery to loose BM - IBS-D? Followed by GI On Levsin now Planned to get repeat colonoscopy

## 2022-07-30 NOTE — Assessment & Plan Note (Signed)
Intermittent episodes of BRBPR Has hemorrhoids, uses Anusol cream as needed Followed by GI

## 2022-07-30 NOTE — Progress Notes (Signed)
Established Patient Office Visit  Subjective:  Patient ID: Julia Rangel, female    DOB: June 26, 1985  Age: 37 y.o. MRN: [id number]  CC:  Chief Complaint  Patient presents with   Annual Exam    HPI Julia Rangel is a 37 y.o. female with past medical history of migraine, IDA, HLD and obesity who presents for annual physical.  Migraine: Currently well controlled with Emgality and as needed Imitrex.  She requires Imitrex very rarely now.   She has history of obesity, for which she has been following low-carb diet and has been on Wegovy.  She has lost about 30 lbs since the last visit. She is going to lose coverage for [facility] in the next month.  She comes complains of chronic diarrhea, watery to loose BM, about 3 times in a day.  She has abdominal pain and cramping before and during bowel movement.  She also has rectal bleeding after bowel movement from hemorrhoids.  She has tried dicyclomine without much relief.  She had visit with GI today and has been placed on Levsin now.  Denies any recent antibiotic exposure.  She takes Prozac 60 mg once daily for GAD.  Denies anhedonia, SI or HI currently.    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   Dysplasia of cervix    Family history of BRCA1 gene positive    Family history of breast cancer    Gestational diabetes    History of cesarean delivery 10/09/2016   X2 plans rpeat   Kidney stone 2011   Menorrhagia    Migraines    since age 20 per patient   Patient desires pregnancy 06/01/2013   PMDD (premenstrual dysphoric disorder)    PROZAC 20 MG BUT NONE WITH PREGNANCY   PONV (postoperative nausea and vomiting)    Vaginal Pap smear, abnormal     Past Surgical History:  Procedure Laterality Date   BICEPT TENODESIS Right 11/09/2021   Procedure: BICEPS TENODESIS;  Surgeon: Mordecai Rasmussen, MD;  Location: [facility];  Service: Orthopedics;  Laterality: Right;   BIOPSY N/A 05/04/2021    Procedure: BIOPSY;  Surgeon: Rogene Houston, MD;  [facility];  Service: Endoscopy;  Laterality: N/A;   CESAREAN SECTION  2010   CESAREAN SECTION N/A 03/29/2014   Procedure: REPEAT CESAREAN SECTION;  Surgeon: Florian Buff, MD;  Location: [place] [facility];  Service: Obstetrics;  Laterality: N/A;   CESAREAN SECTION WITH BILATERAL TUBAL LIGATION Bilateral 05/08/2017   Procedure: CESAREAN SECTION WITH BILATERAL TUBAL LIGATION;  Surgeon: Jonnie Kind, MD;  Location: [place];  Service: Obstetrics;  Laterality: Bilateral;   COLONOSCOPY N/A 05/04/2021   Procedure: COLONOSCOPY;  Surgeon: Rogene Houston, MD;  [facility];  Service: Endoscopy;  Laterality: N/A;  1245   HYSTERECTOMY ABDOMINAL WITH SALPINGECTOMY N/A 09/15/2019   Procedure: HYSTERECTOMY ABDOMINAL and OPEN BILATERAL SALPINGECTOMY;  Surgeon: Jonnie Kind, MD;  Location: [facility];  Service: Gynecology;  Laterality: N/A;   KIDNEY STONE SURGERY  2010   stent   LEEP  2008   phylloid removed  2009   left breast   SHOULDER ARTHROSCOPY WITH DEBRIDEMENT AND BICEP TENDON REPAIR Right 11/09/2021   Procedure: SHOULDER ARTHROSCOPY WITH DEBRIDEMENT OF CALCIFIC TENDONITIS AND BICEP TENDON REPAIR;  Surgeon: Mordecai Rasmussen, MD;  Location: [facility];  Service: Orthopedics;  Laterality: Right;   TONSILLECTOMY     WISDOM TOOTH  EXTRACTION      Family History  Problem Relation Age of Onset   Diabetes Maternal Grandmother    Breast cancer Maternal Grandmother        dx 40s-50   Diabetes Paternal Grandmother    Breast cancer Paternal Grandmother 67   COPD Paternal Grandfather    Heart disease Maternal Grandfather    Breast cancer Mother 69   BRCA 1/2 Mother        BRCA1 pos   Ovarian cancer Mother    Other Mother        peritoneal cancer   Colon polyps Maternal Uncle    SIDS Maternal Aunt        2 babies died of SIDS at 8 months    Social History   Socioeconomic History   Marital status: Married    Spouse name: Not  on file   Number of children: 3   Years of education: Not on file   Highest education level: Not on file  Occupational History   Not on file  Tobacco Use   Smoking status: Never   Smokeless tobacco: Never  Vaping Use   Vaping Use: Never used  Substance and Sexual Activity   Alcohol use: Yes    Alcohol/week: 7.0 standard drinks of alcohol    Types: 7 Glasses of wine per week   Drug use: No   Sexual activity: Not Currently    Birth control/protection: Surgical    Comment: tubal,hysterectomy  Other Topics Concern   Not on file  Social History Narrative   Married for 6 years.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   Social Determinants of Health   Financial Resource Strain: Not on file  Food Insecurity: Not on file  Transportation Needs: Not on file  Physical Activity: Not on file  Stress: Not on file  Social Connections: Not on file  Intimate Partner Violence: Not on file    Outpatient Medications Prior to Visit  Medication Sig Dispense Refill   acidophilus (RISAQUAD) CAPS capsule Take 1 capsule by mouth daily. (Patient not taking: Reported on 07/30/2022)     FLUoxetine (PROZAC) 20 MG tablet Take 1 tablet by mouth once daily 30 tablet 11   FLUoxetine (PROZAC) 40 MG capsule Take 1 capsule by mouth once daily 30 capsule 11   hydrocortisone 2.5 % cream Apply 1 Application topically 2 (two) times daily as needed.     hyoscyamine (LEVSIN) 0.125 MG tablet Take 1 tablet (0.125 mg total) by mouth every 8 (eight) hours as needed. 30 tablet 0   SUMAtriptan (IMITREX) 100 MG tablet Take 100 mg by mouth every 2 (two) hours as needed for migraine. May repeat in 2 hours if headache persists or recurs.     tretinoin (RETIN-A) 0.025 % cream Apply topically at bedtime.     EMGALITY 120 MG/ML SOAJ INJECT 1 ML SUBCUTANEOUSLY  ONCE EVERY MONTH 1 mL 0   WEGOVY 2.4 MG/0.75ML SOAJ INJECT 2.4 MG SUBCUTANEOUSLY ONCE A WEEK 4 mL 0   No facility-administered medications  prior to visit.    No Known Allergies  ROS Review of Systems  Constitutional:  Negative for chills and fever.  HENT:  Negative for congestion, sinus pressure, sinus pain and sore throat.   Eyes:  Negative for pain and discharge.  Respiratory:  Negative for cough and shortness of breath.   Cardiovascular:  Negative for chest pain and palpitations.  Gastrointestinal:  Positive for abdominal pain and diarrhea. Negative for  nausea and vomiting.  Endocrine: Negative for polydipsia and polyuria.  Genitourinary:  Negative for dysuria and hematuria.  Musculoskeletal:  Negative for neck pain and neck stiffness.  Skin:  Negative for rash.  Neurological:  Positive for tremors. Negative for dizziness and weakness.  Psychiatric/Behavioral:  Negative for agitation and behavioral problems.       Objective:    Physical Exam Vitals reviewed.  Constitutional:      General: She is not in acute distress.    Appearance: She is obese. She is not diaphoretic.  HENT:     Head: Normocephalic and atraumatic.     Nose: Nose normal.     Mouth/Throat:     Mouth: Mucous membranes are moist.  Eyes:     General: No scleral icterus.    Extraocular Movements: Extraocular movements intact.  Cardiovascular:     Rate and Rhythm: Normal rate and regular rhythm.     Pulses: Normal pulses.     Heart sounds: Normal heart sounds. No murmur heard. Pulmonary:     Breath sounds: Normal breath sounds. No wheezing or rales.  Abdominal:     Palpations: Abdomen is soft.     Tenderness: There is no abdominal tenderness.  Musculoskeletal:     Cervical back: Neck supple. No tenderness.     Right lower leg: No edema.     Left lower leg: No edema.  Skin:    General: Skin is warm.     Findings: No rash.  Neurological:     General: No focal deficit present.     Mental Status: She is alert and oriented to person, place, and time.     Cranial Nerves: No cranial nerve deficit.     Sensory: No sensory deficit.      Motor: No weakness.  Psychiatric:        Mood and Affect: Mood normal.        Behavior: Behavior normal.     BP 107/67 (BP Location: Right Arm, Patient Position: Sitting, Cuff Size: Normal)   Pulse 80   Ht 5\' 6"  (1.676 m)   Wt 168 lb (76.2 kg)   LMP 07/14/2019 (Approximate)   SpO2 98%   BMI 27.12 kg/m  Wt Readings from Last 3 Encounters:  07/30/22 168 lb (76.2 kg)  07/30/22 171 lb 3.2 oz (77.7 kg)  07/03/22 174 lb 11.2 oz (79.2 kg)    Lab Results  Component Value Date   TSH 1.990 07/27/2022   Lab Results  Component Value Date   WBC 6.0 07/27/2022   HGB 12.5 07/27/2022   HCT 37.3 07/27/2022   MCV 95 07/27/2022   PLT 276 07/27/2022   Lab Results  Component Value Date   NA 137 07/27/2022   K 4.3 07/27/2022   CO2 23 07/27/2022   GLUCOSE 101 (H) 07/27/2022   BUN 9 07/27/2022   CREATININE 0.72 07/27/2022   BILITOT 0.3 07/27/2022   ALKPHOS 66 07/27/2022   AST 15 07/27/2022   ALT 11 07/27/2022   PROT 7.0 07/27/2022   ALBUMIN 4.2 07/27/2022   CALCIUM 9.5 07/27/2022   ANIONGAP 6 11/06/2021   EGFR 110 07/27/2022   Lab Results  Component Value Date   CHOL 175 07/27/2022   Lab Results  Component Value Date   HDL 62 07/27/2022   Lab Results  Component Value Date   LDLCALC 90 07/27/2022   Lab Results  Component Value Date   TRIG 129 07/27/2022   Lab Results  Component Value Date  CHOLHDL 2.8 07/27/2022   Lab Results  Component Value Date   HGBA1C 4.8 07/27/2022      Assessment & Plan:   Problem List Items Addressed This Visit       Cardiovascular and Mediastinum   Migraine    Well controlled with Emgality and as needed Imitrex Does not have a neurologist currently  From a thorough review of records, medications tried that can be used in migraine management include Tylenol, Fioricet, Decadron, Benadryl, Emgality, Prozac, gabapentin, ibuprofen, ketorolac injections and tablets, Reglan, Zofran, oxycodone, Phenergan, scopolamine patch, Imitrex  tablet 100 mg, amitriptyline and nortriptyline contraindicated because she is already on an SSRI, Topamax contraindicated due to history of kidney stones      Relevant Medications   EMGALITY 120 MG/ML SOAJ     Digestive   Rectal bleeding    Intermittent episodes of BRBPR Has hemorrhoids, uses Anusol cream as needed Followed by GI        Endocrine   Hypothyroidism    Lab Results  Component Value Date   TSH 1.990 07/27/2022  Chart review does not show any history of elevated TSH or low free T4 Was on NP thyroid 60 mg daily, appeared to be oversupplemented, tapered NP thyroid TSH and free T4 WNL now        Other   Obesity (BMI 30.0-34.9)    BMI Readings from Last 3 Encounters:  07/30/22 27.12 kg/m  07/30/22 27.63 kg/m  07/03/22 28.20 kg/m   Continue to follow low-carb diet and perform moderate exercise/walking as tolerated Had good response with GLP-1 agonist in the past On Wegovy - initial BMI - 33.93 - has had excellent response with CMP and lipid profile as well, but she is going to lose coverage - strict low carb diet advised      Relevant Medications   Semaglutide-Weight Management (WEGOVY) 2.4 MG/0.75ML SOAJ   Mixed hyperlipidemia    Last lipid profile reviewed - improved with weight loss Continue to follow low-carb diet      Encounter for general adult medical examination with abnormal findings - Primary    Physical exam as documented. Counseling done  re healthy lifestyle involving commitment to 150 minutes exercise per week, heart healthy diet, and attaining healthy weight.The importance of adequate sleep also discussed. Changes in health habits are decided on by the patient with goals and time frames  set for achieving them. Immunization and cancer screening needs are specifically addressed at this visit.      Diarrhea    Chronic, watery to loose BM - IBS-D? Followed by GI On Levsin now Planned to get repeat colonoscopy        Meds ordered this  encounter  Medications   EMGALITY 120 MG/ML SOAJ    Sig: INJECT 1 ML SUBCUTANEOUSLY  ONCE EVERY MONTH    Dispense:  1 mL    Refill:  5   Semaglutide-Weight Management (WEGOVY) 2.4 MG/0.75ML SOAJ    Sig: Inject 2.4 mg into the skin every 7 (seven) days.    Dispense:  3 mL    Refill:  0    Follow-up: Return in about 4 months (around 11/29/2022) for Migraine and weight management.    Lindell Spar, MD

## 2022-07-30 NOTE — Assessment & Plan Note (Signed)
Well controlled with Emgality and as needed Imitrex Does not have a neurologist currently  From a thorough review of records, medications tried that can be used in migraine management include Tylenol, Fioricet, Decadron, Benadryl, Emgality, Prozac, gabapentin, ibuprofen, ketorolac injections and tablets, Reglan, Zofran, oxycodone, Phenergan, scopolamine patch, Imitrex tablet 100 mg, amitriptyline and nortriptyline contraindicated because she is already on an SSRI, Topamax contraindicated due to history of kidney stones 

## 2022-08-02 ENCOUNTER — Encounter (HOSPITAL_COMMUNITY)
Admission: RE | Admit: 2022-08-02 | Discharge: 2022-08-02 | Disposition: A | Discharge status: Home / Self Care | Payer: BC Managed Care – PPO | Source: Ambulatory Visit | Attending: Gastroenterology | Admitting: Gastroenterology

## 2022-08-02 ENCOUNTER — Encounter (HOSPITAL_COMMUNITY): Payer: Self-pay

## 2022-08-02 ENCOUNTER — Other Ambulatory Visit: Payer: Self-pay

## 2022-08-02 HISTORY — DX: Personal history of urinary calculi: Z87.442

## 2022-08-02 MED ORDER — PHENYLEPHRINE HCL-NACL 20-0.9 MG/250ML-% IV SOLN
INTRAVENOUS | Status: AC
  Filled 2022-08-02: qty 250

## 2022-08-02 NOTE — Pre-Procedure Instructions (Signed)
Attempted pre-op phonecall. Left VM for her to call us back. 

## 2022-08-07 ENCOUNTER — Ambulatory Visit (HOSPITAL_COMMUNITY): Payer: BC Managed Care – PPO | Admitting: Certified Registered"

## 2022-08-07 ENCOUNTER — Encounter (HOSPITAL_COMMUNITY): Payer: Self-pay | Admitting: Gastroenterology

## 2022-08-07 ENCOUNTER — Ambulatory Visit (HOSPITAL_COMMUNITY)
Admission: RE | Admit: 2022-08-07 | Discharge: 2022-08-07 | Disposition: A | Discharge status: Home / Self Care | Payer: BC Managed Care – PPO | Source: Ambulatory Visit | Attending: Gastroenterology | Admitting: Gastroenterology

## 2022-08-07 ENCOUNTER — Encounter (INDEPENDENT_AMBULATORY_CARE_PROVIDER_SITE_OTHER): Payer: Self-pay | Admitting: *Deleted

## 2022-08-07 ENCOUNTER — Encounter (HOSPITAL_COMMUNITY)
Admission: RE | Disposition: A | Discharge status: Home / Self Care | Payer: Self-pay | Source: Ambulatory Visit | Attending: Gastroenterology

## 2022-08-07 DIAGNOSIS — K648 Other hemorrhoids: Secondary | ICD-10-CM | POA: Diagnosis not present

## 2022-08-07 DIAGNOSIS — F3281 Premenstrual dysphoric disorder: Secondary | ICD-10-CM | POA: Insufficient documentation

## 2022-08-07 DIAGNOSIS — K529 Noninfective gastroenteritis and colitis, unspecified: Secondary | ICD-10-CM | POA: Insufficient documentation

## 2022-08-07 DIAGNOSIS — K602 Anal fissure, unspecified: Secondary | ICD-10-CM | POA: Diagnosis not present

## 2022-08-07 DIAGNOSIS — Z79899 Other long term (current) drug therapy: Secondary | ICD-10-CM | POA: Diagnosis not present

## 2022-08-07 DIAGNOSIS — K625 Hemorrhage of anus and rectum: Secondary | ICD-10-CM | POA: Diagnosis not present

## 2022-08-07 DIAGNOSIS — D123 Benign neoplasm of transverse colon: Secondary | ICD-10-CM | POA: Insufficient documentation

## 2022-08-07 DIAGNOSIS — D122 Benign neoplasm of ascending colon: Secondary | ICD-10-CM | POA: Diagnosis not present

## 2022-08-07 HISTORY — PX: BIOPSY: SHX5522

## 2022-08-07 HISTORY — PX: POLYPECTOMY: SHX149

## 2022-08-07 HISTORY — PX: COLONOSCOPY WITH PROPOFOL: SHX5780

## 2022-08-07 LAB — HM COLONOSCOPY

## 2022-08-07 SURGERY — COLONOSCOPY WITH PROPOFOL
Anesthesia: General

## 2022-08-07 MED ORDER — MIDAZOLAM HCL 2 MG/2ML IJ SOLN
INTRAMUSCULAR | Status: DC | PRN
  Administered 2022-08-07: 2 mg via INTRAVENOUS

## 2022-08-07 MED ORDER — MIDAZOLAM HCL 2 MG/2ML IJ SOLN
INTRAMUSCULAR | Status: AC
  Filled 2022-08-07: qty 2

## 2022-08-07 MED ORDER — LACTATED RINGERS IV SOLN: INTRAVENOUS | Status: DC

## 2022-08-07 MED ORDER — DEXMEDETOMIDINE HCL IN NACL 80 MCG/20ML IV SOLN
INTRAVENOUS | Status: DC | PRN
  Administered 2022-08-07 (×2): 8 ug via BUCCAL

## 2022-08-07 MED ORDER — LIDOCAINE HCL (CARDIAC) PF 100 MG/5ML IV SOSY
PREFILLED_SYRINGE | INTRAVENOUS | Status: DC | PRN
  Administered 2022-08-07: 50 mg via INTRAVENOUS

## 2022-08-07 MED ORDER — PHENYLEPHRINE 80 MCG/ML (10ML) SYRINGE FOR IV PUSH (FOR BLOOD PRESSURE SUPPORT)
PREFILLED_SYRINGE | INTRAVENOUS | Status: DC | PRN
  Administered 2022-08-07 (×2): 160 ug via INTRAVENOUS

## 2022-08-07 MED ORDER — LACTATED RINGERS IV SOLN: INTRAVENOUS | Status: DC | PRN

## 2022-08-07 MED ORDER — PROPOFOL 500 MG/50ML IV EMUL
INTRAVENOUS | Status: DC | PRN
  Administered 2022-08-07: 150 ug/kg/min via INTRAVENOUS

## 2022-08-07 MED ORDER — PROPOFOL 10 MG/ML IV BOLUS
INTRAVENOUS | Status: DC | PRN
  Administered 2022-08-07: 50 mg via INTRAVENOUS
  Administered 2022-08-07: 100 mg via INTRAVENOUS
  Administered 2022-08-07: 50 mg via INTRAVENOUS

## 2022-08-07 NOTE — Anesthesia Procedure Notes (Signed)
Date/Time: 08/07/2022 8:27 AM  Performed by: Orlie Dakin, CRNAPre-anesthesia Checklist: Patient identified, Emergency Drugs available, Suction available and Patient being monitored Patient Re-evaluated:Patient Re-evaluated prior to induction Oxygen Delivery Method: Nasal cannula Induction Type: IV induction Placement Confirmation: positive ETCO2

## 2022-08-07 NOTE — Discharge Instructions (Signed)
You are being discharged to home.  Resume your previous diet.  We are waiting for your pathology results.  Your physician has recommended a repeat colonoscopy for surveillance based on pathology results.  Continue Levsin as needed for diarrhea. Can take IBGard as needed for abdominal cramping/bloating.

## 2022-08-07 NOTE — Interval H&P Note (Signed)
History and Physical Interval Note:  08/07/2022 7:35 AM  Julia Rangel  has presented today for surgery, with the diagnosis of diarrhea, RB.  The various methods of treatment have been discussed with the patient and family. After consideration of risks, benefits and other options for treatment, the patient has consented to  Procedure(s) with comments: COLONOSCOPY WITH PROPOFOL (N/A) - 830am, asa 2 as a surgical intervention.  The patient's history has been reviewed, patient examined, no change in status, stable for surgery.  I have reviewed the patient's chart and labs.  Questions were answered to the patient's satisfaction.     [name]

## 2022-08-07 NOTE — Op Note (Signed)
[facility] Patient Name: Julia Rangel Procedure Date: 08/07/2022 8:14 AM MRN: [id number] Date of Birth: [birth year] Attending MD: Maylon Peppers , , [id number] CSN: [id number] Age: 37 Admit Type: Outpatient Procedure:                Colonoscopy Indications:              Chronic diarrhea, Rectal bleeding Providers:                Maylon Peppers, Crystal Page, Casimer Bilis, Technician Referring MD:              Medicines:                Monitored Anesthesia Care Complications:            No immediate complications. Estimated Blood Loss:     Estimated blood loss: none. Procedure:                Pre-Anesthesia Assessment:                           - Prior to the procedure, a History and Physical                            was performed, and patient medications, allergies                            and sensitivities were reviewed. The patient's                            tolerance of previous anesthesia was reviewed.                           - The risks and benefits of the procedure and the                            sedation options and risks were discussed with the                            patient. All questions were answered and informed                            consent was obtained.                           - ASA Grade Assessment: I - A normal, healthy                            patient.                           After obtaining informed consent, the colonoscope                            was passed under direct vision. Throughout the  procedure, the patient's blood pressure, pulse, and                            oxygen saturations were monitored continuously. The                            [id number]-HQ190L [place]:[id number]) scope was introduced through                            the anus and advanced to the the terminal ileum.                            The colonoscopy was performed without difficulty.                             The patient tolerated the procedure well. The                            quality of the bowel preparation was excellent. Scope In: 8:31:04 AM Scope Out: 8:53:49 AM Scope Withdrawal Time: 0 hours 15 minutes 25 seconds  Total Procedure Duration: 0 hours 22 minutes 45 seconds  Findings:      The perianal and digital rectal examinations were normal.      The terminal ileum appeared normal.      Two sessile polyps were found in the transverse colon and ascending       colon. The polyps were 3 to 4 mm in size. These polyps were removed with       a cold snare. Resection and retrieval were complete.      The rest of the colon appeared normal. Biopsies for histology were taken       with a cold forceps from the right colon and left colon for evaluation       of microscopic colitis.      Non-bleeding internal hemorrhoids were found during retroflexion. The       hemorrhoids were small. Impression:               - The examined portion of the ileum was normal.                           - Two 3 to 4 mm polyps in the transverse colon and                            in the ascending colon, removed with a cold snare.                            Resected and retrieved.                           - The rest of the examined colon is normal.                            Biopsied.                           - Non-bleeding  internal hemorrhoids. Moderate Sedation:      Per Anesthesia Care Recommendation:           - Discharge patient to home (ambulatory).                           - Resume previous diet.                           - Await pathology results.                           - Repeat colonoscopy for surveillance based on                            pathology results.                           - Continue Levsin as needed for diarrhea.                           - Can take IBGard as needed for abdominal                            cramping/bloating. Procedure Code(s):        --- Professional ---                            [phone], Colonoscopy, flexible; with removal of                            tumor(s), polyp(s), or other lesion(s) by snare                            technique                           45380, 60, Colonoscopy, flexible; with biopsy,                            single or multiple Diagnosis Code(s):        --- Professional ---                           K64.8, Other hemorrhoids                           D12.3, Benign neoplasm of transverse colon (hepatic                            flexure or splenic flexure)                           D12.2, Benign neoplasm of ascending colon                           K52.9, Noninfective gastroenteritis and colitis,  unspecified                           K62.5, Hemorrhage of anus and rectum CPT copyright 2022 American Medical Association. All rights reserved. The codes documented in this report are preliminary and upon coder review may  be revised to meet current compliance requirements. Maylon Peppers, MD Maylon Peppers,  08/07/2022 9:01:17 AM This report has been signed electronically. Number of Addenda: 0

## 2022-08-07 NOTE — Anesthesia Preprocedure Evaluation (Signed)
Anesthesia Evaluation  Patient identified by MRN, date of birth, ID band Patient awake    Reviewed: Allergy & Precautions, H&P , NPO status , Patient's Chart, lab work & pertinent test results, reviewed documented beta blocker date and time   History of Anesthesia Complications (+) PONV and history of anesthetic complications  Airway Mallampati: II  TM Distance: >3 FB Neck ROM: full    Dental no notable dental hx.    Pulmonary neg pulmonary ROS   Pulmonary exam normal breath sounds clear to auscultation       Cardiovascular Exercise Tolerance: Good negative cardio ROS  Rhythm:regular Rate:Normal     Neuro/Psych  Headaches PSYCHIATRIC DISORDERS  Depression    negative neurological ROS  negative psych ROS   GI/Hepatic negative GI ROS, Neg liver ROS,,,  Endo/Other  negative endocrine ROSHypothyroidism    Renal/GU negative Renal ROS  negative genitourinary   Musculoskeletal   Abdominal   Peds  Hematology negative hematology ROS (+) Blood dyscrasia, anemia   Anesthesia Other Findings   Reproductive/Obstetrics negative OB ROS                             Anesthesia Physical Anesthesia Plan  ASA: 2  Anesthesia Plan: General   Post-op Pain Management:    Induction:   PONV Risk Score and Plan: Propofol infusion  Airway Management Planned:   Additional Equipment:   Intra-op Plan:   Post-operative Plan:   Informed Consent: I have reviewed the patients History and Physical, chart, labs and discussed the procedure including the risks, benefits and alternatives for the proposed anesthesia with the patient or authorized representative who has indicated his/her understanding and acceptance.     Dental Advisory Given  Plan Discussed with: CRNA  Anesthesia Plan Comments:        Anesthesia Quick Evaluation

## 2022-08-07 NOTE — Transfer of Care (Signed)
Immediate Anesthesia Transfer of Care Note  Patient: Julia Rangel  Procedure(s) Performed: COLONOSCOPY WITH PROPOFOL POLYPECTOMY INTESTINAL BIOPSY  Patient Location: Short Stay  Anesthesia Type:General  Level of Consciousness: drowsy  Airway & Oxygen Therapy: Patient Spontanous Breathing  Post-op Assessment: Report given to RN and Post -op Vital signs reviewed and stable  Post vital signs: Reviewed and stable  Last Vitals:  Vitals Value Taken Time  BP    Temp    Pulse    Resp    SpO2      Last Pain:  Vitals:   08/07/22 0825  TempSrc:   PainSc: 0-No pain         Complications: No notable events documented.

## 2022-08-08 LAB — SURGICAL PATHOLOGY

## 2022-08-09 NOTE — Anesthesia Postprocedure Evaluation (Signed)
Anesthesia Post Note  Patient: Julia Rangel  Procedure(s) Performed: COLONOSCOPY WITH PROPOFOL POLYPECTOMY INTESTINAL BIOPSY  Patient location during evaluation: Phase II Anesthesia Type: General Level of consciousness: awake Pain management: pain level controlled Vital Signs Assessment: post-procedure vital signs reviewed and stable Respiratory status: spontaneous breathing and respiratory function stable Cardiovascular status: blood pressure returned to baseline and stable Postop Assessment: no headache and no apparent nausea or vomiting Anesthetic complications: no Comments: Late entry   No notable events documented.   Last Vitals:  Vitals:   08/07/22 0639 08/07/22 0859  BP: 107/69 111/65  Pulse: 79 75  Resp: 18 15  Temp: 36.9 C 36.9 C  SpO2: 100% 98%    Last Pain:  Vitals:   08/07/22 0859  TempSrc: Oral  PainSc: 0-No pain                 Louann Sjogren

## 2022-08-14 ENCOUNTER — Encounter (HOSPITAL_COMMUNITY): Payer: Self-pay | Admitting: Gastroenterology

## 2022-09-23 NOTE — Progress Notes (Deleted)
GI Office Note    Referring Provider: Anabel Halon, MD Primary Care Physician:  Anabel Halon, MD Primary Gastroenterologist: Dolores Frame, MD  Date:  09/23/2022  ID:  Julia Rangel, DOB [birth year], MRN [id number]   Chief Complaint   No chief complaint on file.  History of Present Illness  Julia Rangel is a 37 y.o. female with a history of *** presenting today with complaint of   Labs 07/10/22: celiac panel negative. Thyroid normal. CRP wnl. CBC and CMP wnl. Given ongoing diarrhea and rectal bleeding she was scheduled for colonoscopy.   Last office visit 07/30/22. ***  Colonoscopy 08/07/22: -2 polyps in the transverse and ascending -TI normal -colon normal s/p biopsies -non bleeding internal hemorrhoids -Continue Levsin, may use IBGard for cramping/bloating -polyps tubular adenomas -biopsies negative for microscopic colitis -Repeat colonoscopy in 7 years  Today:   Current Outpatient Medications  Medication Sig Dispense Refill   Bacillus Coagulans-Inulin (PROBIOTIC-PREBIOTIC PO) Take 2 tablets by mouth in the morning. Lemme Debloat - Digest[facility] Gut [facility] Gummies with Probiotics & Prebiotic     EMGALITY 120 MG/ML SOAJ INJECT 1 ML SUBCUTANEOUSLY  ONCE EVERY MONTH 1 mL 5   FLUoxetine (PROZAC) 20 MG tablet Take 1 tablet by mouth once daily (Patient taking differently: Take 20 mg by mouth at bedtime. 20 mg + 40 mg=60 mg daily) 30 tablet 11   FLUoxetine (PROZAC) 40 MG capsule Take 1 capsule by mouth once daily (Patient taking differently: Take 40 mg by mouth at bedtime. 40 mg + 20 mg=60 mg daily) 30 capsule 11   hydrocortisone 2.5 % cream Apply 1 Application topically 2 (two) times daily as needed (skin irritation.).     hyoscyamine (LEVSIN) 0.125 MG tablet Take 1 tablet (0.125 mg total) by mouth every 8 (eight) hours as needed. 30 tablet 0   MELATONIN-THEANINE PO Take 2 tablets by mouth at bedtime. Lemme Sleep Gummies with 5mg  Melatonin, Elderberry, Magnesium,  L-Theanine, Chamomile and Lavender     Semaglutide-Weight Management (WEGOVY) 2.4 MG/0.75ML SOAJ Inject 2.4 mg into the skin every 7 (seven) days. (Patient taking differently: Inject 2.4 mg into the skin every Monday.) 3 mL 0   SUMAtriptan (IMITREX) 100 MG tablet Take 100 mg by mouth every 2 (two) hours as needed for migraine. May repeat in 2 hours if headache persists or recurs.     tretinoin (RETIN-A) 0.025 % cream Apply 1 Application topically in the morning.     No current facility-administered medications for this visit.    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   Dysplasia of cervix    Family history of BRCA1 gene positive    Family history of breast cancer    History of cesarean delivery 10/09/2016   X2 plans rpeat   History of kidney stones    Menorrhagia    Migraines    since age 71 per patient   Patient desires pregnancy 06/01/2013   PMDD (premenstrual dysphoric disorder)    PROZAC 20 MG BUT NONE WITH PREGNANCY   PONV (postoperative nausea and vomiting)    Vaginal Pap smear, abnormal     Past Surgical History:  Procedure Laterality Date   BICEPT TENODESIS Right 11/09/2021   Procedure: BICEPS TENODESIS;  Surgeon: Oliver Barre, MD;  Location: [facility];  Service: Orthopedics;  Laterality: Right;   BIOPSY N/A 05/04/2021   Procedure: BIOPSY;  Surgeon: Malissa Hippo, MD;  Location: [facility];  Service: Endoscopy;  Laterality: N/A;   BIOPSY  08/07/2022   Procedure: BIOPSY;  Surgeon: Dolores Frame, MD;  Location: [facility];  Service: Gastroenterology;;   CESAREAN SECTION  2010   CESAREAN SECTION N/A 03/29/2014   Procedure: REPEAT CESAREAN SECTION;  Surgeon: Lazaro Arms, MD;  Location: [facility];  Service: Obstetrics;  Laterality: N/A;   CESAREAN SECTION WITH BILATERAL TUBAL LIGATION Bilateral 05/08/2017   Procedure: CESAREAN SECTION WITH BILATERAL TUBAL LIGATION;  Surgeon: Tilda Burrow, MD;  Location:  [facility];  Service: Obstetrics;  Laterality: Bilateral;   COLONOSCOPY N/A 05/04/2021   Procedure: COLONOSCOPY;  Surgeon: Malissa Hippo, MD;  Location: [facility];  Service: Endoscopy;  Laterality: N/A;  1245   COLONOSCOPY WITH PROPOFOL N/A 08/07/2022   Procedure: COLONOSCOPY WITH PROPOFOL;  Surgeon: Dolores Frame, MD;  Location: [facility];  Service: Gastroenterology;  Laterality: N/A;  830am, asa 2   HYSTERECTOMY ABDOMINAL WITH SALPINGECTOMY N/A 09/15/2019   Procedure: HYSTERECTOMY ABDOMINAL and OPEN BILATERAL SALPINGECTOMY;  Surgeon: Tilda Burrow, MD;  Location: [facility];  Service: Gynecology;  Laterality: N/A;   KIDNEY STONE SURGERY  2010   stent   LEEP  2008   phylloid removed  2009   left breast   POLYPECTOMY  08/07/2022   Procedure: POLYPECTOMY INTESTINAL;  Surgeon: Dolores Frame, MD;  Location: [facility];  Service: Gastroenterology;;   SHOULDER ARTHROSCOPY WITH DEBRIDEMENT AND BICEP TENDON REPAIR Right 11/09/2021   Procedure: SHOULDER ARTHROSCOPY WITH DEBRIDEMENT OF CALCIFIC TENDONITIS AND BICEP TENDON REPAIR;  Surgeon: Oliver Barre, MD;  Location: [facility];  Service: Orthopedics;  Laterality: Right;   TONSILLECTOMY     WISDOM TOOTH EXTRACTION      Family History  Problem Relation Age of Onset   Diabetes Maternal Grandmother    Breast cancer Maternal Grandmother        dx 40s-50   Diabetes Paternal Grandmother    Breast cancer Paternal Grandmother 53   COPD Paternal Grandfather    Heart disease Maternal Grandfather    Breast cancer Mother 27   BRCA 1/2 Mother        BRCA1 pos   Ovarian cancer Mother    Other Mother        peritoneal cancer   Colon polyps Maternal Uncle    SIDS Maternal Aunt        2 babies died of SIDS at 4 months    Allergies as of 09/24/2022   (No Known Allergies)    Social History   Socioeconomic History   Marital status: Married    Spouse name: Not on file   Number of children: 3   Years of  education: Not on file   Highest education level: Not on file  Occupational History   Not on file  Tobacco Use   Smoking status: Never   Smokeless tobacco: Never  Vaping Use   Vaping Use: Never used  Substance and Sexual Activity   Alcohol use: Yes    Alcohol/week: 7.0 standard drinks of alcohol    Types: 7 Glasses of wine per week   Drug use: No   Sexual activity: Not Currently    Birth control/protection: Surgical    Comment: tubal,hysterectomy  Other Topics Concern   Not on file  Social History Narrative   Married for 6 years.Lives with husband and 3 kids.Teacher at [employer].   Social Determinants of Health  Financial Resource Strain: Not on file  Food Insecurity: Not on file  Transportation Needs: Not on file  Physical Activity: Not on file  Stress: Not on file  Social Connections: Not on file     Review of Systems   Gen: Denies fever, chills, anorexia. Denies fatigue, weakness, weight loss.  CV: Denies chest pain, palpitations, syncope, peripheral edema, and claudication. Resp: Denies dyspnea at rest, cough, wheezing, coughing up blood, and pleurisy. GI: See HPI Derm: Denies rash, itching, dry skin Psych: Denies depression, anxiety, memory loss, confusion. No homicidal or suicidal ideation.  Heme: Denies bruising, bleeding, and enlarged lymph nodes.   Physical Exam   LMP 07/14/2019 (Approximate)   General:   Alert and oriented. No distress noted. Pleasant and cooperative.  Head:  Normocephalic and atraumatic. Eyes:  Conjuctiva clear without scleral icterus. Mouth:  Oral mucosa pink and moist. Good dentition. No lesions. Lungs:  Clear to auscultation bilaterally. No wheezes, rales, or rhonchi. No distress.  Heart:  S1, S2 present without murmurs appreciated.  Abdomen:  +BS, soft, non-tender and non-distended. No rebound or guarding. No HSM or masses noted. Rectal: *** Msk:  Symmetrical without gross deformities. Normal  posture. Extremities:  Without edema. Neurologic:  Alert and  oriented x4 Psych:  Alert and cooperative. Normal mood and affect.   Assessment  Julia Rangel is a 38 y.o. female with a history of *** presenting today with   Rectal bleeding, fissure, hemorrhoids:   IBS - diarrhea:   PLAN   *** IBGard? Levsin? Viberzi? - gallbladder?    Brooke Bonito, MSN, FNP-BC, AGACNP-BC [facility]

## 2022-09-24 ENCOUNTER — Encounter: Payer: Self-pay | Admitting: Gastroenterology

## 2022-09-24 ENCOUNTER — Ambulatory Visit: Payer: BC Managed Care – PPO | Admitting: Gastroenterology

## 2022-10-03 ENCOUNTER — Other Ambulatory Visit: Payer: Self-pay | Admitting: Internal Medicine

## 2022-10-03 DIAGNOSIS — E669 Obesity, unspecified: Secondary | ICD-10-CM

## 2022-11-02 ENCOUNTER — Other Ambulatory Visit: Payer: Self-pay

## 2022-11-02 ENCOUNTER — Encounter (HOSPITAL_COMMUNITY): Payer: Self-pay | Admitting: Emergency Medicine

## 2022-11-02 ENCOUNTER — Emergency Department (HOSPITAL_COMMUNITY): Payer: BC Managed Care – PPO

## 2022-11-02 ENCOUNTER — Emergency Department (HOSPITAL_COMMUNITY)
Admission: EM | Admit: 2022-11-02 | Discharge: 2022-11-02 | Disposition: A | Discharge status: Home / Self Care | Payer: BC Managed Care – PPO | Attending: Emergency Medicine | Admitting: Emergency Medicine

## 2022-11-02 DIAGNOSIS — K529 Noninfective gastroenteritis and colitis, unspecified: Secondary | ICD-10-CM | POA: Insufficient documentation

## 2022-11-02 DIAGNOSIS — R1030 Lower abdominal pain, unspecified: Secondary | ICD-10-CM | POA: Diagnosis present

## 2022-11-02 LAB — COMPREHENSIVE METABOLIC PANEL
ALT: 17 U/L (ref 0–44)
AST: 18 U/L (ref 15–41)
Albumin: 4.4 g/dL (ref 3.5–5.0)
Alkaline Phosphatase: 53 U/L (ref 38–126)
Anion gap: 11 (ref 5–15)
BUN: 21 mg/dL — ABNORMAL HIGH (ref 6–20)
CO2: 22 mmol/L (ref 22–32)
Calcium: 9.5 mg/dL (ref 8.9–10.3)
Chloride: 102 mmol/L (ref 98–111)
Creatinine, Ser: 0.99 mg/dL (ref 0.44–1.00)
GFR, Estimated: >60 mL/min (ref >60)
Glucose, Bld: 99 mg/dL (ref 70–99)
Potassium: 3.8 mmol/L (ref 3.5–5.1)
Sodium: 135 mmol/L (ref 135–145)
Total Bilirubin: 0.6 mg/dL (ref 0.3–1.2)
Total Protein: 7.9 g/dL (ref 6.5–8.1)

## 2022-11-02 LAB — CBC
HCT: 38.1 % (ref 36.0–46.0)
Hemoglobin: 13.2 g/dL (ref 12.0–15.0)
MCH: 32.3 pg (ref 26.0–34.0)
MCHC: 34.6 g/dL (ref 30.0–36.0)
MCV: 93.2 fL (ref 80.0–100.0)
Platelets: 279 10*3/uL (ref 150–400)
RBC: 4.09 MIL/uL (ref 3.87–5.11)
RDW: 11.4 % — ABNORMAL LOW (ref 11.5–15.5)
WBC: 6.4 10*3/uL (ref 4.0–10.5)
nRBC: 0 % (ref 0.0–0.2)

## 2022-11-02 LAB — URINALYSIS, ROUTINE W REFLEX MICROSCOPIC
Bilirubin Urine: NEGATIVE
Glucose, UA: NEGATIVE mg/dL
Hgb urine dipstick: NEGATIVE
Ketones, ur: 20 mg/dL — AB
Leukocytes,Ua: NEGATIVE
Nitrite: NEGATIVE
Protein, ur: NEGATIVE mg/dL
Specific Gravity, Urine: 1.027 (ref 1.005–1.030)
pH: 5 (ref 5.0–8.0)

## 2022-11-02 LAB — LIPASE, BLOOD: Lipase: 38 U/L (ref 11–51)

## 2022-11-02 LAB — PREGNANCY, URINE: Preg Test, Ur: NEGATIVE

## 2022-11-02 MED ORDER — MORPHINE SULFATE (PF) 4 MG/ML IV SOLN
4.0000 mg | Freq: Once | INTRAVENOUS | Status: AC
  Administered 2022-11-02: 4 mg via INTRAVENOUS
  Filled 2022-11-02: qty 1

## 2022-11-02 MED ORDER — SODIUM CHLORIDE 0.9 % IV BOLUS
500.0000 mL | Freq: Once | INTRAVENOUS | Status: AC
  Administered 2022-11-02: 500 mL via INTRAVENOUS

## 2022-11-02 MED ORDER — AMOXICILLIN-POT CLAVULANATE 875-125 MG PO TABS
1.0000 | ORAL_TABLET | Freq: Once | ORAL | Status: AC
  Administered 2022-11-02: 1 via ORAL
  Filled 2022-11-02: qty 1

## 2022-11-02 MED ORDER — IOHEXOL 300 MG/ML  SOLN
100.0000 mL | Freq: Once | INTRAMUSCULAR | Status: AC | PRN
  Administered 2022-11-02: 100 mL via INTRAVENOUS

## 2022-11-02 MED ORDER — HYDROCODONE-ACETAMINOPHEN 5-325 MG PO TABS: 1.0000 | ORAL_TABLET | Freq: Four times a day (QID) | ORAL | 0 refills | Status: DC | PRN

## 2022-11-02 MED ORDER — AMOXICILLIN-POT CLAVULANATE 875-125 MG PO TABS: 1.0000 | ORAL_TABLET | Freq: Two times a day (BID) | ORAL | 0 refills | Status: DC

## 2022-11-02 MED ORDER — ONDANSETRON HCL 4 MG/2ML IJ SOLN
4.0000 mg | Freq: Once | INTRAMUSCULAR | Status: AC
  Administered 2022-11-02: 4 mg via INTRAVENOUS
  Filled 2022-11-02: qty 2

## 2022-11-02 NOTE — Discharge Instructions (Addendum)
Please pick up data biotics I have prescribed for you.  Today your CT shows you have colitis which most likely explains her symptoms.  Please follow-up with your GI specialist to be seen as symptoms may change.  I have also prescribed you Norco which are strong pain meds to be taken if Tylenol does not alleviate pain.  If symptoms change or worsen please return to ER.

## 2022-11-02 NOTE — ED Notes (Signed)
Patient transported to CT in NAD.

## 2022-11-02 NOTE — ED Triage Notes (Addendum)
Pt via POV c/o crampy lower abdominal pain 10/10 since about 3 days ago. Pt reports nausea despite zofran. Denies vomiting, diarrhea, constipation. No specific tender area but pt notes she has felt radiation to lower back also. GB and appendix are intact. Last PO intake was a milkshake around noon today.

## 2022-11-02 NOTE — ED Provider Notes (Signed)
[place] EMERGENCY DEPARTMENT AT [facility] Provider Note   CSN: [id number] Arrival date & time: 11/02/22  1630     History  Chief Complaint  Patient presents with   Abdominal Pain    Julia Rangel is a 37 y.o. female status post hysterectomy, C-section, history of severe dysplasia of cervix presented with lower abdominal pain that began 3 days ago.  Patient recently saw GI and had reassuring scopes back in April 2024 and states that this pain is much worse.  Patient states that she still has her gallbladder and appendix.  Patient states that the pain is in the lower abdomen however does go throughout her entire abdomen.  Patient states she continues to feel nauseous despite taking 8 mg Zofran yesterday.  Patient that she has still been able to eat and drink however very minimally due to her nausea.    Patient denies chest pain, shortness of breath, change in sensation/motor skills, emesis, dysuria, hematuria, algesia, constipation, diarrhea, history of AAA   Home Medications Prior to Admission medications   Medication Sig Start Date End Date Taking? Authorizing Provider  amoxicillin-clavulanate (AUGMENTIN) 875-125 MG tablet Take 1 tablet by mouth every 12 (twelve) hours. 11/02/22  Yes Chele Cornell, Beverly Gust, PA-C  HYDROcodone-acetaminophen (NORCO) 5-325 MG tablet Take 1 tablet by mouth every 6 (six) hours as needed for up to 3 days for moderate pain. 11/02/22 11/05/22 Yes Lakiya Cottam, Beverly Gust, PA-C  Bacillus Coagulans-Inulin (PROBIOTIC-PREBIOTIC PO) Take 2 tablets by mouth in the morning. Lemme Debloat - Digestive & Gut Health Gummies with Probiotics & Prebiotic    [provider]  EMGALITY 120 MG/ML SOAJ INJECT 1 ML SUBCUTANEOUSLY  ONCE EVERY MONTH 07/30/22   Anabel Halon, MD  FLUoxetine (PROZAC) 20 MG tablet Take 1 tablet by mouth once daily Patient taking differently: Take 20 mg by mouth at bedtime. 20 mg + 40 mg=60 mg daily 05/24/22   Cresenzo-Dishmon, Scarlette Calico, CNM   FLUoxetine (PROZAC) 40 MG capsule Take 1 capsule by mouth once daily Patient taking differently: Take 40 mg by mouth at bedtime. 40 mg + 20 mg=60 mg daily 12/18/21   Lazaro Arms, MD  hydrocortisone 2.5 % cream Apply 1 Application topically 2 (two) times daily as needed (skin irritation.). 07/19/22   [provider]  hyoscyamine (LEVSIN) 0.125 MG tablet Take 1 tablet (0.125 mg total) by mouth every 8 (eight) hours as needed. 07/30/22   Aida Raider, NP  MELATONIN-THEANINE PO Take 2 tablets by mouth at bedtime. Lemme Sleep Gummies with 5mg  Melatonin, Elderberry, Magnesium, L-Theanine, Chamomile and Lavender    [provider]  SUMAtriptan (IMITREX) 100 MG tablet Take 100 mg by mouth every 2 (two) hours as needed for migraine. May repeat in 2 hours if headache persists or recurs.    [provider]  tretinoin (RETIN-A) 0.025 % cream Apply 1 Application topically in the morning. 05/02/22   [provider]  WEGOVY 2.4 MG/0.75ML SOAJ INJECT 2.4 MG SUBCUTANEOUSLY ONCE A WEEK 10/03/22   Anabel Halon, MD      Allergies    Patient has no known allergies.    Review of Systems   Review of Systems  Gastrointestinal:  Positive for abdominal pain.    Physical Exam Updated Vital Signs BP 109/80 (BP Location: Right Arm)   Pulse 84   Temp 97.9 F (36.6 C) (Oral)   Resp 18   Ht 5\' 6"  (1.676 m)   Wt 74.8 kg   LMP 07/14/2019 (  Approximate)   SpO2 100%   BMI 26.63 kg/m  Physical Exam Vitals reviewed.  Constitutional:      General: She is not in acute distress. HENT:     Head: Normocephalic and atraumatic.  Eyes:     Extraocular Movements: Extraocular movements intact.     Conjunctiva/sclera: Conjunctivae normal.     Pupils: Pupils are equal, round, and reactive to light.  Cardiovascular:     Rate and Rhythm: Normal rate and regular rhythm.     Pulses: Normal pulses.     Heart sounds: Normal heart sounds.     Comments: 2+ bilateral radial/dorsalis  pedis pulses with regular rate Pulmonary:     Effort: Pulmonary effort is normal. No respiratory distress.     Breath sounds: Normal breath sounds.  Abdominal:     Palpations: Abdomen is soft.     Tenderness: There is abdominal tenderness (Epigastric/right upper quadrant). There is no guarding or rebound.  Musculoskeletal:        General: Normal range of motion.     Cervical back: Normal range of motion and neck supple.     Comments: 5 out of 5 bilateral grip/leg extension strength  Skin:    General: Skin is warm and dry.     Capillary Refill: Capillary refill takes less than 2 seconds.  Neurological:     General: No focal deficit present.     Mental Status: She is alert and oriented to person, place, and time.     Comments: Sensation intact in all 4 limbs  Psychiatric:        Mood and Affect: Mood normal.     ED Results / Procedures / Treatments   Labs (all labs ordered are listed, but only abnormal results are displayed) Labs Reviewed  COMPREHENSIVE METABOLIC PANEL - Abnormal; Notable for the following components:      Result Value   BUN 21 (*)    All other components within normal limits  CBC - Abnormal; Notable for the following components:   RDW 11.4 (*)    All other components within normal limits  URINALYSIS, ROUTINE W REFLEX MICROSCOPIC - Abnormal; Notable for the following components:   Ketones, ur 20 (*)    All other components within normal limits  LIPASE, BLOOD  PREGNANCY, URINE    EKG None  Radiology CT ABDOMEN PELVIS W CONTRAST  Result Date: 11/02/2022 CLINICAL DATA:  Acute abdominal pain for several days, initial encounter EXAM: CT ABDOMEN AND PELVIS WITH CONTRAST TECHNIQUE: Multidetector CT imaging of the abdomen and pelvis was performed using the standard protocol following bolus administration of intravenous contrast. RADIATION DOSE REDUCTION: This exam was performed according to the departmental dose-optimization program which includes automated  exposure control, adjustment of the mA and/or kV according to patient size and/or use of iterative reconstruction technique. CONTRAST:  OMNIPAQUE IOHEXOL 300 MG/ML  SOLN COMPARISON:  10/13/2019 FINDINGS: Lower chest: No acute abnormality. Hepatobiliary: No focal liver abnormality is seen. No gallstones, gallbladder wall thickening, or biliary dilatation. Pancreas: Unremarkable. No pancreatic ductal dilatation or surrounding inflammatory changes. Spleen: Normal in size without focal abnormality. Adrenals/Urinary Tract: Adrenal glands are within normal limits. Kidneys demonstrate a normal enhancement pattern bilaterally. No renal calculi or obstructive changes are seen. The bladder is decompressed. Stomach/Bowel: Fluid is noted throughout the colon. Some mild wall thickening and pericolonic inflammatory change is noted particularly in the descending colon consistent with colitis. Stomach and small bowel are within normal limits. The appendix is unremarkable. Vascular/Lymphatic: No  significant vascular findings are present. No enlarged abdominal or pelvic lymph nodes. Reproductive: Status post hysterectomy. No adnexal masses. Other: No abdominal wall hernia or abnormality. No abdominopelvic ascites. Musculoskeletal: No acute or significant osseous findings. IMPRESSION: Changes consistent with colitis most prominent in the mid descending colon. No perforation or abscess formation is noted. No other is noted Electronically Signed   By: Alcide Clever M.D.   On: 11/02/2022 22:31    Procedures Procedures    Medications Ordered in ED Medications  amoxicillin-clavulanate (AUGMENTIN) 875-125 MG per tablet 1 tablet (has no administration in time range)  morphine (PF) 4 MG/ML injection 4 mg (4 mg Intravenous Given 11/02/22 2120)  ondansetron (ZOFRAN) injection 4 mg (4 mg Intravenous Given 11/02/22 2122)  sodium chloride 0.9 % bolus 500 mL (0 mLs Intravenous Stopped 11/02/22 2247)  iohexol (OMNIPAQUE) 300 MG/ML  solution 100 mL (100 mLs Intravenous Contrast Given 11/02/22 2209)    ED Course/ Medical Decision Making/ A&P                             Medical Decision Making Amount and/or Complexity of Data Reviewed Labs: ordered. Radiology: ordered.  Risk Prescription drug management.   Julia Rangel 37 y.o. presented today for abdominal pain. Working DDx that I considered at this time includes, but not limited to, gastroenteritis, colitis, small bowel obstruction, appendicitis, cholecystitis, pancreatitis, nephrolithiasis, AAA, UTI, pyelonephritis, ruptured ectopic pregnancy, PID, ovarian torsion.  R/o DDx: gastroenteritis, small bowel obstruction, appendicitis, cholecystitis, pancreatitis, nephrolithiasis, AAA, UTI, pyelonephritis, ruptured ectopic pregnancy, PID, ovarian torsion: These are considered less likely due to history of present illness and physical exam findings.  Review of prior external notes: 08/07/2022 discharge  Unique Tests and My Interpretation:  CBC with differential: Unremarkable CMP: Unremarkable Lipase: Negative UA: Unremarkable Urine pregnancy: Negative EKG: Rate, rhythm, axis, intervals all examined and without medically relevant abnormality. ST segments without concerns for elevations CT Abd/Pelvis with contrast: Colitis without complications  Discussion with Independent Historian:  Husband  Discussion of Management of Tests: None  Risk: Medium: prescription drug management  Risk Stratification Score: none  Plan: On exam patient was in no acute distress and stable vitals.  Patient was tender in the epigastric/right upper quadrant area of her abdomen without peritoneal signs.  The rest of patient's physical exam was unremarkable.  Patient does note that she still has her gallbladder and appendix denies any infectious symptoms at this time including fevers.  Patient is currently afebrile.  Patient will be given morphine for pain management and Zofran for her nausea  and abdominal labs along with a CT abdomen pelvis will be ordered to further evaluate.  Patient stable at this time.  Patient CT shows the patient has colitis without complications.  On recheck patient was resting comfortably after receiving the Zofran and morphine.  I suspect patient's abdominal pain is related to her colitis and since patient is a healthy 37 year old female with no complications of her colitis patient is reasonable for outpatient therapy as she wants to be discharged.  Patient was given 1 dose of Augmentin before she is discharged and given a prescription.  Patient also be given Norco for breakthrough pain.  I encouraged patient to use Tylenol every 6 hours needed for pain and to follow-up with her GI specialist regarding recent symptoms and ER visit for long-term management.  Patient states that she has Zofran at home and does not need prescription at this time.  Patient  was given return precautions. Patient stable for discharge at this time.  Patient verbalized understanding of plan.         Final Clinical Impression(s) / ED Diagnoses Final diagnoses:  Colitis    Rx / DC Orders ED Discharge Orders          Ordered    HYDROcodone-acetaminophen (NORCO) 5-325 MG tablet  Every 6 hours PRN        11/02/22 2254    amoxicillin-clavulanate (AUGMENTIN) 875-125 MG tablet  Every 12 hours        11/02/22 2255              Remi Deter 11/02/22 2258    Terrilee Files, MD 11/03/22 1013

## 2022-11-05 ENCOUNTER — Ambulatory Visit: Payer: BC Managed Care – PPO | Admitting: Gastroenterology

## 2022-11-05 ENCOUNTER — Encounter: Payer: Self-pay | Admitting: Gastroenterology

## 2022-11-05 VITALS — BP 115/69 | HR 75 | Temp 98.1°F | Ht 66.0 in | Wt 168.2 lb

## 2022-11-05 DIAGNOSIS — K5150 Left sided colitis without complications: Secondary | ICD-10-CM

## 2022-11-05 DIAGNOSIS — R197 Diarrhea, unspecified: Secondary | ICD-10-CM

## 2022-11-05 DIAGNOSIS — K625 Hemorrhage of anus and rectum: Secondary | ICD-10-CM | POA: Diagnosis not present

## 2022-11-05 DIAGNOSIS — K640 First degree hemorrhoids: Secondary | ICD-10-CM

## 2022-11-05 NOTE — Progress Notes (Addendum)
GI Office Note    Referring Provider: Anabel Halon, MD Primary Care Physician:  Anabel Halon, MD Primary Gastroenterologist: Dolores Frame, MD  Date:  11/05/2022  ID:  Darlys Gales, DOB 1986/03/31, MRN [id number]   Chief Complaint   Chief Complaint  Patient presents with   ER Follow up    ER Follow up Colitis   History of Present Illness  Noelia Kalfas is a 37 y.o. female with a history of anemia, gestational diabetes, PMDD, migraines presenting today for ED follow up.  Labs 07/10/22: celiac panel negative. Thyroid normal. CRP wnl. CBC and CMP wnl. Given ongoing diarrhea and rectal bleeding she was scheduled for colonoscopy.    Last office visit 07/30/22. Some improvement in rectal pain as long she does not have a BM.  Unable to get compounded hemorrhoid cream as they were out of stock or unable to get it to her.  Still having rectal bleeding with each BM but no bleeding in between.  Using dicyclomine for abdominal pain but does not see much of a difference.  Pain is constant dull cramp usually in the lower abdomen.  Sometimes feels like she may pass out with defecation and continues to go twice daily with a small amount of diarrhea.  Has continued on probiotic.  Advised to stop dicyclomine and try Levsin.  Use compounded hemorrhoid cream 3-4 times daily for 3 weeks.  Proceed with upcoming colonoscopy.   Colonoscopy 08/07/22: -2 polyps in the transverse and ascending -TI normal -colon normal s/p biopsies -non bleeding internal hemorrhoids -Continue Levsin, may use IBGard for cramping/bloating -polyps tubular adenomas -biopsies negative for microscopic colitis -Repeat colonoscopy in 7 years  ED visit 11/02/22.  Presented to the ED with abdominal pain in her lower abdomen that goes throughout her entire abdomen at times and feels nauseous despite taking 8 mg of Zofran the day prior.  Has still been able to eat and drink minimally due to nausea.  Denies any chest pain,  shortness of breath, constipation, dysuria, hematuria.  Lipase negative, CMP unremarkable.  CBC unremarkable. CT as outlined below.  She received Zofran and morphine for pain in the ED.  She was also given 1 dose of Augmentin and discharged with prescription and given Norco for breakthrough pain.  She was encouraged to take Tylenol every 6 hours as needed for pain and follow-up with GI.  CT A/P 11/02/22: -Fluid noted throughout colon with mild wall thickening and pericolonic inflammatory change particularly in the descending colon consistent with colitis -No evidence of obstruction -No evidence of gallstones or gallbladder wall thickening or biliary dilation. -Pancreas unremarkable.  Today:  Was feeling nauseas and fatigued and some ongoing abdominal pain the Tuesday prior to her ED visit. Felt like she was constipated feeling prior as well and thought it was her appendix. She was shaking as well she was in so much pain.  Has been compliant with Augmentin.  Has seen an improvement in her abdominal pain and today has not needed any pain medication.   Levsin works much better than the dicyclomine. Still has 1-2 BM daily and small amounts of loose stool. At times feels like she has to go but nothing comes out. Has tried IBGard and that does help some as well.   Nausea is still present since  the colitis but was better since her previous visit. No recent sick contacts that she is aware of. She went to the beach about a week prior to all of this occurring.  Still having some bleeding but not as often as usual. Usually occurs with a lot of activity. No rectal pain.   Current Outpatient Medications  Medication Sig Dispense Refill   amoxicillin-clavulanate (AUGMENTIN) 875-125 MG tablet Take 1 tablet by mouth every 12 (twelve) hours. 14 tablet 0   Bacillus Coagulans-Inulin (PROBIOTIC-PREBIOTIC PO) Take 2 tablets by mouth in the morning. Lemme Debloat - Digestive & Gut Health Gummies with Probiotics &  Prebiotic     EMGALITY 120 MG/ML SOAJ INJECT 1 ML SUBCUTANEOUSLY  ONCE EVERY MONTH 1 mL 5   FLUoxetine (PROZAC) 20 MG tablet Take 1 tablet by mouth once daily (Patient taking differently: Take 20 mg by mouth at bedtime. 20 mg + 40 mg=60 mg daily) 30 tablet 11   FLUoxetine (PROZAC) 40 MG capsule Take 1 capsule by mouth once daily (Patient taking differently: Take 40 mg by mouth at bedtime. 40 mg + 20 mg=60 mg daily) 30 capsule 11   hydrocortisone 2.5 % cream Apply 1 Application topically 2 (two) times daily as needed (skin irritation.).     hyoscyamine (LEVSIN) 0.125 MG tablet Take 1 tablet (0.125 mg total) by mouth every 8 (eight) hours as needed. 30 tablet 0   MELATONIN-THEANINE PO Take 2 tablets by mouth at bedtime. Lemme Sleep Gummies with 5mg  Melatonin, Elderberry, Magnesium, L-Theanine, Chamomile and Lavender     SUMAtriptan (IMITREX) 100 MG tablet Take 100 mg by mouth every 2 (two) hours as needed for migraine. May repeat in 2 hours if headache persists or recurs.     tretinoin (RETIN-A) 0.025 % cream Apply 1 Application topically in the morning.     No current facility-administered medications for this visit.    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   Dysplasia of cervix    Family history of BRCA1 gene positive    Family history of breast cancer    History of cesarean delivery 10/09/2016   X2 plans rpeat   History of kidney stones    Menorrhagia    Migraines    since age 22 per patient   Patient desires pregnancy 06/01/2013   PMDD (premenstrual dysphoric disorder)    PROZAC 20 MG BUT NONE WITH PREGNANCY   PONV (postoperative nausea and vomiting)    Vaginal Pap smear, abnormal     Past Surgical History:  Procedure Laterality Date   BICEPT TENODESIS Right 11/09/2021   Procedure: BICEPS TENODESIS;  Surgeon: Oliver Barre, MD;  Location: [facility];  Service: Orthopedics;  Laterality: Right;   BIOPSY N/A 05/04/2021    Procedure: BIOPSY;  Surgeon: Malissa Hippo, MD;  Location: [facility];  Service: Endoscopy;  Laterality: N/A;   BIOPSY  08/07/2022   Procedure: BIOPSY;  Surgeon: Dolores Frame, MD;  Location: [facility];  Service: Gastroenterology;;   CESAREAN SECTION  2010   CESAREAN SECTION N/A 03/29/2014   Procedure: REPEAT CESAREAN SECTION;  Surgeon: Lazaro Arms, MD;  Location: [facility];  Service: Obstetrics;  Laterality: N/A;   CESAREAN SECTION WITH BILATERAL TUBAL LIGATION Bilateral 05/08/2017   Procedure: CESAREAN SECTION WITH BILATERAL TUBAL LIGATION;  Surgeon: Tilda Burrow, MD;  Location: [facility];  Service: Obstetrics;  Laterality: Bilateral;   COLONOSCOPY N/A 05/04/2021   Procedure: COLONOSCOPY;  Surgeon: Malissa Hippo, MD;  Location: [facility];  Service: Endoscopy;  Laterality: N/A;  1245   COLONOSCOPY WITH PROPOFOL N/A 08/07/2022  Procedure: COLONOSCOPY WITH PROPOFOL;  Surgeon: Dolores Frame, MD;  Location: [facility];  Service: Gastroenterology;  Laterality: N/A;  830am, asa 2   HYSTERECTOMY ABDOMINAL WITH SALPINGECTOMY N/A 09/15/2019   Procedure: HYSTERECTOMY ABDOMINAL and OPEN BILATERAL SALPINGECTOMY;  Surgeon: Tilda Burrow, MD;  Location: [facility];  Service: Gynecology;  Laterality: N/A;   KIDNEY STONE SURGERY  2010   stent   LEEP  2008   phylloid removed  2009   left breast   POLYPECTOMY  08/07/2022   Procedure: POLYPECTOMY INTESTINAL;  Surgeon: Dolores Frame, MD;  Location: [facility];  Service: Gastroenterology;;   SHOULDER ARTHROSCOPY WITH DEBRIDEMENT AND BICEP TENDON REPAIR Right 11/09/2021   Procedure: SHOULDER ARTHROSCOPY WITH DEBRIDEMENT OF CALCIFIC TENDONITIS AND BICEP TENDON REPAIR;  Surgeon: Oliver Barre, MD;  Location: [facility];  Service: Orthopedics;  Laterality: Right;   TONSILLECTOMY     WISDOM TOOTH EXTRACTION      Family History  Problem Relation Age of Onset   Diabetes Maternal Grandmother    Breast cancer  Maternal Grandmother        dx 40s-50   Diabetes Paternal Grandmother    Breast cancer Paternal Grandmother 27   COPD Paternal Grandfather    Heart disease Maternal Grandfather    Breast cancer Mother 71   BRCA 1/2 Mother        BRCA1 pos   Ovarian cancer Mother    Other Mother        peritoneal cancer   Colon polyps Maternal Uncle    SIDS Maternal Aunt        2 babies died of SIDS at 4 months    Allergies as of 11/05/2022   (No Known Allergies)    Social History   Socioeconomic History   Marital status: Married    Spouse name: Not on file   Number of children: 3   Years of education: Not on file   Highest education level: Not on file  Occupational History   Not on file  Tobacco Use   Smoking status: Never   Smokeless tobacco: Never  Vaping Use   Vaping Use: Never used  Substance and Sexual Activity   Alcohol use: Yes    Alcohol/week: 7.0 standard drinks of alcohol    Types: 7 Glasses of wine per week   Drug use: No   Sexual activity: Not Currently    Birth control/protection: Surgical    Comment: tubal,hysterectomy  Other Topics Concern   Not on file  Social History Narrative   Married for 6 years.Lives with husband and 3 kids.Teacher at [employer].   Social Determinants of Health   Financial Resource Strain: Not on file  Food Insecurity: Not on file  Transportation Needs: Not on file  Physical Activity: Not on file  Stress: Not on file  Social Connections: Not on file     Review of Systems   Gen: Denies fever, chills, anorexia. Denies fatigue, weakness, weight loss.  CV: Denies chest pain, palpitations, syncope, peripheral edema, and claudication. Resp: Denies dyspnea at rest, cough, wheezing, coughing up blood, and pleurisy. GI: See HPI Derm: Denies rash, itching, dry skin Psych: Denies depression, anxiety, memory loss, confusion. No homicidal or suicidal ideation.  Heme: Denies bruising, bleeding, and enlarged lymph  nodes.  Physical Exam   BP 115/69 (BP Location: Right Arm, Patient Position: Sitting, Cuff Size: Normal)   Pulse 75   Temp 98.1 F (36.7 C) (Oral)   Ht  5\' 6"  (1.676 m)   Wt 168 lb 3.2 oz (76.3 kg)   LMP 07/14/2019 (Approximate)   SpO2 99%   BMI 27.15 kg/m   General:   Alert and oriented. No distress noted. Pleasant and cooperative.  Head:  Normocephalic and atraumatic. Eyes:  Conjuctiva clear without scleral icterus. Mouth:  Oral mucosa pink and moist. Good dentition. No lesions. Lungs:  Clear to auscultation bilaterally. No wheezes, rales, or rhonchi. No distress.  Heart:  S1, S2 present without murmurs appreciated.  Abdomen:  +BS, soft, non-tender and non-distended. No rebound or guarding. No HSM or masses noted.  Rectal: deferred Msk:  Symmetrical without gross deformities. Normal posture. Extremities:  Without edema. Neurologic:  Alert and  oriented x4 Psych:  Alert and cooperative. Normal mood and affect.  Assessment  Gyanna Dipierro is a 37 y.o. female with a history of anemia, gestational diabetes, PMDD, migraines presenting today for ED follow up.   Rectal bleeding, hemorrhoids: Continues to have some intermittent rectal bleeding likely secondary to her hemorrhoids as bleeding tends to occur more after a lot of physical activity.  She does have history of fissure that was treated with compounded hemorrhoid cream.  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  Continues to use Levsin and Zofran as needed and feels as though the Levsin is much more helpful than the dicyclomine.  Has tried IBgard which she uses as needed and does feel like this helps some as well but does not like the side effect of burping mint.   Recent Colitis: ED visit last week for nausea, abdominal pain, and overall feeling fatigued and ill for few days.  She had CT evidence of descending colitis, treated with Zofran,  morphine, and given a dose of Augmentin and sent home with 10-day prescription.  She has been compliant with medication and since discharge her nausea and pain are improving and today she has not needed any medication.  She is able to eat well without issue.  Advised her to complete her entire 10-day course of Augmentin and we discussed ED precautions and monitoring for fevers and recurrent abdominal pain.  PLAN   IBGard as needed Levsin as needed Continue complete course of Augmentin.  Zofran as needed.  ED precautions discussed. Follow up in 6 months    Brooke Bonito, MSN, FNP-BC, AGACNP-BC [facility]  I have reviewed the note and agree with the APP's assessment as described in this progress note  Katrinka Blazing, MD Gastroenterology and Hepatology [facility]

## 2022-11-05 NOTE — Patient Instructions (Addendum)
Please complete your entire 10 days of Augmentin.  You should continue to see slow improvement of your abdominal tenderness the longer you are on antibiotics.  If for any reason you develop recurrent abdominal pain or fevers while on your vacation then you should go back to the ED  You may continue to use the Levsin and Zofran as needed.  Although IBgard caused use to have some unpleasant burps I would recommend that you continue to use this as needed as well to help with any abdominal cramping.  Follow up in 6 months, sooner if needed.  If you have return of colitis or any severe abdominal pain between now and your next office visit please reach out via MyChart.  Since you just had a colonoscopy in April we do not necessarily need to repeat a colonoscopy given your recent colitis but if you did have recurrence over the next 3 to 6 months we may need to consider this.  Please have a safe trip to [place]!  It was a pleasure to see you today. I want to create trusting relationships with patients. If you receive a survey regarding your visit,  I greatly appreciate you taking time to fill this out on paper or through your MyChart. I value your feedback.  Brooke Bonito, MSN, FNP-BC, AGACNP-BC [facility]

## 2022-12-03 ENCOUNTER — Ambulatory Visit: Payer: BC Managed Care – PPO | Admitting: Internal Medicine

## 2022-12-03 ENCOUNTER — Encounter: Payer: Self-pay | Admitting: Internal Medicine

## 2022-12-03 VITALS — BP 92/63 | HR 83 | Ht 66.0 in | Wt 166.0 lb

## 2022-12-03 DIAGNOSIS — E663 Overweight: Secondary | ICD-10-CM

## 2022-12-03 DIAGNOSIS — K580 Irritable bowel syndrome with diarrhea: Secondary | ICD-10-CM | POA: Diagnosis not present

## 2022-12-03 DIAGNOSIS — E039 Hypothyroidism, unspecified: Secondary | ICD-10-CM | POA: Diagnosis not present

## 2022-12-03 DIAGNOSIS — G43709 Chronic migraine without aura, not intractable, without status migrainosus: Secondary | ICD-10-CM

## 2022-12-03 MED ORDER — RIFAXIMIN 550 MG PO TABS
550.0000 mg | ORAL_TABLET | Freq: Three times a day (TID) | ORAL | 0 refills | Status: DC
Start: 2022-12-03 — End: 2023-06-05

## 2022-12-03 NOTE — Assessment & Plan Note (Signed)
Chronic, watery to loose BM -likely IBS-D Followed by GI On Levsin now -provides minimal relief Has tried dicyclomine, Benefiber and probiotics without any relief Started Xifaxan

## 2022-12-03 NOTE — Assessment & Plan Note (Signed)
Lab Results  Component Value Date   TSH 1.990 07/27/2022   Chart review does not show any history of elevated TSH or low free T4 Was on NP thyroid 60 mg daily, appeared to be oversupplemented, tapered NP thyroid TSH and free T4 WNL now

## 2022-12-03 NOTE — Patient Instructions (Signed)
Please take Xifaxan as prescribed for IBS-D.  Please continue to take medications as prescribed.  Please continue to follow low carb diet and perform moderate exercise/walking at least 150 mins/week.

## 2022-12-03 NOTE — Assessment & Plan Note (Addendum)
BMI Readings from Last 3 Encounters:  12/03/22 26.79 kg/m  11/05/22 27.15 kg/m  11/02/22 26.63 kg/m    Continue to follow low-carb diet and perform moderate exercise/walking as tolerated Had good response with GLP-1 agonist in the past Was on Wegovy - initial BMI - 33.93 - had excellent response, but had had to stop due to insurance coverage concern On compounded semaglutide now through [facility]

## 2022-12-03 NOTE — Assessment & Plan Note (Signed)
Well controlled with Emgality and as needed Imitrex Does not have a neurologist currently

## 2022-12-03 NOTE — Assessment & Plan Note (Deleted)
Chronic, watery to loose BM -likely IBS-D Followed by GI On Levsin now -provides minimal relief Has tried dicyclomine, Benefiber and probiotics without any relief Started Xifaxan

## 2022-12-03 NOTE — Progress Notes (Signed)
Established Patient Office Visit  Subjective:  Patient ID: Julia Rangel, female    DOB: 24-May-1985  Age: 37 y.o. MRN: [id number]  CC:  Chief Complaint  Patient presents with   Migraine    Four month follow up     HPI Julia Rangel is a 37 y.o. female with past medical history of migraine, IDA, HLD and obesity who presents for f/u of her chronic medical conditions.  Migraine: Currently well controlled with Emgality and as needed Imitrex.  She requires Imitrex very rarely now.   She has history of obesity, for which she has been following low-carb diet and had been on Wegovy.  She had lost about 30 lbs since the last visit. She lost coverage for [facility] in 04/24. She has started compounded semaglutide injections through [facility] clinic now.  She comes complains of chronic diarrhea, watery to loose BM, about 3 times in a day.  She has abdominal pain and cramping before and during bowel movement.  She also has rectal bleeding after bowel movement from hemorrhoids.  She has tried dicyclomine without much relief.  She had visit with GI and has been placed on Levsin now, which provides mild relief.  She recently had colitis noted on CT abdomen in ER, which was treated with Augmentin.  She takes Prozac 60 mg once daily for GAD.  Denies anhedonia, SI or HI currently.  Her thyroid function tests have been wnl without NP thyroid now.  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   Dysplasia of cervix    Family history of BRCA1 gene positive    Family history of breast cancer    History of cesarean delivery 10/09/2016   X2 plans rpeat   History of kidney stones    Menorrhagia    Migraines    since age 25 per patient   Patient desires pregnancy 06/01/2013   PMDD (premenstrual dysphoric disorder)    PROZAC 20 MG BUT NONE WITH PREGNANCY   PONV (postoperative nausea and vomiting)    Vaginal Pap smear, abnormal     Past Surgical  History:  Procedure Laterality Date   BICEPT TENODESIS Right 11/09/2021   Procedure: BICEPS TENODESIS;  Surgeon: Oliver Barre, MD;  Location: [facility];  Service: Orthopedics;  Laterality: Right;   BIOPSY N/A 05/04/2021   Procedure: BIOPSY;  Surgeon: Malissa Hippo, MD;  Location: [facility];  Service: Endoscopy;  Laterality: N/A;   BIOPSY  08/07/2022   Procedure: BIOPSY;  Surgeon: Dolores Frame, MD;  Location: [facility];  Service: Gastroenterology;;   CESAREAN SECTION  2010   CESAREAN SECTION N/A 03/29/2014   Procedure: REPEAT CESAREAN SECTION;  Surgeon: Lazaro Arms, MD;  Location: [facility];  Service: Obstetrics;  Laterality: N/A;   CESAREAN SECTION WITH BILATERAL TUBAL LIGATION Bilateral 05/08/2017   Procedure: CESAREAN SECTION WITH BILATERAL TUBAL LIGATION;  Surgeon: Tilda Burrow, MD;  Location: [facility];  Service: Obstetrics;  Laterality: Bilateral;   COLONOSCOPY N/A 05/04/2021   Procedure: COLONOSCOPY;  Surgeon: Malissa Hippo, MD;  Location: [facility];  Service: Endoscopy;  Laterality: N/A;  1245   COLONOSCOPY WITH PROPOFOL N/A 08/07/2022   Procedure: COLONOSCOPY WITH PROPOFOL;  Surgeon: Dolores Frame, MD;  Location: [facility];  Service: Gastroenterology;  Laterality: N/A;  830am, asa 2   HYSTERECTOMY ABDOMINAL WITH SALPINGECTOMY N/A 09/15/2019   Procedure: HYSTERECTOMY ABDOMINAL and  OPEN BILATERAL SALPINGECTOMY;  Surgeon: Tilda Burrow, MD;  Location: [facility];  Service: Gynecology;  Laterality: N/A;   KIDNEY STONE SURGERY  2010   stent   LEEP  2008   phylloid removed  2009   left breast   POLYPECTOMY  08/07/2022   Procedure: POLYPECTOMY INTESTINAL;  Surgeon: Dolores Frame, MD;  [facility]: [facility];  Service: Gastroenterology;;   SHOULDER ARTHROSCOPY WITH DEBRIDEMENT AND BICEP TENDON REPAIR Right 11/09/2021   Procedure: SHOULDER ARTHROSCOPY WITH DEBRIDEMENT OF CALCIFIC TENDONITIS AND BICEP TENDON REPAIR;  Surgeon: Oliver Barre, MD;  Location: [facility];  Service: Orthopedics;  Laterality: Right;   TONSILLECTOMY     WISDOM TOOTH EXTRACTION      Family History  Problem Relation Age of Onset   Diabetes Maternal Grandmother    Breast cancer Maternal Grandmother        dx 40s-50   Diabetes Paternal Grandmother    Breast cancer Paternal Grandmother 84   COPD Paternal Grandfather    Heart disease Maternal Grandfather    Breast cancer Mother 87   BRCA 1/2 Mother        BRCA1 pos   Ovarian cancer Mother    Other Mother        peritoneal cancer   Colon polyps Maternal Uncle    SIDS Maternal Aunt        2 babies died of SIDS at 37 months    Social History   Socioeconomic History   Marital status: Married    Spouse name: Not on file   Number of children: 3   Years of education: Not on file   Highest education level: Not on file  Occupational History   Not on file  Tobacco Use   Smoking status: Never   Smokeless tobacco: Never  Vaping Use   Vaping status: Never Used  Substance and Sexual Activity   Alcohol use: Yes    Alcohol/week: 7.0 standard drinks of alcohol    Types: 7 Glasses of wine per week   Drug use: No   Sexual activity: Not Currently    Birth control/protection: Surgical    Comment: tubal,hysterectomy  Other Topics Concern   Not on file  Social History Narrative   Married for 6 years.Lives with husband and 3 kids.Teacher at [employer].   Social Determinants of Health   Financial Resource Strain: Not on file  Food Insecurity: Not on file  Transportation Needs: Not on file  Physical Activity: Not on file  Stress: Not on file  Social Connections: Not on file  Intimate Partner Violence: Not on file    Outpatient Medications Prior to Visit  Medication Sig Dispense Refill   Bacillus Coagulans-Inulin (PROBIOTIC-PREBIOTIC PO) Take 2 tablets by mouth in the morning. Lemme Debloat - Digest[facility] Gummies with Probiotics & Prebiotic     EMGALITY 120 MG/ML  SOAJ INJECT 1 ML SUBCUTANEOUSLY  ONCE EVERY MONTH 1 mL 5   FLUoxetine (PROZAC) 20 MG tablet Take 1 tablet by mouth once daily (Patient taking differently: Take 20 mg by mouth at bedtime. 20 mg + 40 mg=60 mg daily) 30 tablet 11   FLUoxetine (PROZAC) 40 MG capsule Take 1 capsule by mouth once daily (Patient taking differently: Take 40 mg by mouth at bedtime. 40 mg + 20 mg=60 mg daily) 30 capsule 11   hydrocortisone 2.5 % cream Apply 1 Application topically 2 (two) times daily as needed (skin irritation.).  hyoscyamine (LEVSIN) 0.125 MG tablet Take 1 tablet (0.125 mg total) by mouth every 8 (eight) hours as needed. 30 tablet 0   MELATONIN-THEANINE PO Take 2 tablets by mouth at bedtime. Lemme Sleep Gummies with 5mg  Melatonin, Elderberry, Magnesium, L-Theanine, Chamomile and Lavender     ondansetron (ZOFRAN) 4 MG tablet Take 4 mg by mouth every 8 (eight) hours as needed for nausea or vomiting.     SUMAtriptan (IMITREX) 100 MG tablet Take 100 mg by mouth every 2 (two) hours as needed for migraine. May repeat in 2 hours if headache persists or recurs.     tretinoin (RETIN-A) 0.025 % cream Apply 1 Application topically in the morning.     amoxicillin-clavulanate (AUGMENTIN) 875-125 MG tablet Take 1 tablet by mouth every 12 (twelve) hours. 14 tablet 0   No facility-administered medications prior to visit.    No Known Allergies  ROS Review of Systems  Constitutional:  Negative for chills and fever.  HENT:  Negative for congestion, sinus pressure, sinus pain and sore throat.   Eyes:  Negative for pain and discharge.  Respiratory:  Negative for cough and shortness of breath.   Cardiovascular:  Negative for chest pain and palpitations.  Gastrointestinal:  Positive for diarrhea. Negative for abdominal pain, nausea and vomiting.  Endocrine: Negative for polydipsia and polyuria.  Genitourinary:  Negative for dysuria and hematuria.  Musculoskeletal:  Negative for neck pain and neck stiffness.  Skin:   Negative for rash.  Neurological:  Positive for tremors. Negative for dizziness and weakness.  Psychiatric/Behavioral:  Negative for agitation and behavioral problems.       Objective:    Physical Exam Vitals reviewed.  Constitutional:      General: She is not in acute distress.    Appearance: She is obese. She is not diaphoretic.  HENT:     Head: Normocephalic and atraumatic.     Nose: Nose normal.     Mouth/Throat:     Mouth: Mucous membranes are moist.  Eyes:     General: No scleral icterus.    Extraocular Movements: Extraocular movements intact.  Cardiovascular:     Rate and Rhythm: Normal rate and regular rhythm.     Pulses: Normal pulses.     Heart sounds: Normal heart sounds. No murmur heard. Pulmonary:     Breath sounds: Normal breath sounds. No wheezing or rales.  Abdominal:     Palpations: Abdomen is soft.     Tenderness: There is no abdominal tenderness.  Musculoskeletal:     Cervical back: Neck supple. No tenderness.     Right lower leg: No edema.     Left lower leg: No edema.  Skin:    General: Skin is warm.     Findings: No rash.  Neurological:     General: No focal deficit present.     Mental Status: She is alert and oriented to person, place, and time.  Psychiatric:        Mood and Affect: Mood normal.        Behavior: Behavior normal.     BP 92/63 (BP Location: Left Arm, Patient Position: Sitting, Cuff Size: Normal)   Pulse 83   Ht 5\' 6"  (1.676 m)   Wt 166 lb (75.3 kg)   LMP 07/14/2019 (Approximate)   SpO2 98%   BMI 26.79 kg/m  Wt Readings from Last 3 Encounters:  12/03/22 166 lb (75.3 kg)  11/05/22 168 lb 3.2 oz (76.3 kg)  11/02/22 165 lb (74.8 kg)  Lab Results  Component Value Date   TSH 1.990 07/27/2022   Lab Results  Component Value Date   WBC 6.4 11/02/2022   HGB 13.2 11/02/2022   HCT 38.1 11/02/2022   MCV 93.2 11/02/2022   PLT 279 11/02/2022   Lab Results  Component Value Date   NA 135 11/02/2022   K 3.8 11/02/2022    CO2 22 11/02/2022   GLUCOSE 99 11/02/2022   BUN 21 (H) 11/02/2022   CREATININE 0.99 11/02/2022   BILITOT 0.6 11/02/2022   ALKPHOS 53 11/02/2022   AST 18 11/02/2022   ALT 17 11/02/2022   PROT 7.9 11/02/2022   ALBUMIN 4.4 11/02/2022   CALCIUM 9.5 11/02/2022   ANIONGAP 11 11/02/2022   EGFR 110 07/27/2022   Lab Results  Component Value Date   CHOL 175 07/27/2022   Lab Results  Component Value Date   HDL 62 07/27/2022   Lab Results  Component Value Date   LDLCALC 90 07/27/2022   Lab Results  Component Value Date   TRIG 129 07/27/2022   Lab Results  Component Value Date   CHOLHDL 2.8 07/27/2022   Lab Results  Component Value Date   HGBA1C 4.8 07/27/2022      Assessment & Plan:   Problem List Items Addressed This Visit       Cardiovascular and Mediastinum   Migraine - Primary    Well controlled with Emgality and as needed Imitrex Does not have a neurologist currently        Digestive   Irritable bowel syndrome with diarrhea    Chronic, watery to loose BM -likely IBS-D Followed by GI On Levsin now -provides minimal relief Has tried dicyclomine, Benefiber and probiotics without any relief Started Xifaxan      Relevant Medications   rifaximin (XIFAXAN) 550 MG TABS tablet     Endocrine   Hypothyroidism    Lab Results  Component Value Date   TSH 1.990 07/27/2022   Chart review does not show any history of elevated TSH or low free T4 Was on NP thyroid 60 mg daily, appeared to be oversupplemented, tapered NP thyroid TSH and free T4 WNL now        Other   Overweight    BMI Readings from Last 3 Encounters:  12/03/22 26.79 kg/m  11/05/22 27.15 kg/m  11/02/22 26.63 kg/m    Continue to follow low-carb diet and perform moderate exercise/walking as tolerated Had good response with GLP-1 agonist in the past Was on Wegovy - initial BMI - 33.93 - had excellent response, but had had to stop due to insurance coverage concern On compounded semaglutide  now through [facility]        Meds ordered this encounter  Medications   rifaximin (XIFAXAN) 550 MG TABS tablet    Sig: Take 1 tablet (550 mg total) by mouth 3 (three) times daily.    Dispense:  42 tablet    Refill:  0    Follow-up: Return in about 6 months (around 06/05/2023) for MIgraine.    Anabel Halon, MD

## 2022-12-12 ENCOUNTER — Other Ambulatory Visit: Payer: Self-pay | Admitting: Obstetrics & Gynecology

## 2022-12-27 ENCOUNTER — Other Ambulatory Visit: Payer: Self-pay | Admitting: Medical Genetics

## 2022-12-27 DIAGNOSIS — Z006 Encounter for examination for normal comparison and control in clinical research program: Secondary | ICD-10-CM

## 2023-01-12 ENCOUNTER — Other Ambulatory Visit: Payer: Self-pay | Admitting: Internal Medicine

## 2023-01-12 DIAGNOSIS — G43809 Other migraine, not intractable, without status migrainosus: Secondary | ICD-10-CM

## 2023-02-07 ENCOUNTER — Encounter: Payer: Self-pay | Admitting: Internal Medicine

## 2023-02-15 ENCOUNTER — Other Ambulatory Visit: Payer: Self-pay | Admitting: Gastroenterology

## 2023-02-15 MED ORDER — HYOSCYAMINE SULFATE 0.125 MG PO TABS: 0.1250 mg | ORAL_TABLET | Freq: Three times a day (TID) | ORAL | 0 refills | Status: AC | PRN

## 2023-03-05 ENCOUNTER — Other Ambulatory Visit: Payer: Self-pay | Admitting: Internal Medicine

## 2023-03-05 DIAGNOSIS — G43809 Other migraine, not intractable, without status migrainosus: Secondary | ICD-10-CM

## 2023-03-11 ENCOUNTER — Other Ambulatory Visit (HOSPITAL_COMMUNITY): Payer: BC Managed Care – PPO | Attending: Medical Genetics

## 2023-03-31 ENCOUNTER — Other Ambulatory Visit: Payer: Self-pay | Admitting: Internal Medicine

## 2023-03-31 DIAGNOSIS — G43809 Other migraine, not intractable, without status migrainosus: Secondary | ICD-10-CM

## 2023-04-11 ENCOUNTER — Encounter: Payer: Self-pay | Admitting: Internal Medicine

## 2023-04-18 ENCOUNTER — Encounter: Payer: Self-pay | Admitting: Internal Medicine

## 2023-04-25 ENCOUNTER — Other Ambulatory Visit: Payer: Self-pay | Admitting: Internal Medicine

## 2023-04-25 DIAGNOSIS — G43809 Other migraine, not intractable, without status migrainosus: Secondary | ICD-10-CM

## 2023-05-06 ENCOUNTER — Other Ambulatory Visit: Payer: Self-pay | Admitting: Advanced Practice Midwife

## 2023-05-27 ENCOUNTER — Other Ambulatory Visit: Payer: Self-pay | Admitting: Internal Medicine

## 2023-05-27 DIAGNOSIS — G43809 Other migraine, not intractable, without status migrainosus: Secondary | ICD-10-CM

## 2023-06-05 ENCOUNTER — Ambulatory Visit: Payer: 59 | Admitting: Internal Medicine

## 2023-06-05 ENCOUNTER — Encounter: Payer: Self-pay | Admitting: Internal Medicine

## 2023-06-05 ENCOUNTER — Other Ambulatory Visit (HOSPITAL_COMMUNITY): Payer: Self-pay

## 2023-06-05 VITALS — BP 115/79 | HR 82 | Ht 66.0 in | Wt 190.0 lb

## 2023-06-05 DIAGNOSIS — G43709 Chronic migraine without aura, not intractable, without status migrainosus: Secondary | ICD-10-CM | POA: Diagnosis not present

## 2023-06-05 DIAGNOSIS — Z23 Encounter for immunization: Secondary | ICD-10-CM | POA: Diagnosis not present

## 2023-06-05 DIAGNOSIS — K580 Irritable bowel syndrome with diarrhea: Secondary | ICD-10-CM

## 2023-06-05 DIAGNOSIS — G43809 Other migraine, not intractable, without status migrainosus: Secondary | ICD-10-CM

## 2023-06-05 DIAGNOSIS — E039 Hypothyroidism, unspecified: Secondary | ICD-10-CM | POA: Diagnosis not present

## 2023-06-05 DIAGNOSIS — E66811 Obesity, class 1: Secondary | ICD-10-CM

## 2023-06-05 MED ORDER — SEMAGLUTIDE-WEIGHT MANAGEMENT 0.25 MG/0.5ML ~~LOC~~ SOAJ
0.2500 mg | SUBCUTANEOUS | 0 refills | Status: DC
Start: 2023-06-05 — End: 2023-07-03
  Filled 2023-06-05 – 2023-06-14 (×6): qty 2, 28d supply, fill #0

## 2023-06-05 MED ORDER — SEMAGLUTIDE-WEIGHT MANAGEMENT 0.5 MG/0.5ML ~~LOC~~ SOAJ
0.5000 mg | SUBCUTANEOUS | 0 refills | Status: DC
Start: 2023-07-04 — End: 2023-06-17
  Filled 2023-06-05 – 2023-06-13 (×2): qty 2, 28d supply, fill #0

## 2023-06-05 MED ORDER — EMGALITY 120 MG/ML ~~LOC~~ SOAJ: SUBCUTANEOUS | 4 refills | Status: DC

## 2023-06-05 NOTE — Patient Instructions (Signed)
Please start taking Wegovy as prescribed.  Please continue to take medications as prescribed.  Please continue to follow low carb diet and perform moderate exercise/walking at least 150 mins/week.

## 2023-06-05 NOTE — Assessment & Plan Note (Signed)
Lab Results  Component Value Date   TSH 1.990 07/27/2022   Chart review does not show any history of elevated TSH or low free T4 Was on NP thyroid 60 mg daily, appeared to be oversupplemented, tapered NP thyroid TSH and free T4 WNL now

## 2023-06-05 NOTE — Progress Notes (Signed)
Established Patient Office Visit  Subjective:  Patient ID: Julia Rangel, female    DOB: [birth year]  Age: 38 y.o. MRN: [id number]  CC:  Chief Complaint  Patient presents with   Care Management    6 month f/u, would like to discuss weight management, would like to try wegovy again. Has changed insurance.     HPI Julia Rangel is a 38 y.o. female with past medical history of migraine, IDA, HLD and obesity who presents for f/u of her chronic medical conditions.  Migraine: Currently well controlled with Emgality and as needed Imitrex.  She requires Imitrex very rarely now.   She has history of obesity, for which she has been following low-carb diet and has continued regular exercise regimen for more than a year now. She had been on Wegovy in the past.  She had lost about 30 lbs with Wegovy. She lost coverage for [facility] in 04/24. She had started compounded semaglutide injections through [facility] hormone clinic, but could not continue due to lack of efficacy.  She has chronic diarrhea, which has improved with Xifaxan.  She still takes Levsin as needed for abdominal cramping.  Her abdominal pain and cramping before and during bowel movement have also improved.  She also has rectal bleeding after bowel movement from hemorrhoids.  She takes Prozac 60 mg once daily for GAD.  Denies anhedonia, SI or HI currently.  Her thyroid function tests have been wnl without NP thyroid now.  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   Dysplasia of cervix    Family history of BRCA1 gene positive    Family history of breast cancer    History of cesarean delivery 10/09/2016   X2 plans rpeat   History of kidney stones    Menorrhagia    Migraines    since age 34 per patient   Patient desires pregnancy 06/01/2013   PMDD (premenstrual dysphoric disorder)    PROZAC 20 MG BUT NONE WITH PREGNANCY   PONV (postoperative nausea and vomiting)    Vaginal Pap  smear, abnormal     Past Surgical History:  Procedure Laterality Date   BICEPT TENODESIS Right 11/09/2021   Procedure: BICEPS TENODESIS;  Surgeon: Oliver Barre, MD;  Location: [facility];  Service: Orthopedics;  Laterality: Right;   BIOPSY N/A 05/04/2021   Procedure: BIOPSY;  Surgeon: Malissa Hippo, MD;  Location: [facility];  Service: Endoscopy;  Laterality: N/A;   BIOPSY  08/07/2022   Procedure: BIOPSY;  Surgeon: Dolores Frame, MD;  Location: [facility];  Service: Gastroenterology;;   CESAREAN SECTION  2010   CESAREAN SECTION N/A 03/29/2014   Procedure: REPEAT CESAREAN SECTION;  Surgeon: Lazaro Arms, MD;  Location: [facility];  Service: Obstetrics;  Laterality: N/A;   CESAREAN SECTION WITH BILATERAL TUBAL LIGATION Bilateral 05/08/2017   Procedure: CESAREAN SECTION WITH BILATERAL TUBAL LIGATION;  Surgeon: Tilda Burrow, MD;  Location: [facility];  Service: Obstetrics;  Laterality: Bilateral;   COLONOSCOPY N/A 05/04/2021   Procedure: COLONOSCOPY;  Surgeon: Malissa Hippo, MD;  Location: [facility];  Service: Endoscopy;  Laterality: N/A;  1245   COLONOSCOPY WITH PROPOFOL N/A 08/07/2022   Procedure: COLONOSCOPY WITH PROPOFOL;  Surgeon: Dolores Frame, MD;  Location: [facility];  Service: Gastroenterology;  Laterality: N/A;  830am, asa 2   HYSTERECTOMY ABDOMINAL WITH SALPINGECTOMY N/A 09/15/2019   Procedure: HYSTERECTOMY ABDOMINAL and  OPEN BILATERAL SALPINGECTOMY;  Surgeon: Tilda Burrow, MD;  Location: [facility];  Service: Gynecology;  Laterality: N/A;   KIDNEY STONE SURGERY  2010   stent   LEEP  2008   phylloid removed  2009   left breast   POLYPECTOMY  08/07/2022   Procedure: POLYPECTOMY INTESTINAL;  Surgeon: Dolores Frame, MD;  Location: [facility];  Service: Gastroenterology;;   SHOULDER ARTHROSCOPY WITH DEBRIDEMENT AND BICEP TENDON REPAIR Right 11/09/2021   Procedure: SHOULDER ARTHROSCOPY WITH DEBRIDEMENT OF CALCIFIC TENDONITIS AND  BICEP TENDON REPAIR;  Surgeon: Oliver Barre, MD;  Location: [facility];  Service: Orthopedics;  Laterality: Right;   TONSILLECTOMY     WISDOM TOOTH EXTRACTION      Family History  Problem Relation Age of Onset   Diabetes Maternal Grandmother    Breast cancer Maternal Grandmother        dx 40s-50   Diabetes Paternal Grandmother    Breast cancer Paternal Grandmother 26   COPD Paternal Grandfather    Heart disease Maternal Grandfather    Breast cancer Mother 86   BRCA 1/2 Mother        BRCA1 pos   Ovarian cancer Mother    Other Mother        peritoneal cancer   Colon polyps Maternal Uncle    SIDS Maternal Aunt        2 babies died of SIDS at 16 months    Social History   Socioeconomic History   Marital status: Married    Spouse name: Not on file   Number of children: 3   Years of education: Not on file   Highest education level: Not on file  Occupational History   Not on file  Tobacco Use   Smoking status: Never   Smokeless tobacco: Never  Vaping Use   Vaping status: Never Used  Substance and Sexual Activity   Alcohol use: Yes    Alcohol/week: 7.0 standard drinks of alcohol    Types: 7 Glasses of wine per week   Drug use: No   Sexual activity: Not Currently    Birth control/protection: Surgical    Comment: tubal,hysterectomy  Other Topics Concern   Not on file  Social History Narrative   Married for 6 years.Lives with husband and 3 kids.Teacher at [employer].   Social Drivers of Corporate investment banker Strain: Not on file  Food Insecurity: Not on file  Transportation Needs: Not on file  Physical Activity: Not on file  Stress: Not on file  Social Connections: Not on file  Intimate Partner Violence: Not on file    Outpatient Medications Prior to Visit  Medication Sig Dispense Refill   Bacillus Coagulans-Inulin (PROBIOTIC-PREBIOTIC PO) Take 2 tablets by mouth in the morning. Lemme Debloat - Digest[facility] Gummies with Probiotics &  Prebiotic     FLUoxetine (PROZAC) 20 MG tablet Take 1 tablet (20 mg total) by mouth daily. 30 tablet 11   FLUoxetine (PROZAC) 40 MG capsule Take 1 capsule (40 mg total) by mouth at bedtime. 40 mg + 20 mg=60 mg daily 45 capsule 6   hydrocortisone 2.5 % cream Apply 1 Application topically 2 (two) times daily as needed (skin irritation.).     hyoscyamine (LEVSIN) 0.125 MG tablet Take 1 tablet (0.125 mg total) by mouth every 8 (eight) hours as needed. 30 tablet 0   MELATONIN-THEANINE PO Take 2 tablets by mouth at bedtime. Lemme Sleep Gummies with 5mg  Melatonin,  Elderberry, Magnesium, L-Theanine, Chamomile and Lavender     ondansetron (ZOFRAN) 4 MG tablet Take 4 mg by mouth every 8 (eight) hours as needed for nausea or vomiting.     SUMAtriptan (IMITREX) 100 MG tablet Take 100 mg by mouth every 2 (two) hours as needed for migraine. May repeat in 2 hours if headache persists or recurs.     tretinoin (RETIN-A) 0.025 % cream Apply 1 Application topically in the morning.     EMGALITY 120 MG/ML SOAJ INJECT 1 ML SUBCUTANEOUSLY  ONCE EVERY MONTH 1 mL 0   rifaximin (XIFAXAN) 550 MG TABS tablet Take 1 tablet (550 mg total) by mouth 3 (three) times daily. 42 tablet 0   No facility-administered medications prior to visit.    No Known Allergies  ROS Review of Systems  Constitutional:  Negative for chills and fever.  HENT:  Negative for congestion, sinus pressure, sinus pain and sore throat.   Eyes:  Negative for pain and discharge.  Respiratory:  Negative for cough and shortness of breath.   Cardiovascular:  Negative for chest pain and palpitations.  Gastrointestinal:  Positive for diarrhea. Negative for abdominal pain, nausea and vomiting.  Endocrine: Negative for polydipsia and polyuria.  Genitourinary:  Negative for dysuria and hematuria.  Musculoskeletal:  Negative for neck pain and neck stiffness.  Skin:  Negative for rash.  Neurological:  Positive for tremors. Negative for dizziness and weakness.   Psychiatric/Behavioral:  Negative for agitation and behavioral problems.       Objective:    Physical Exam Vitals reviewed.  Constitutional:      General: She is not in acute distress.    Appearance: She is obese. She is not diaphoretic.  HENT:     Head: Normocephalic and atraumatic.     Nose: Nose normal.     Mouth/Throat:     Mouth: Mucous membranes are moist.  Eyes:     General: No scleral icterus.    Extraocular Movements: Extraocular movements intact.  Cardiovascular:     Rate and Rhythm: Normal rate and regular rhythm.     Pulses: Normal pulses.     Heart sounds: Normal heart sounds. No murmur heard. Pulmonary:     Breath sounds: Normal breath sounds. No wheezing or rales.  Musculoskeletal:     Cervical back: Neck supple. No tenderness.     Right lower leg: No edema.     Left lower leg: No edema.  Skin:    General: Skin is warm.     Findings: No rash.  Neurological:     General: No focal deficit present.     Mental Status: She is alert and oriented to person, place, and time.  Psychiatric:        Mood and Affect: Mood normal.        Behavior: Behavior normal.     BP 115/79   Pulse 82   Ht 5\' 6"  (1.676 m)   Wt 190 lb (86.2 kg)   LMP 07/14/2019 (Approximate)   SpO2 98%   BMI 30.67 kg/m  Wt Readings from Last 3 Encounters:  06/05/23 190 lb (86.2 kg)  12/03/22 166 lb (75.3 kg)  11/05/22 168 lb 3.2 oz (76.3 kg)    Lab Results  Component Value Date   TSH 1.990 07/27/2022   Lab Results  Component Value Date   WBC 6.4 11/02/2022   HGB 13.2 11/02/2022   HCT 38.1 11/02/2022   MCV 93.2 11/02/2022   PLT 279 11/02/2022  Lab Results  Component Value Date   NA 135 11/02/2022   K 3.8 11/02/2022   CO2 22 11/02/2022   GLUCOSE 99 11/02/2022   BUN 21 (H) 11/02/2022   CREATININE 0.99 11/02/2022   BILITOT 0.6 11/02/2022   ALKPHOS 53 11/02/2022   AST 18 11/02/2022   ALT 17 11/02/2022   PROT 7.9 11/02/2022   ALBUMIN 4.4 11/02/2022   CALCIUM 9.5  11/02/2022   ANIONGAP 11 11/02/2022   EGFR 110 07/27/2022   Lab Results  Component Value Date   CHOL 175 07/27/2022   Lab Results  Component Value Date   HDL 62 07/27/2022   Lab Results  Component Value Date   LDLCALC 90 07/27/2022   Lab Results  Component Value Date   TRIG 129 07/27/2022   Lab Results  Component Value Date   CHOLHDL 2.8 07/27/2022   Lab Results  Component Value Date   HGBA1C 4.8 07/27/2022      Assessment & Plan:   Problem List Items Addressed This Visit       Cardiovascular and Mediastinum   Migraine - Primary (Chronic)   Well controlled with Emgality and as needed Imitrex Does not have a neurologist currently      Relevant Medications   EMGALITY 120 MG/ML SOAJ     Digestive   Irritable bowel syndrome with diarrhea   Chronic, watery to loose BM -likely IBS-D Followed by GI On Levsin now -provides minimal relief Has tried dicyclomine, Benefiber and probiotics without any relief Completed Xifaxan        Endocrine   Hypothyroidism   Lab Results  Component Value Date   TSH 1.990 07/27/2022   Chart review does not show any history of elevated TSH or low free T4 Was on NP thyroid 60 mg daily, appeared to be oversupplemented, tapered NP thyroid TSH and free T4 WNL now        Other   Obesity (BMI 30.0-34.9)   BMI Readings from Last 3 Encounters:  06/05/23 30.67 kg/m  12/03/22 26.79 kg/m  11/05/22 27.15 kg/m    She has been following low-carb diet and performs moderate exercise/walking for more than a year now Had good response with GLP-1 agonist in the past Was on Wegovy - initial BMI was 33.93 - had excellent response, but had to stop due to insurance coverage concern Restart Wegovy - initial BMI: 30.67 -increase dose as tolerated She is encouraged to continue to follow low-carb diet and engage in physical exercise/walking at least 150 minutes/week      Relevant Medications   Semaglutide-Weight Management 0.25 MG/0.5ML  SOAJ   Semaglutide-Weight Management 0.5 MG/0.5ML SOAJ (Start on 07/04/2023)   Other Visit Diagnoses       Encounter for immunization       Relevant Orders   Flu vaccine trivalent PF, 6mos and older(Flulaval,Afluria,Fluarix,Fluzone) (Completed)         Meds ordered this encounter  Medications   EMGALITY 120 MG/ML SOAJ    Sig: INJECT 1 ML SUBCUTANEOUSLY  ONCE EVERY MONTH    Dispense:  1 mL    Refill:  4   Semaglutide-Weight Management 0.25 MG/0.5ML SOAJ    Sig: Inject 0.25 mg into the skin once a week for 28 days.    Dispense:  2 mL    Refill:  0   Semaglutide-Weight Management 0.5 MG/0.5ML SOAJ    Sig: Inject 0.5 mg into the skin once a week for 28 days.    Dispense:  2 mL  Refill:  0    Follow-up: Return in about 3 months (around 09/03/2023) for Weight management.    Anabel Halon, MD

## 2023-06-05 NOTE — Assessment & Plan Note (Signed)
Well controlled with Emgality and as needed Imitrex Does not have a neurologist currently

## 2023-06-05 NOTE — Assessment & Plan Note (Signed)
Chronic, watery to loose BM -likely IBS-D Followed by GI On Levsin now -provides minimal relief Has tried dicyclomine, Benefiber and probiotics without any relief Completed Xifaxan

## 2023-06-05 NOTE — Assessment & Plan Note (Addendum)
BMI Readings from Last 3 Encounters:  06/05/23 30.67 kg/m  12/03/22 26.79 kg/m  11/05/22 27.15 kg/m    She has been following low-carb diet and performs moderate exercise/walking for more than a year now Had good response with GLP-1 agonist in the past Was on Wegovy - initial BMI was 33.93 - had excellent response, but had to stop due to insurance coverage concern Restart Wegovy - initial BMI: 30.67 -increase dose as tolerated She is encouraged to continue to follow low-carb diet and engage in physical exercise/walking at least 150 minutes/week

## 2023-06-11 ENCOUNTER — Encounter: Payer: Self-pay | Admitting: Internal Medicine

## 2023-06-11 ENCOUNTER — Other Ambulatory Visit (HOSPITAL_COMMUNITY): Payer: Self-pay

## 2023-06-12 ENCOUNTER — Other Ambulatory Visit (HOSPITAL_COMMUNITY): Payer: Self-pay

## 2023-06-12 NOTE — Telephone Encounter (Unsigned)
 Copied from CRM [id number]. Topic: Clinical - Prescription Issue >> Jun 11, 2023  1:53 PM Heather  M wrote: Reason for CRM: Semaglutide -Weight Management 0.25 MG/0.5ML SOAJ, patient may just need a prior authorization for the pharmacy. Patient also has coverage from [facility] [place] through her husbands employer and needs that information added in if it is not there. [phone], asap.

## 2023-06-13 ENCOUNTER — Other Ambulatory Visit (HOSPITAL_COMMUNITY): Payer: Self-pay

## 2023-06-13 NOTE — Telephone Encounter (Signed)
 Insurance printed and faxed to pharmacy requesting them start prior auth under this insurance

## 2023-06-14 ENCOUNTER — Other Ambulatory Visit (HOSPITAL_COMMUNITY): Payer: Self-pay

## 2023-06-17 ENCOUNTER — Other Ambulatory Visit: Payer: Self-pay | Admitting: Internal Medicine

## 2023-06-17 DIAGNOSIS — E66811 Obesity, class 1: Secondary | ICD-10-CM

## 2023-06-17 MED ORDER — PHENTERMINE HCL 37.5 MG PO TABS
37.5000 mg | ORAL_TABLET | Freq: Every day | ORAL | 2 refills | Status: DC
Start: 2023-06-17 — End: 2023-09-10

## 2023-06-19 NOTE — Telephone Encounter (Signed)
Letter faxed.

## 2023-06-20 ENCOUNTER — Other Ambulatory Visit (HOSPITAL_COMMUNITY): Payer: Self-pay

## 2023-07-03 ENCOUNTER — Encounter: Payer: Self-pay | Admitting: Internal Medicine

## 2023-07-04 MED ORDER — TIRZEPATIDE-WEIGHT MANAGEMENT 2.5 MG/0.5ML ~~LOC~~ SOLN
2.5000 mg | SUBCUTANEOUS | 1 refills | Status: DC
Start: 2023-07-04 — End: 2023-07-29

## 2023-07-04 NOTE — Addendum Note (Signed)
 Addended byTrena Platt on: 07/04/2023 04:59 PM   Modules accepted: Orders

## 2023-07-29 ENCOUNTER — Encounter: Payer: Self-pay | Admitting: Internal Medicine

## 2023-07-29 ENCOUNTER — Other Ambulatory Visit: Payer: Self-pay | Admitting: Internal Medicine

## 2023-07-29 DIAGNOSIS — E66811 Obesity, class 1: Secondary | ICD-10-CM

## 2023-07-29 MED ORDER — TIRZEPATIDE-WEIGHT MANAGEMENT 5 MG/0.5ML ~~LOC~~ SOLN
5.0000 mg | SUBCUTANEOUS | 1 refills | Status: DC
Start: 2023-07-29 — End: 2023-08-21

## 2023-08-21 ENCOUNTER — Other Ambulatory Visit: Payer: Self-pay | Admitting: Internal Medicine

## 2023-08-21 ENCOUNTER — Encounter: Payer: Self-pay | Admitting: Internal Medicine

## 2023-08-21 DIAGNOSIS — E66811 Obesity, class 1: Secondary | ICD-10-CM

## 2023-08-21 MED ORDER — TIRZEPATIDE-WEIGHT MANAGEMENT 10 MG/0.5ML ~~LOC~~ SOLN: 10.0000 mg | SUBCUTANEOUS | 1 refills | Status: DC

## 2023-09-10 ENCOUNTER — Encounter: Payer: Self-pay | Admitting: Internal Medicine

## 2023-09-10 ENCOUNTER — Ambulatory Visit (INDEPENDENT_AMBULATORY_CARE_PROVIDER_SITE_OTHER): Payer: 59 | Admitting: Internal Medicine

## 2023-09-10 VITALS — BP 113/70 | HR 87 | Ht 66.0 in | Wt 180.2 lb

## 2023-09-10 DIAGNOSIS — R7303 Prediabetes: Secondary | ICD-10-CM

## 2023-09-10 DIAGNOSIS — F411 Generalized anxiety disorder: Secondary | ICD-10-CM

## 2023-09-10 DIAGNOSIS — E559 Vitamin D deficiency, unspecified: Secondary | ICD-10-CM

## 2023-09-10 DIAGNOSIS — E782 Mixed hyperlipidemia: Secondary | ICD-10-CM

## 2023-09-10 DIAGNOSIS — Z0001 Encounter for general adult medical examination with abnormal findings: Secondary | ICD-10-CM

## 2023-09-10 DIAGNOSIS — G43709 Chronic migraine without aura, not intractable, without status migrainosus: Secondary | ICD-10-CM

## 2023-09-10 DIAGNOSIS — E039 Hypothyroidism, unspecified: Secondary | ICD-10-CM

## 2023-09-10 DIAGNOSIS — E663 Overweight: Secondary | ICD-10-CM

## 2023-09-10 NOTE — Assessment & Plan Note (Signed)
 Well controlled with Emgality and as needed Imitrex Does not have a neurologist currently

## 2023-09-10 NOTE — Patient Instructions (Signed)
 Please continue to take medications as prescribed.  Please continue to follow low carb diet and perform moderate exercise/walking at least 150 mins/week.

## 2023-09-10 NOTE — Progress Notes (Signed)
 Established Patient Office Visit  Subjective:  Patient ID: Julia Rangel, female    DOB: [birth year]  Age: 38 y.o. MRN: [id number]  CC:  Chief Complaint  Patient presents with   Medical Management of Chronic Issues    3 month f/u     HPI Julia Rangel is a 38 y.o. female with past medical history of migraine, IDA, HLD and obesity who presents for f/u of her chronic medical conditions.  Migraine: Currently well controlled with Emgality  and as needed Imitrex .  She requires Imitrex  very rarely now.   She has history of obesity, for which she has been following low-carb diet and has continued regular exercise regimen for more than a year now.  She is tolerating Zepbound, gets it through [employer].  She has lost about 16 lbs with it in 2 months. She had been on Wegovy  in the past.  She had lost about 30 lbs with Wegovy . She lost coverage for Wegovy  in 04/24. She had started compounded semaglutide  injections through [facility] hormone clinic, but could not continue due to lack of efficacy.  She has chronic diarrhea, which has improved with Xifaxan .  She still takes Levsin as needed for abdominal cramping.  Her abdominal pain and cramping before and during bowel movement have also improved.  She also has rectal bleeding after bowel movement from hemorrhoids.  She takes Prozac  60 mg once daily for GAD.  Denies anhedonia, SI or HI currently.  Her thyroid  function tests have been wnl without NP thyroid  now.  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   Dysplasia of cervix    Family history of BRCA1 gene positive    Family history of breast cancer    History of cesarean delivery 10/09/2016   X2 plans rpeat   History of kidney stones    Menorrhagia    Migraines    since age 87 per patient   Patient desires pregnancy 06/01/2013   PMDD (premenstrual dysphoric disorder)    PROZAC  20 MG BUT NONE WITH PREGNANCY   PONV  (postoperative nausea and vomiting)    Vaginal Pap smear, abnormal     Past Surgical History:  Procedure Laterality Date   BICEPT TENODESIS Right 11/09/2021   Procedure: BICEPS TENODESIS;  Surgeon: Tonita Frater, MD;  Location: [facility];  Service: Orthopedics;  Laterality: Right;   BIOPSY N/A 05/04/2021   Procedure: BIOPSY;  Surgeon: Ruby Corporal, MD;  Location: [facility];  Service: Endoscopy;  Laterality: N/A;   BIOPSY  08/07/2022   Procedure: BIOPSY;  Surgeon: Urban Garden, MD;  Location: [facility];  Service: Gastroenterology;;   CESAREAN SECTION  2010   CESAREAN SECTION N/A 03/29/2014   Procedure: REPEAT CESAREAN SECTION;  Surgeon: Wendelyn Halter, MD;  Location: [facility];  Service: Obstetrics;  Laterality: N/A;   CESAREAN SECTION WITH BILATERAL TUBAL LIGATION Bilateral 05/08/2017   Procedure: CESAREAN SECTION WITH BILATERAL TUBAL LIGATION;  Surgeon: Albino Hum, MD;  Location: [facility];  Service: Obstetrics;  Laterality: Bilateral;   COLONOSCOPY N/A 05/04/2021   Procedure: COLONOSCOPY;  Surgeon: Ruby Corporal, MD;  Location: [facility];  Service: Endoscopy;  Laterality: N/A;  1245   COLONOSCOPY WITH PROPOFOL  N/A 08/07/2022   Procedure: COLONOSCOPY WITH PROPOFOL ;  Surgeon: Urban Garden, MD;  Location: [facility];  Service: Gastroenterology;  Laterality: N/A;  830am, asa 2  HYSTERECTOMY ABDOMINAL WITH SALPINGECTOMY N/A 09/15/2019   Procedure: HYSTERECTOMY ABDOMINAL and OPEN BILATERAL SALPINGECTOMY;  Surgeon: Albino Hum, MD;  Location: [facility];  Service: Gynecology;  Laterality: N/A;   KIDNEY STONE SURGERY  2010   stent   LEEP  2008   phylloid removed  2009   left breast   POLYPECTOMY  08/07/2022   Procedure: POLYPECTOMY INTESTINAL;  Surgeon: Urban Garden, MD;  Location: [facility];  Service: Gastroenterology;;   SHOULDER ARTHROSCOPY WITH DEBRIDEMENT AND BICEP TENDON REPAIR Right 11/09/2021   Procedure: SHOULDER  ARTHROSCOPY WITH DEBRIDEMENT OF CALCIFIC TENDONITIS AND BICEP TENDON REPAIR;  Surgeon: Tonita Frater, MD;  Location: [facility];  Service: Orthopedics;  Laterality: Right;   TONSILLECTOMY     WISDOM TOOTH EXTRACTION      Family History  Problem Relation Age of Onset   Diabetes Maternal Grandmother    Breast cancer Maternal Grandmother        dx 40s-50   Diabetes Paternal Grandmother    Breast cancer Paternal Grandmother 64   COPD Paternal Grandfather    Heart disease Maternal Grandfather    Breast cancer Mother 8   BRCA 1/2 Mother        BRCA1 pos   Ovarian cancer Mother    Other Mother        peritoneal cancer   Colon polyps Maternal Uncle    SIDS Maternal Aunt        2 babies died of SIDS at 24 months    Social History   Socioeconomic History   Marital status: Married    Spouse name: Not on file   Number of children: 3   Years of education: Not on file   Highest education level: Not on file  Occupational History   Not on file  Tobacco Use   Smoking status: Never   Smokeless tobacco: Never  Vaping Use   Vaping status: Never Used  Substance and Sexual Activity   Alcohol use: Yes    Alcohol/week: 7.0 standard drinks of alcohol    Types: 7 Glasses of wine per week   Drug use: No   Sexual activity: Not Currently    Birth control/protection: Surgical    Comment: tubal,hysterectomy  Other Topics Concern   Not on file  Social History Narrative   Married for 6 years.Lives with husband and 3 kids.Teacher at [employer].   Social Drivers of Corporate investment banker Strain: Not on file  Food Insecurity: Not on file  Transportation Needs: Not on file  Physical Activity: Not on file  Stress: Not on file  Social Connections: Not on file  Intimate Partner Violence: Not on file    Outpatient Medications Prior to Visit  Medication Sig Dispense Refill   Bacillus Coagulans-Inulin (PROBIOTIC-PREBIOTIC PO) Take 2 tablets by mouth in the morning. Lemme  Debloat - Digestive & Gut Health Gummies with Probiotics & Prebiotic     EMGALITY  120 MG/ML SOAJ INJECT 1 ML SUBCUTANEOUSLY  ONCE EVERY MONTH 1 mL 4   FLUoxetine  (PROZAC ) 20 MG tablet Take 1 tablet (20 mg total) by mouth daily. 30 tablet 11   FLUoxetine  (PROZAC ) 40 MG capsule Take 1 capsule (40 mg total) by mouth at bedtime. 40 mg + 20 mg=60 mg daily 45 capsule 6   hydrocortisone  2.5 % cream Apply 1 Application topically 2 (two) times daily as needed (skin irritation.).     hyoscyamine  (LEVSIN) 0.125 MG tablet Take 1 tablet (0.125 mg  total) by mouth every 8 (eight) hours as needed. 30 tablet 0   MELATONIN-THEANINE PO Take 2 tablets by mouth at bedtime. Lemme Sleep Gummies with 5mg  Melatonin, Elderberry, Magnesium, L-Theanine, Chamomile and Lavender     ondansetron  (ZOFRAN ) 4 MG tablet Take 4 mg by mouth every 8 (eight) hours as needed for nausea or vomiting.     SUMAtriptan  (IMITREX ) 100 MG tablet Take 100 mg by mouth every 2 (two) hours as needed for migraine. May repeat in 2 hours if headache persists or recurs.     tirzepatide 10 MG/0.5ML injection vial Inject 10 mg into the skin once a week. 2 mL 1   tretinoin (RETIN-A) 0.025 % cream Apply 1 Application topically in the morning.     phentermine  (ADIPEX-P ) 37.5 MG tablet Take 1 tablet (37.5 mg total) by mouth daily before breakfast. 30 tablet 2   No facility-administered medications prior to visit.    No Known Allergies  ROS Review of Systems  Constitutional:  Negative for chills and fever.  HENT:  Negative for congestion, sinus pressure, sinus pain and sore throat.   Eyes:  Negative for pain and discharge.  Respiratory:  Negative for cough and shortness of breath.   Cardiovascular:  Negative for chest pain and palpitations.  Gastrointestinal:  Negative for abdominal pain, diarrhea, nausea and vomiting.  Endocrine: Negative for polydipsia and polyuria.  Genitourinary:  Negative for dysuria and hematuria.  Musculoskeletal:  Negative for  neck pain and neck stiffness.  Skin:  Negative for rash.  Neurological:  Positive for tremors. Negative for dizziness and weakness.  Psychiatric/Behavioral:  Negative for agitation and behavioral problems.       Objective:    Physical Exam Vitals reviewed.  Constitutional:      General: She is not in acute distress.    Appearance: She is obese. She is not diaphoretic.  HENT:     Head: Normocephalic and atraumatic.     Nose: Nose normal.     Mouth/Throat:     Mouth: Mucous membranes are moist.  Eyes:     General: No scleral icterus.    Extraocular Movements: Extraocular movements intact.  Cardiovascular:     Rate and Rhythm: Normal rate and regular rhythm.     Heart sounds: Normal heart sounds. No murmur heard. Pulmonary:     Breath sounds: Normal breath sounds. No wheezing or rales.  Abdominal:     Palpations: Abdomen is soft.     Tenderness: There is no abdominal tenderness.  Musculoskeletal:     Cervical back: Neck supple. No tenderness.     Right lower leg: No edema.     Left lower leg: No edema.  Skin:    General: Skin is warm.     Findings: No rash.  Neurological:     General: No focal deficit present.     Mental Status: She is alert and oriented to person, place, and time.  Psychiatric:        Mood and Affect: Mood normal.        Behavior: Behavior normal.     BP 113/70   Pulse 87   Ht 5\' 6"  (1.676 m)   Wt 180 lb 3.2 oz (81.7 kg)   LMP 07/14/2019 (Approximate)   SpO2 98%   BMI 29.09 kg/m  Wt Readings from Last 3 Encounters:  09/10/23 180 lb 3.2 oz (81.7 kg)  06/05/23 190 lb (86.2 kg)  12/03/22 166 lb (75.3 kg)    Lab Results  Component  Value Date   TSH 1.990 07/27/2022   Lab Results  Component Value Date   WBC 6.4 11/02/2022   HGB 13.2 11/02/2022   HCT 38.1 11/02/2022   MCV 93.2 11/02/2022   PLT 279 11/02/2022   Lab Results  Component Value Date   NA 135 11/02/2022   K 3.8 11/02/2022   CO2 22 11/02/2022   GLUCOSE 99 11/02/2022   BUN  21 (H) 11/02/2022   CREATININE 0.99 11/02/2022   BILITOT 0.6 11/02/2022   ALKPHOS 53 11/02/2022   AST 18 11/02/2022   ALT 17 11/02/2022   PROT 7.9 11/02/2022   ALBUMIN  4.4 11/02/2022   CALCIUM 9.5 11/02/2022   ANIONGAP 11 11/02/2022   EGFR 110 07/27/2022   Lab Results  Component Value Date   CHOL 175 07/27/2022   Lab Results  Component Value Date   HDL 62 07/27/2022   Lab Results  Component Value Date   LDLCALC 90 07/27/2022   Lab Results  Component Value Date   TRIG 129 07/27/2022   Lab Results  Component Value Date   CHOLHDL 2.8 07/27/2022   Lab Results  Component Value Date   HGBA1C 4.8 07/27/2022      Assessment & Plan:   Problem List Items Addressed This Visit       Cardiovascular and Mediastinum   Migraine (Chronic)   Well controlled with Emgality  and as needed Imitrex  Does not have a neurologist currently      Relevant Orders   CMP14+EGFR   CBC with Differential/Platelet     Endocrine   Hypothyroidism   Lab Results  Component Value Date   TSH 1.990 07/27/2022   Chart review does not show any history of elevated TSH or low free T4 Was on NP thyroid  60 mg daily, appeared to be oversupplemented, tapered NP thyroid  TSH and free T4 WNL now      Relevant Orders   TSH     Other   Overweight   BMI Readings from Last 3 Encounters:  09/10/23 29.09 kg/m  06/05/23 30.67 kg/m  12/03/22 26.79 kg/m    She has been following low-carb diet and performs moderate exercise/walking for more than a year now Was on Wegovy  - initial BMI was 33.93 - had excellent response, but had to stop due to insurance coverage concern Continue Zepbound through [employer], currently at 5 mg dose (takes 5 mg dose from 10 mg vial qw) - initial BMI: 30.67 She is encouraged to continue to follow low-carb diet and engage in physical exercise/walking at least 150 minutes/week  Check CMP      Mixed hyperlipidemia   Last lipid profile reviewed - improved with  weight loss Continue to follow low-carb diet      Relevant Orders   Lipid panel   Encounter for general adult medical examination with abnormal findings - Primary   Physical exam as documented. Fasting blood tests ordered today.      GAD (generalized anxiety disorder)   Well controlled with Prozac  60 mg QD      Other Visit Diagnoses       Vitamin D  deficiency       Relevant Orders   VITAMIN D  25 Hydroxy (Vit-D Deficiency, Fractures)     Prediabetes       Relevant Orders   Hemoglobin A1c   CMP14+EGFR          No orders of the defined types were placed in this encounter.   Follow-up: Return in about  4 months (around 01/11/2024) for Weight management.    Meldon Sport, MD

## 2023-09-10 NOTE — Assessment & Plan Note (Signed)
Last lipid profile reviewed - improved with weight loss Continue to follow low-carb diet

## 2023-09-10 NOTE — Assessment & Plan Note (Addendum)
 BMI Readings from Last 3 Encounters:  09/10/23 29.09 kg/m  06/05/23 30.67 kg/m  12/03/22 26.79 kg/m    She has been following low-carb diet and performs moderate exercise/walking for more than a year now Was on Wegovy  - initial BMI was 33.93 - had excellent response, but had to stop due to insurance coverage concern Continue Zepbound through [employer], currently at 5 mg dose (takes 5 mg dose from 10 mg vial qw) - initial BMI: 30.67 She is encouraged to continue to follow low-carb diet and engage in physical exercise/walking at least 150 minutes/week  Check CMP

## 2023-09-10 NOTE — Assessment & Plan Note (Signed)
Physical exam as documented. ?Fasting blood tests ordered today. ?

## 2023-09-10 NOTE — Assessment & Plan Note (Signed)
 Lab Results  Component Value Date   TSH 1.990 07/27/2022   Chart review does not show any history of elevated TSH or low free T4 Was on NP thyroid 60 mg daily, appeared to be oversupplemented, tapered NP thyroid TSH and free T4 WNL now

## 2023-09-10 NOTE — Assessment & Plan Note (Signed)
 Well controlled with Prozac  60 mg QD

## 2023-09-13 ENCOUNTER — Encounter: Payer: Self-pay | Admitting: Internal Medicine

## 2023-09-13 LAB — CMP14+EGFR
ALT: 20 IU/L (ref 0–32)
AST: 20 IU/L (ref 0–40)
Albumin: 4.2 g/dL (ref 3.9–4.9)
Alkaline Phosphatase: 60 IU/L (ref 44–121)
BUN/Creatinine Ratio: 15 (ref 9–23)
BUN: 12 mg/dL (ref 6–20)
Bilirubin Total: 0.4 mg/dL (ref 0.0–1.2)
CO2: 21 mmol/L (ref 20–29)
Calcium: 9 mg/dL (ref 8.7–10.2)
Chloride: 104 mmol/L (ref 96–106)
Creatinine, Ser: 0.78 mg/dL (ref 0.57–1.00)
Globulin, Total: 2.4 g/dL (ref 1.5–4.5)
Glucose: 74 mg/dL (ref 70–99)
Potassium: 4.4 mmol/L (ref 3.5–5.2)
Sodium: 142 mmol/L (ref 134–144)
Total Protein: 6.6 g/dL (ref 6.0–8.5)
eGFR: 99 mL/min/{1.73_m2} (ref >59)

## 2023-09-13 LAB — CBC WITH DIFFERENTIAL/PLATELET
Basophils Absolute: 0 10*3/uL (ref 0.0–0.2)
Basos: 1 %
EOS (ABSOLUTE): 0.1 10*3/uL (ref 0.0–0.4)
Eos: 2 %
Hematocrit: 35.5 % (ref 34.0–46.6)
Hemoglobin: 12.2 g/dL (ref 11.1–15.9)
Immature Grans (Abs): 0 10*3/uL (ref 0.0–0.1)
Immature Granulocytes: 0 %
Lymphocytes Absolute: 1.1 10*3/uL (ref 0.7–3.1)
Lymphs: 26 %
MCH: 31.8 pg (ref 26.6–33.0)
MCHC: 34.4 g/dL (ref 31.5–35.7)
MCV: 92 fL (ref 79–97)
Monocytes Absolute: 0.3 10*3/uL (ref 0.1–0.9)
Monocytes: 7 %
Neutrophils Absolute: 2.8 10*3/uL (ref 1.4–7.0)
Neutrophils: 64 %
Platelets: 268 10*3/uL (ref 150–450)
RBC: 3.84 x10E6/uL (ref 3.77–5.28)
RDW: 11.4 % — ABNORMAL LOW (ref 11.7–15.4)
WBC: 4.3 10*3/uL (ref 3.4–10.8)

## 2023-09-13 LAB — LIPID PANEL
Chol/HDL Ratio: 3.4 ratio (ref 0.0–4.4)
Cholesterol, Total: 151 mg/dL (ref 100–199)
HDL: 45 mg/dL (ref >39)
LDL Chol Calc (NIH): 89 mg/dL (ref 0–99)
Triglycerides: 88 mg/dL (ref 0–149)
VLDL Cholesterol Cal: 17 mg/dL (ref 5–40)

## 2023-09-13 LAB — HEMOGLOBIN A1C
Est. average glucose Bld gHb Est-mCnc: 97 mg/dL
Hgb A1c MFr Bld: 5 % (ref 4.8–5.6)

## 2023-09-13 LAB — VITAMIN D 25 HYDROXY (VIT D DEFICIENCY, FRACTURES): Vit D, 25-Hydroxy: 65 ng/mL (ref 30.0–100.0)

## 2023-09-13 LAB — TSH: TSH: 1.82 u[IU]/mL (ref 0.450–4.500)

## 2023-09-26 ENCOUNTER — Other Ambulatory Visit: Payer: Self-pay

## 2023-09-26 ENCOUNTER — Encounter: Payer: Self-pay | Admitting: Internal Medicine

## 2023-09-26 DIAGNOSIS — G43809 Other migraine, not intractable, without status migrainosus: Secondary | ICD-10-CM

## 2023-09-26 MED ORDER — EMGALITY 120 MG/ML ~~LOC~~ SOAJ
SUBCUTANEOUS | 4 refills | Status: DC
Start: 2023-09-26 — End: 2024-02-10

## 2023-10-13 IMAGING — MR MR SHOULDER*R* W/O CM
5 series · 40 of 40 positions shown · non-contrast
Comparison: Radiograph 08/17/2021

CLINICAL DATA: Shoulder pain, rotator cuff disorder suspected, xray
done

EXAM:
MRI OF THE RIGHT SHOULDER WITHOUT CONTRAST
TECHNIQUE: Multiplanar, multisequence MR imaging of the shoulder was performed.
No intravenous contrast was administered.

[Series 2: T2 fat-sat · axial · right · 4.0mm · 0.49mm/px · z∈[+8,+97]mm · 8 of 20 slices shown (1 of 3)]
[im 1/20]
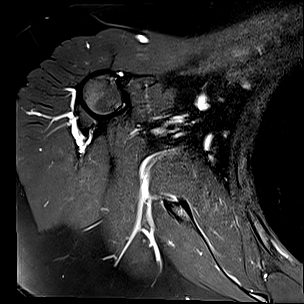
[im 3/20]
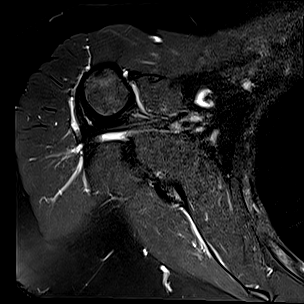
[im 6/20]
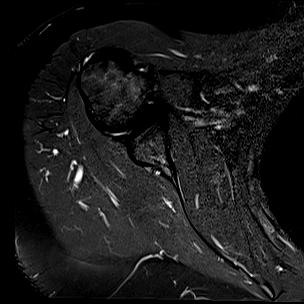
[im 9/20]
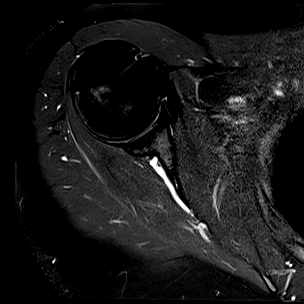
[im 11/20]
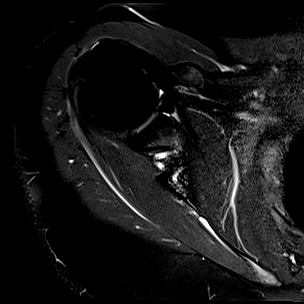
[im 14/20]
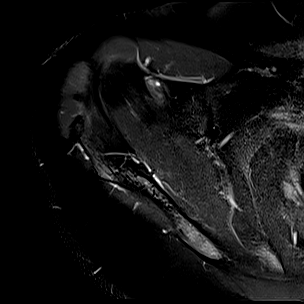
[im 17/20]
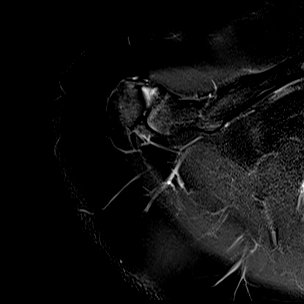
[im 20/20]
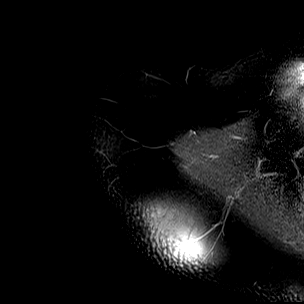

[Series 3: T1 · oblique · right · 4.0mm · 0.41mm/px · 8 of 19 slices shown]
[im 1/19]
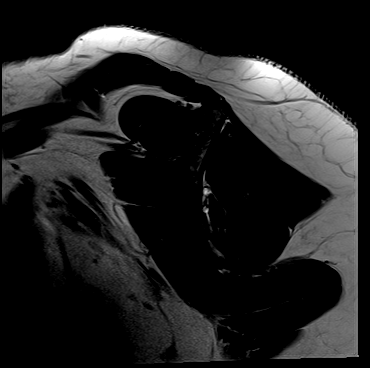
[im 3/19]
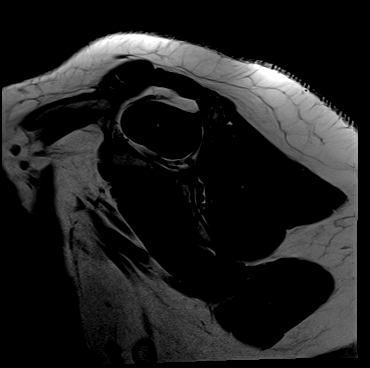
[im 6/19]
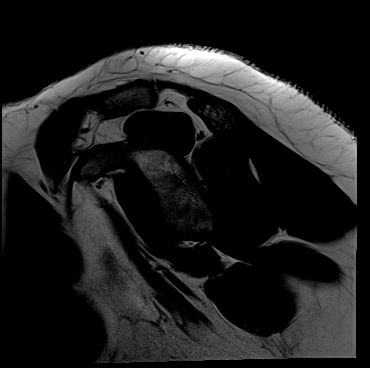
[im 8/19]
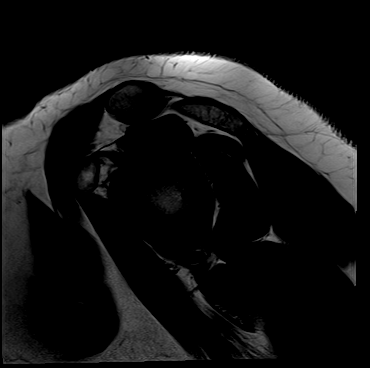
[im 11/19]
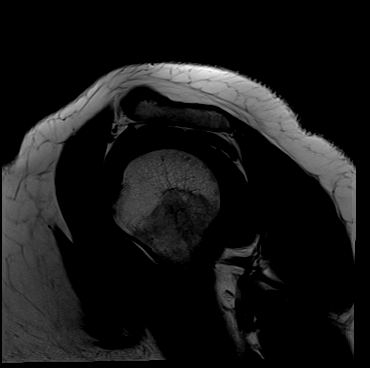
[im 13/19]
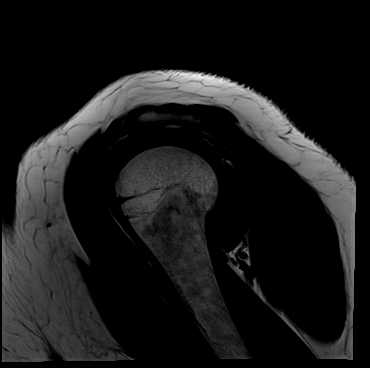
[im 16/19]
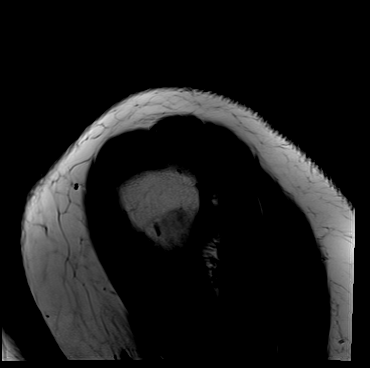
[im 19/19]
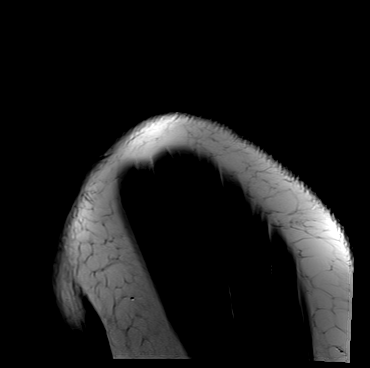

[Series 4: T2 fat-sat · oblique · right · 4.0mm · 0.47mm/px · 8 of 19 slices shown (2 of 3)]
[im 1/19]
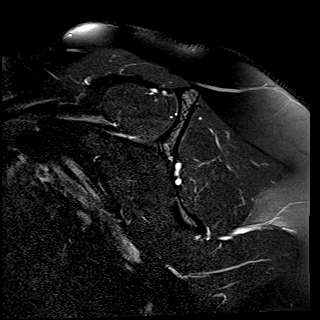
[im 3/19]
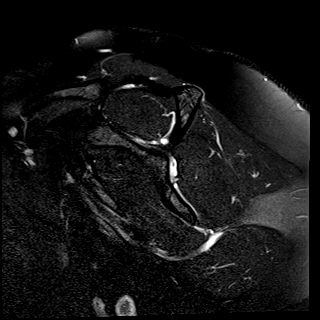
[im 6/19]
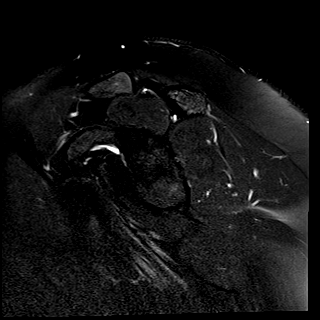
[im 8/19]
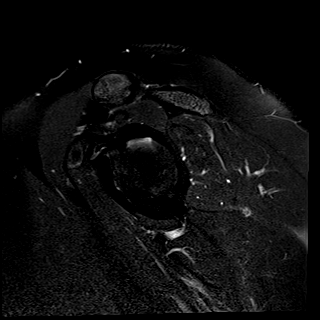
[im 11/19]
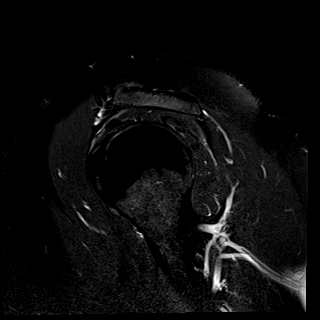
[im 13/19]
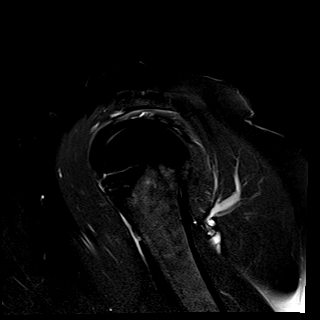
[im 16/19]
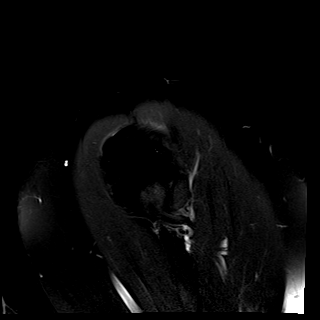
[im 19/19]
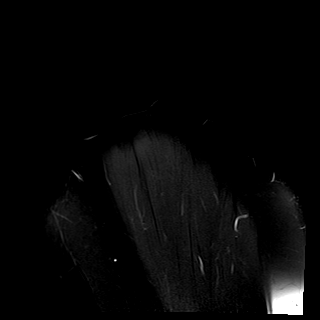

[Series 5: T2 fat-sat · oblique · right · 4.0mm · 0.47mm/px · 8 of 19 slices shown (3 of 3)]
[im 1/19]
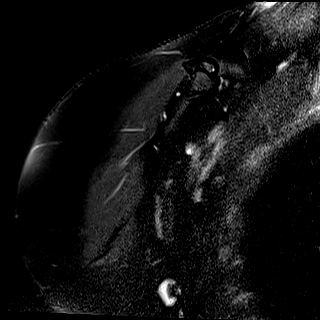
[im 3/19]
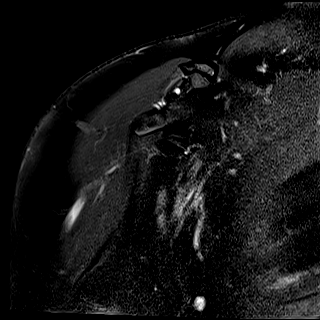
[im 6/19]
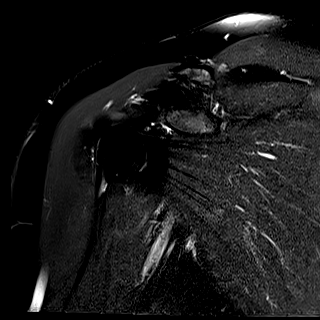
[im 8/19]
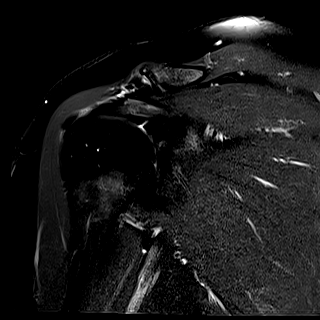
[im 11/19]
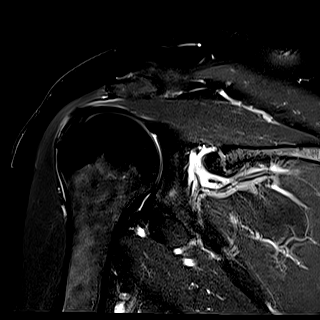
[im 13/19]
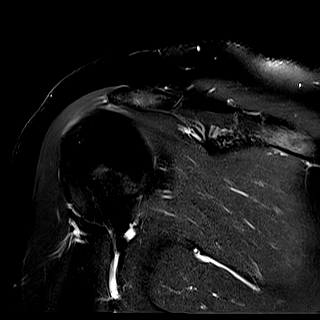
[im 16/19]
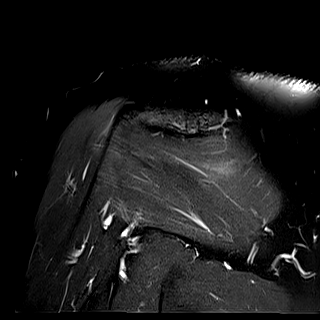
[im 19/19]
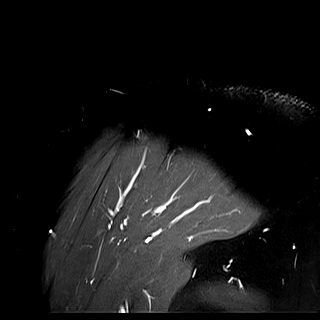

[Series 6: PD · oblique · right · 4.0mm · 0.43mm/px · 8 of 19 slices shown]
[im 1/19]
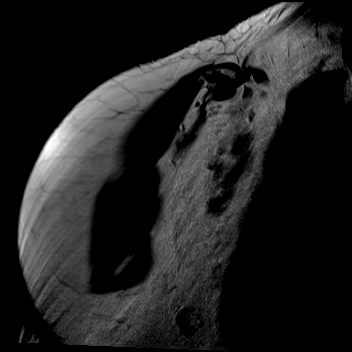
[im 3/19]
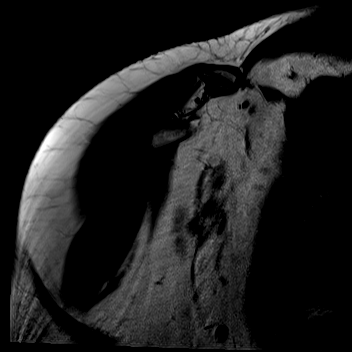
[im 6/19]
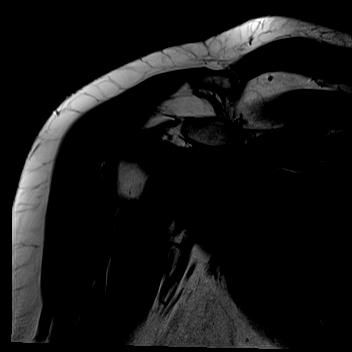
[im 8/19]
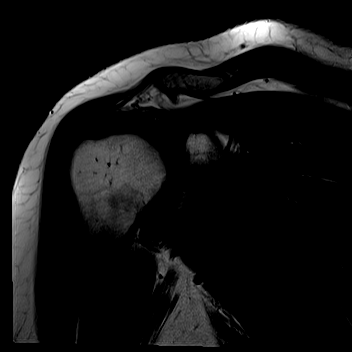
[im 11/19]
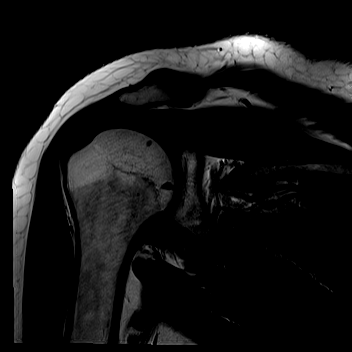
[im 13/19]
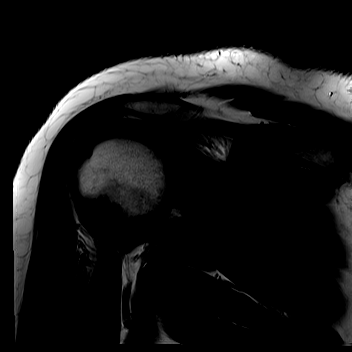
[im 16/19]
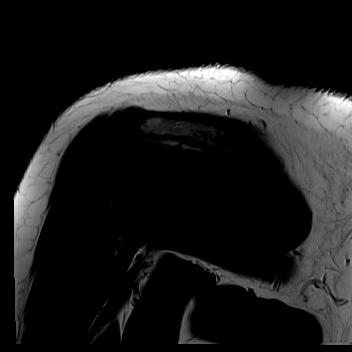
[im 19/19]
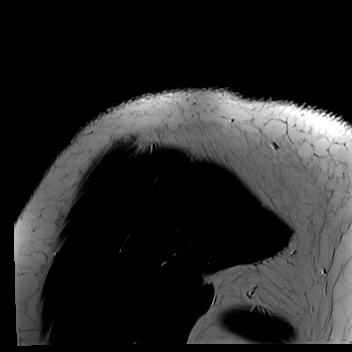

[40 of 40 positions shown; findings below may reference images not displayed]

FINDINGS: Rotator cuff: Globular 1.1 cm low signal intensity along the bursal
surface of the supraspinatus tendon at the footprint (coronal T2
image 11), consistent with calcific tendinosis. There is adjacent
focal bursal sided fraying. Infraspinatus tendon is intact. Teres
minor tendon is intact. Subscapularis tendon is intact.

Muscles: No significant muscle atrophy.

Biceps Long Head: Intraarticular and extraarticular portions of the
biceps tendon are intact.

Acromioclavicular Joint: No significant arthropathy of the
acromioclavicular joint. Minimal amount of subacromial/subdeltoid
bursal fluid.

Glenohumeral Joint: No significant joint effusion. No chondral
defect.

Labrum: Grossly intact, but evaluation is limited by lack of
intraarticular fluid/contrast.

Bones: No fracture or dislocation. No aggressive osseous lesion.

Other: No fluid collection or hematoma.
IMPRESSION: Calcific tendinosis of the supraspinatus tendon with adjacent bursal
sided fraying at the footprint and mild subacromial-subdeltoid
bursitis. No high-grade or retracted cuff tear. No significant
muscle atrophy.

## 2023-11-15 ENCOUNTER — Other Ambulatory Visit: Payer: Self-pay | Admitting: Obstetrics & Gynecology

## 2023-11-21 ENCOUNTER — Other Ambulatory Visit: Payer: Self-pay

## 2023-11-21 ENCOUNTER — Encounter: Payer: Self-pay | Admitting: Internal Medicine

## 2023-11-21 DIAGNOSIS — F411 Generalized anxiety disorder: Secondary | ICD-10-CM

## 2023-11-21 MED ORDER — FLUOXETINE HCL 20 MG PO TABS: 20.0000 mg | ORAL_TABLET | Freq: Every day | ORAL | 1 refills | Status: DC

## 2023-11-21 MED ORDER — FLUOXETINE HCL 40 MG PO CAPS: 40.0000 mg | ORAL_CAPSULE | Freq: Every day | ORAL | 1 refills | Status: DC

## 2023-12-17 ENCOUNTER — Encounter: Payer: Self-pay | Admitting: Internal Medicine

## 2023-12-27 ENCOUNTER — Encounter: Payer: Self-pay | Admitting: Radiology

## 2024-01-09 ENCOUNTER — Encounter: Payer: Self-pay | Admitting: Internal Medicine

## 2024-01-09 ENCOUNTER — Ambulatory Visit (INDEPENDENT_AMBULATORY_CARE_PROVIDER_SITE_OTHER): Admitting: Internal Medicine

## 2024-01-09 VITALS — BP 103/72 | HR 80 | Ht 66.0 in | Wt 176.8 lb

## 2024-01-09 DIAGNOSIS — F4320 Adjustment disorder, unspecified: Secondary | ICD-10-CM | POA: Diagnosis not present

## 2024-01-09 DIAGNOSIS — F411 Generalized anxiety disorder: Secondary | ICD-10-CM

## 2024-01-09 DIAGNOSIS — F5101 Primary insomnia: Secondary | ICD-10-CM | POA: Diagnosis not present

## 2024-01-09 DIAGNOSIS — Z23 Encounter for immunization: Secondary | ICD-10-CM | POA: Diagnosis not present

## 2024-01-09 DIAGNOSIS — E663 Overweight: Secondary | ICD-10-CM

## 2024-01-09 MED ORDER — HYDROXYZINE PAMOATE 25 MG PO CAPS
25.0000 mg | ORAL_CAPSULE | Freq: Every evening | ORAL | 0 refills | Status: DC | PRN
Start: 2024-01-09 — End: 2024-03-02

## 2024-01-09 NOTE — Patient Instructions (Signed)
 Please start taking Hydroxyzine  as needed for insomnia.  Please continue to take medications as prescribed.  Please continue to follow low carb diet and perform moderate exercise/walking at least 150 mins/week.

## 2024-01-09 NOTE — Progress Notes (Addendum)
 "  Established Patient Office Visit  Subjective:  Patient ID: Julia Rangel, female    DOB: August 31, 1985  Age: 38 y.o. MRN: [id number]  CC:  Chief Complaint  Patient presents with   Obesity    Weight loss f/u     HPI Julia Rangel is a 38 y.o. female with past medical history of migraine, IDA, HLD and obesity who presents for f/u of her chronic medical conditions.  Migraine: Currently well controlled with Emgality  and as needed Imitrex .  She requires Imitrex  very rarely now.   She has history of obesity, for which she has been following low-carb diet and has continued regular exercise regimen for more than a year now.  She is tolerating Zepbound , gets it through [facility].  She has lost about 20 lbs with it in 6 months. She had been on Wegovy  in the past.  She had lost about 30 lbs with Wegovy . She lost coverage for Wegovy  in 04/24. She had started compounded semaglutide  injections through [facility], but could not continue due to lack of efficacy.  She has chronic diarrhea, which has improved with Xifaxan .  She still takes Levsin  as needed for abdominal cramping.  Her abdominal pain and cramping before and during bowel movement have also improved.  She also had rectal bleeding after bowel movement from hemorrhoids, but has improved now.  She takes Prozac  60 mg once daily for GAD.  Denies anhedonia, SI or HI currently.  She recently lost her mother due to metastatic ovarian cancer  about a month ago.  She has been feeling low, lack of motivation activities and has insomnia.  She still tries to keep herself motivated and has continued to work as a psychologist, forensic.  Denies delusions, hallucinations, SI or HI currently.  Her thyroid  function tests have been wnl without NP thyroid  now.  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   Dysplasia of cervix    Family history of BRCA1 gene positive    Family history of  breast cancer    History of cesarean delivery 10/09/2016   X2 plans rpeat   History of kidney stones    Menorrhagia    Migraines    since age 32 per patient   Patient desires pregnancy 06/01/2013   PMDD (premenstrual dysphoric disorder)    PROZAC  20 MG BUT NONE WITH PREGNANCY   PONV (postoperative nausea and vomiting)    Vaginal Pap smear, abnormal     Past Surgical History:  Procedure Laterality Date   BICEPT TENODESIS Right 11/09/2021   Procedure: BICEPS TENODESIS;  Surgeon: Onesimo Oneil LABOR, MD;  Location: [facility];  Service: Orthopedics;  Laterality: Right;   BIOPSY N/A 05/04/2021   Procedure: BIOPSY;  Surgeon: Golda Claudis PENNER, MD;  [facility];  Service: Endoscopy;  Laterality: N/A;   BIOPSY  08/07/2022   Procedure: BIOPSY;  Surgeon: Eartha Angelia Sieving, MD;  [facility];  Service: Gastroenterology;;   CESAREAN SECTION  2010   CESAREAN SECTION N/A 03/29/2014   Procedure: REPEAT CESAREAN SECTION;  Surgeon: Vonn VEAR Inch, MD;  Location: [facility];  Service: Obstetrics;  Laterality: N/A;   CESAREAN SECTION WITH BILATERAL TUBAL LIGATION Bilateral 05/08/2017   Procedure: CESAREAN SECTION WITH BILATERAL TUBAL LIGATION;  Surgeon: Edsel Norleen GAILS, MD;  Location: [facility];  Service: Obstetrics;  Laterality: Bilateral;   COLONOSCOPY N/A 05/04/2021   Procedure:  COLONOSCOPY;  Surgeon: Golda Claudis PENNER, MD;  [facility];  Service: Endoscopy;  Laterality: N/A;  1245   COLONOSCOPY WITH PROPOFOL  N/A 08/07/2022   Procedure: COLONOSCOPY WITH PROPOFOL ;  Surgeon: Eartha Angelia Sieving, MD;  [facility];  Service: Gastroenterology;  Laterality: N/A;  830am, asa 2   HYSTERECTOMY ABDOMINAL WITH SALPINGECTOMY N/A 09/15/2019   Procedure: HYSTERECTOMY ABDOMINAL and OPEN BILATERAL SALPINGECTOMY;  Surgeon: Edsel Norleen GAILS, MD;  Location: [facility];  Service: Gynecology;  Laterality: N/A;   KIDNEY STONE SURGERY  2010   stent   LEEP  2008   phylloid  removed  2009   left breast   POLYPECTOMY  08/07/2022   Procedure: POLYPECTOMY INTESTINAL;  Surgeon: Eartha Angelia Sieving, MD;  [facility];  Service: Gastroenterology;;   SHOULDER ARTHROSCOPY WITH DEBRIDEMENT AND BICEP TENDON REPAIR Right 11/09/2021   Procedure: SHOULDER ARTHROSCOPY WITH DEBRIDEMENT OF CALCIFIC TENDONITIS AND BICEP TENDON REPAIR;  Surgeon: Onesimo Oneil LABOR, MD;  Location: [facility];  Service: Orthopedics;  Laterality: Right;   TONSILLECTOMY     WISDOM TOOTH EXTRACTION      Family History  Problem Relation Age of Onset   Diabetes Maternal Grandmother    Breast cancer Maternal Grandmother        dx 40s-50   Diabetes Paternal Grandmother    Breast cancer Paternal Grandmother 32   COPD Paternal Grandfather    Heart disease Maternal Grandfather    Breast cancer Mother 59   BRCA 1/2 Mother        BRCA1 pos   Ovarian cancer Mother    Other Mother        peritoneal cancer   Colon polyps Maternal Uncle    SIDS Maternal Aunt        2 babies died of SIDS at 40 months    Social History   Socioeconomic History   Marital status: Married    Spouse name: Not on file   Number of children: 3   Years of education: Not on file   Highest education level: Not on file  Occupational History   Not on file  Tobacco Use   Smoking status: Never   Smokeless tobacco: Never  Vaping Use   Vaping status: Never Used  Substance and Sexual Activity   Alcohol use: Yes    Alcohol/week: 7.0 standard drinks of alcohol    Types: 7 Glasses of wine per week   Drug use: No   Sexual activity: Not Currently    Birth control/protection: Surgical    Comment: tubal,hysterectomy  Other Topics Concern   Not on file  Social History Narrative   Married for 6 years.Lives with husband and 3 kids.Teacher at [employer].   Social Drivers of Health   Tobacco Use: Low Risk (05/14/2024)   Patient History    Smoking Tobacco Use: Never    Smokeless Tobacco Use: Never     Passive Exposure: Not on file  Financial Resource Strain: Not on file  Food Insecurity: Not on file  Transportation Needs: Not on file  Physical Activity: Not on file  Stress: Not on file  Social Connections: Not on file  Intimate Partner Violence: Not on file  Depression (PHQ2-9): High Risk (01/09/2024)   Depression (PHQ2-9)    PHQ-2 Score: 14  Alcohol Screen: Not on file  Housing: Not on file  Utilities: Not on file  Health Literacy: Not on file    Outpatient Medications Prior to Visit  Medication  Sig Dispense Refill   Bacillus Coagulans-Inulin (PROBIOTIC-PREBIOTIC PO) Take 2 tablets by mouth in the morning. Lemme Debloat - Digest[facility] Gummies with Probiotics & Prebiotic     hydrocortisone  2.5 % cream Apply 1 Application topically 2 (two) times daily as needed (skin irritation.).     hyoscyamine  (LEVSIN ) 0.125 MG tablet Take 1 tablet (0.125 mg total) by mouth every 8 (eight) hours as needed. 30 tablet 0   MELATONIN-THEANINE PO Take 2 tablets by mouth at bedtime. Lemme Sleep Gummies with 5mg  Melatonin, Elderberry, Magnesium, L-Theanine, Chamomile and Lavender     ondansetron  (ZOFRAN ) 4 MG tablet Take 4 mg by mouth every 8 (eight) hours as needed for nausea or vomiting.     SUMAtriptan  (IMITREX ) 100 MG tablet Take 100 mg by mouth every 2 (two) hours as needed for migraine. May repeat in 2 hours if headache persists or recurs.     tretinoin (RETIN-A) 0.025 % cream Apply 1 Application topically in the morning.     EMGALITY  120 MG/ML SOAJ INJECT 1 ML SUBCUTANEOUSLY  ONCE EVERY MONTH 1 mL 4   FLUoxetine  (PROZAC ) 20 MG tablet Take 1 tablet (20 mg total) by mouth at bedtime. Take it with 40 mg dose, for a total of 60 mg. 90 tablet 1   FLUoxetine  (PROZAC ) 40 MG capsule TAKE 1 CAPSULE BY MOUTH AT BEDTIME (TOTAL DAILY DOSE IS 60 MG) 45 capsule 0   FLUoxetine  (PROZAC ) 40 MG capsule Take 1 capsule (40 mg total) by mouth at bedtime. 40 mg + 20 mg=60 mg daily 90 capsule 1   tirzepatide  10  MG/0.5ML injection vial Inject 10 mg into the skin once a week. 2 mL 1   No facility-administered medications prior to visit.    No Known Allergies  ROS Review of Systems  Constitutional:  Negative for chills and fever.  HENT:  Negative for congestion, sinus pressure, sinus pain and sore throat.   Eyes:  Negative for pain and discharge.  Respiratory:  Negative for cough and shortness of breath.   Cardiovascular:  Negative for chest pain and palpitations.  Gastrointestinal:  Negative for abdominal pain, diarrhea, nausea and vomiting.  Endocrine: Negative for polydipsia and polyuria.  Genitourinary:  Negative for dysuria and hematuria.  Musculoskeletal:  Negative for neck pain and neck stiffness.  Skin:  Negative for rash.  Neurological:  Positive for tremors. Negative for dizziness and weakness.  Psychiatric/Behavioral:  Positive for dysphoric mood and sleep disturbance. Negative for agitation and behavioral problems. The patient is nervous/anxious.       Objective:    Physical Exam Vitals reviewed.  Constitutional:      General: She is not in acute distress.    Appearance: She is obese. She is not diaphoretic.  HENT:     Head: Normocephalic and atraumatic.     Nose: Nose normal.     Mouth/Throat:     Mouth: Mucous membranes are moist.  Eyes:     General: No scleral icterus.    Extraocular Movements: Extraocular movements intact.  Cardiovascular:     Rate and Rhythm: Normal rate and regular rhythm.     Heart sounds: Normal heart sounds. No murmur heard. Pulmonary:     Breath sounds: Normal breath sounds. No wheezing or rales.  Musculoskeletal:     Cervical back: Neck supple. No tenderness.     Right lower leg: No edema.     Left lower leg: No edema.  Skin:    General: Skin is warm.  Findings: No rash.  Neurological:     General: No focal deficit present.     Mental Status: She is alert and oriented to person, place, and time.  Psychiatric:        Mood and  Affect: Mood normal.        Behavior: Behavior normal.     BP 103/72   Pulse 80   Ht 5' 6 (1.676 m)   Wt 176 lb 12.8 oz (80.2 kg)   LMP 07/14/2019   SpO2 99%   BMI 28.54 kg/m  Wt Readings from Last 3 Encounters:  05/14/24 178 lb (80.7 kg)  01/09/24 176 lb 12.8 oz (80.2 kg)  09/10/23 180 lb 3.2 oz (81.7 kg)    Lab Results  Component Value Date   TSH 1.820 09/11/2023   Lab Results  Component Value Date   WBC 4.3 09/11/2023   HGB 12.2 09/11/2023   HCT 35.5 09/11/2023   MCV 92 09/11/2023   PLT 268 09/11/2023   Lab Results  Component Value Date   NA 136 05/13/2024   K 4.6 05/13/2024   CO2 24 05/13/2024   GLUCOSE 95 05/13/2024   BUN 20 05/13/2024   CREATININE 1.06 (H) 05/13/2024   BILITOT <0.2 05/13/2024   ALKPHOS 70 05/13/2024   AST 15 05/13/2024   ALT 17 05/13/2024   PROT 7.1 05/13/2024   ALBUMIN  4.7 05/13/2024   CALCIUM 9.4 05/13/2024   ANIONGAP 11 11/02/2022   EGFR 69 05/13/2024   Lab Results  Component Value Date   CHOL 151 09/11/2023   Lab Results  Component Value Date   HDL 45 09/11/2023   Lab Results  Component Value Date   LDLCALC 89 09/11/2023   Lab Results  Component Value Date   TRIG 88 09/11/2023   Lab Results  Component Value Date   CHOLHDL 3.4 09/11/2023   Lab Results  Component Value Date   HGBA1C 5.0 09/11/2023      Assessment & Plan:   Problem List Items Addressed This Visit       Other   Overweight - Primary   BMI Readings from Last 3 Encounters:  01/09/24 28.54 kg/m  09/10/23 29.09 kg/m  06/05/23 30.67 kg/m    She has been following low-carb diet and performs moderate exercise/walking for more than a year now Was on Wegovy  - initial BMI was 33.93 - had excellent response, but had to stop due to insurance coverage concern Continue Zepbound  through [employer], currently at 5 mg dose (takes 5 mg dose from 10 mg vial qw) - initial BMI: 30.67 She is encouraged to continue to follow low-carb diet and  engage in physical exercise/walking at least 150 minutes/week  Check CMP      Relevant Orders   CMP14+EGFR (Completed)   GAD (generalized anxiety disorder)   Currently worse due to grief reaction Usually well controlled with Prozac  60 mg QD      Relevant Orders   CMP14+EGFR (Completed)   Primary insomnia   Recent grief reaction worsening her insomnia Vistaril  as needed for insomnia Continue Prozac  for GAD       RESOLVED: Grief reaction   Lost her mother in 08/25 Her current symptoms are likely related to grief reaction Reassurance provided She seems motivated to resume her daily activities and has adequate family support currently       Other Visit Diagnoses       Encounter for immunization       Relevant Orders  Flu vaccine trivalent PF, 6mos and older(Flulaval,Afluria,Fluarix,Fluzone) (Completed)           Meds ordered this encounter  Medications   DISCONTD: hydrOXYzine  (VISTARIL ) 25 MG capsule    Sig: Take 1 capsule (25 mg total) by mouth at bedtime as needed for anxiety (Insomnia).    Dispense:  30 capsule    Refill:  0    Follow-up: Return in about 4 months (around 05/10/2024) for Weight management.    Suzzane MARLA Blanch, MD "

## 2024-01-10 NOTE — Assessment & Plan Note (Signed)
 BMI Readings from Last 3 Encounters:  01/09/24 28.54 kg/m  09/10/23 29.09 kg/m  06/05/23 30.67 kg/m    She has been following low-carb diet and performs moderate exercise/walking for more than a year now Was on Wegovy  - initial BMI was 33.93 - had excellent response, but had to stop due to insurance coverage concern Continue Zepbound  through [employer], currently at 5 mg dose (takes 5 mg dose from 10 mg vial qw) - initial BMI: 30.67 She is encouraged to continue to follow low-carb diet and engage in physical exercise/walking at least 150 minutes/week  Check CMP

## 2024-01-10 NOTE — Assessment & Plan Note (Addendum)
 Currently worse due to grief reaction Usually well controlled with Prozac  60 mg QD

## 2024-01-10 NOTE — Assessment & Plan Note (Signed)
 Lost her mother in 08/25 Her current symptoms are likely related to grief reaction Reassurance provided She seems motivated to resume her daily activities and has adequate family support currently

## 2024-01-10 NOTE — Assessment & Plan Note (Signed)
 Recent grief reaction worsening her insomnia Vistaril  as needed for insomnia Continue Prozac  for GAD

## 2024-02-09 ENCOUNTER — Other Ambulatory Visit: Payer: Self-pay | Admitting: Internal Medicine

## 2024-02-09 DIAGNOSIS — G43809 Other migraine, not intractable, without status migrainosus: Secondary | ICD-10-CM

## 2024-02-14 ENCOUNTER — Other Ambulatory Visit: Payer: Self-pay | Admitting: Medical Genetics

## 2024-02-14 DIAGNOSIS — Z006 Encounter for examination for normal comparison and control in clinical research program: Secondary | ICD-10-CM

## 2024-02-19 ENCOUNTER — Encounter (INDEPENDENT_AMBULATORY_CARE_PROVIDER_SITE_OTHER): Payer: Self-pay | Admitting: Gastroenterology

## 2024-02-28 ENCOUNTER — Other Ambulatory Visit: Payer: Self-pay | Admitting: Internal Medicine

## 2024-02-28 DIAGNOSIS — F5101 Primary insomnia: Secondary | ICD-10-CM

## 2024-02-28 DIAGNOSIS — E66811 Obesity, class 1: Secondary | ICD-10-CM

## 2024-03-06 ENCOUNTER — Other Ambulatory Visit: Payer: Self-pay | Admitting: Internal Medicine

## 2024-03-06 DIAGNOSIS — G43809 Other migraine, not intractable, without status migrainosus: Secondary | ICD-10-CM

## 2024-03-09 ENCOUNTER — Encounter: Payer: Self-pay | Admitting: Radiology

## 2024-04-06 ENCOUNTER — Other Ambulatory Visit (HOSPITAL_COMMUNITY): Payer: Self-pay

## 2024-04-06 LAB — GENECONNECT MOLECULAR SCREEN: Genetic Analysis Overall Interpretation: NEGATIVE

## 2024-04-08 ENCOUNTER — Encounter: Payer: Self-pay | Admitting: Internal Medicine

## 2024-04-09 ENCOUNTER — Encounter: Payer: Self-pay | Admitting: Internal Medicine

## 2024-04-09 ENCOUNTER — Telehealth: Admitting: Internal Medicine

## 2024-04-09 DIAGNOSIS — K529 Noninfective gastroenteritis and colitis, unspecified: Secondary | ICD-10-CM | POA: Diagnosis not present

## 2024-04-09 MED ORDER — AMOXICILLIN-POT CLAVULANATE 875-125 MG PO TABS: 1.0000 | ORAL_TABLET | Freq: Two times a day (BID) | ORAL | 0 refills | Status: DC

## 2024-04-09 MED ORDER — OMEPRAZOLE 40 MG PO CPDR: 40.0000 mg | DELAYED_RELEASE_CAPSULE | Freq: Every day | ORAL | 0 refills | Status: DC

## 2024-04-09 NOTE — Progress Notes (Signed)
 Virtual Visit via Video Note   Because of Julia Rangel's co-morbid illnesses, she is at least at moderate risk for complications without adequate follow up.  This format is felt to be most appropriate for this patient at this time.  All issues noted in this document were discussed and addressed.  A limited physical exam was performed with this format.      Evaluation Performed:  Follow-up visit  Date:  04/09/2024   ID:  Julia Rangel, DOB 01/11/1986, MRN [id number]  Patient Location: Home/Car Provider Location: Office/Clinic  Participants: Patient Location of Patient: Home Location of Provider: Telehealth Consent was obtain for visit to be over via telehealth. I verified that I am speaking with the correct person using two identifiers.  PCP:  Tobie Suzzane POUR, MD   Chief Complaint: Abdominal pain/discomfort  History of Present Illness:    Julia Rangel is a 38 y.o. female who has a video visit for complaint of diffuse abdominal discomfort, bloating and burping sensation for the last 3 days.  She denies any similar episode in the past.  Denies any fever or chills.  She has mild nausea, but denies any recent episode of vomiting. She has history of IBS-D, but was actually constipated in the last few days.  She tried taking Dulcolax with mild relief. Denies melena or hematochezia recently.  The patient does not have symptoms concerning for COVID-19 infection (fever, chills, cough, or new shortness of breath).   Past Medical, Surgical, Social History, Allergies, and Medications have been Reviewed.  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   Dysplasia of cervix    Family history of BRCA1 gene positive    Family history of breast cancer    History of cesarean delivery 10/09/2016   X2 plans rpeat   History of kidney stones    Menorrhagia    Migraines    since age 36 per patient   Patient desires pregnancy 06/01/2013   PMDD  (premenstrual dysphoric disorder)    PROZAC  20 MG BUT NONE WITH PREGNANCY   PONV (postoperative nausea and vomiting)    Vaginal Pap smear, abnormal    Past Surgical History:  Procedure Laterality Date   BICEPT TENODESIS Right 11/09/2021   Procedure: BICEPS TENODESIS;  Surgeon: Onesimo Oneil LABOR, MD;  Location: [facility];  Service: Orthopedics;  Laterality: Right;   BIOPSY N/A 05/04/2021   Procedure: BIOPSY;  Surgeon: Golda Claudis PENNER, MD;  [facility];  Service: Endoscopy;  Laterality: N/A;   BIOPSY  08/07/2022   Procedure: BIOPSY;  Surgeon: Eartha Angelia Sieving, MD;  [facility];  Service: Gastroenterology;;   CESAREAN SECTION  2010   CESAREAN SECTION N/A 03/29/2014   Procedure: REPEAT CESAREAN SECTION;  Surgeon: Vonn VEAR Inch, MD;  Location: [facility];  Service: Obstetrics;  Laterality: N/A;   CESAREAN SECTION WITH BILATERAL TUBAL LIGATION Bilateral 05/08/2017   Procedure: CESAREAN SECTION WITH BILATERAL TUBAL LIGATION;  Surgeon: Edsel Norleen GAILS, MD;  Location: [facility];  Service: Obstetrics;  Laterality: Bilateral;   COLONOSCOPY N/A 05/04/2021   Procedure: COLONOSCOPY;  Surgeon: Golda Claudis PENNER, MD;  [facility];  Service: Endoscopy;  Laterality: N/A;  1245   COLONOSCOPY WITH PROPOFOL  N/A 08/07/2022   Procedure: COLONOSCOPY WITH PROPOFOL ;  Surgeon: Eartha Angelia Sieving, MD;  [facility];  Service: Gastroenterology;  Laterality: N/A;  830am, asa 2   HYSTERECTOMY  ABDOMINAL WITH SALPINGECTOMY N/A 09/15/2019   Procedure: HYSTERECTOMY ABDOMINAL and OPEN BILATERAL SALPINGECTOMY;  Surgeon: Edsel Norleen GAILS, MD;  Location: [facility];  Service: Gynecology;  Laterality: N/A;   KIDNEY STONE SURGERY  2010   stent   LEEP  2008   phylloid removed  2009   left breast   POLYPECTOMY  08/07/2022   Procedure: POLYPECTOMY INTESTINAL;  Surgeon: Eartha Angelia Sieving, MD;  [facility];  Service: Gastroenterology;;   SHOULDER ARTHROSCOPY WITH  DEBRIDEMENT AND BICEP TENDON REPAIR Right 11/09/2021   Procedure: SHOULDER ARTHROSCOPY WITH DEBRIDEMENT OF CALCIFIC TENDONITIS AND BICEP TENDON REPAIR;  Surgeon: Onesimo Oneil LABOR, MD;  Location: [facility];  Service: Orthopedics;  Laterality: Right;   TONSILLECTOMY     WISDOM TOOTH EXTRACTION       No outpatient medications have been marked as taking for the 04/09/24 encounter (Telemedicine) with Tobie Suzzane POUR, MD.     Allergies:   Patient has no known allergies.   ROS:   Please see the history of present illness. All other systems reviewed and are negative.   Labs/Other Tests and Data Reviewed:    Recent Labs: 09/11/2023: ALT 20; BUN 12; Creatinine, Ser 0.78; Hemoglobin 12.2; Platelets 268; Potassium 4.4; Sodium 142; TSH 1.820   Recent Lipid Panel Lab Results  Component Value Date/Time   CHOL 151 09/11/2023 08:06 AM   TRIG 88 09/11/2023 08:06 AM   HDL 45 09/11/2023 08:06 AM   CHOLHDL 3.4 09/11/2023 08:06 AM   CHOLHDL 3.2 03/16/2020 05:13 PM   LDLCALC 89 09/11/2023 08:06 AM   LDLCALC 139 (H) 03/16/2020 05:13 PM    Wt Readings from Last 3 Encounters:  01/09/24 176 lb 12.8 oz (80.2 kg)  09/10/23 180 lb 3.2 oz (81.7 kg)  06/05/23 190 lb (86.2 kg)     Objective:    Vital Signs:  LMP 07/14/2019 (Approximate)    VITAL SIGNS:  reviewed GEN:  no acute distress EYES:  sclerae anicteric, EOMI - Extraocular Movements Intact RESPIRATORY:  normal respiratory effort, symmetric expansion NEURO:  alert and oriented x 3, no obvious focal deficit PSYCH:  normal affect  ASSESSMENT & PLAN:    Assessment & Plan Colitis Has diffuse abdominal discomfort and bloating She has been concerned about colitis - started empiric Augmentin  for now If persistent symptoms, will need CT abdomen Omeprazole 40 mg QD, which can help with acid reflux/indigestion Advised to keep food diary MiraLAX as needed for constipation    I discussed the assessment and treatment plan with the patient. The patient  was provided an opportunity to ask questions, and all were answered. The patient agreed with the plan and demonstrated an understanding of the instructions.   The patient was advised to call back or seek an in-person evaluation if the symptoms worsen or if the condition fails to improve as anticipated.  The above assessment and management plan was discussed with the patient. The patient verbalized understanding of and has agreed to the management plan.   Medication Adjustments/Labs and Tests Ordered: Current medicines are reviewed at length with the patient today.  Concerns regarding medicines are outlined above.   Tests Ordered: No orders of the defined types were placed in this encounter.   Medication Changes: No orders of the defined types were placed in this encounter.    Note: This dictation was prepared with Dragon dictation along with smaller phrase technology. Similar sounding words can be transcribed inadequately or may not be corrected upon review. Any transcriptional errors  that result from this process are unintentional.      Disposition:  Follow up  Signed, Suzzane MARLA Blanch, MD  04/09/2024 4:32 PM     [facility]

## 2024-04-09 NOTE — Patient Instructions (Signed)
 Please start taking Augmentin  as prescribed.  Please start taking omeprazole as prescribed.  Please keep food diary to check for association of your symptoms with any particular food.

## 2024-04-09 NOTE — Assessment & Plan Note (Addendum)
 Has diffuse abdominal discomfort and bloating She has been concerned about colitis - started empiric Augmentin  for now If persistent symptoms, will need CT abdomen Omeprazole 40 mg QD, which can help with acid reflux/indigestion Advised to keep food diary MiraLAX as needed for constipation

## 2024-04-09 NOTE — Telephone Encounter (Signed)
 Scheduled

## 2024-04-09 NOTE — Telephone Encounter (Signed)
 Called patient & left voice mail.

## 2024-05-04 ENCOUNTER — Other Ambulatory Visit: Payer: Self-pay | Admitting: Internal Medicine

## 2024-05-04 DIAGNOSIS — K529 Noninfective gastroenteritis and colitis, unspecified: Secondary | ICD-10-CM

## 2024-05-14 ENCOUNTER — Encounter: Payer: Self-pay | Admitting: Internal Medicine

## 2024-05-14 ENCOUNTER — Ambulatory Visit (INDEPENDENT_AMBULATORY_CARE_PROVIDER_SITE_OTHER): Admitting: Internal Medicine

## 2024-05-14 VITALS — BP 119/79 | HR 79 | Ht 66.0 in | Wt 178.0 lb

## 2024-05-14 DIAGNOSIS — E663 Overweight: Secondary | ICD-10-CM | POA: Diagnosis not present

## 2024-05-14 DIAGNOSIS — G43709 Chronic migraine without aura, not intractable, without status migrainosus: Secondary | ICD-10-CM

## 2024-05-14 DIAGNOSIS — K580 Irritable bowel syndrome with diarrhea: Secondary | ICD-10-CM

## 2024-05-14 DIAGNOSIS — F411 Generalized anxiety disorder: Secondary | ICD-10-CM | POA: Diagnosis not present

## 2024-05-14 LAB — CMP14+EGFR
ALT: 17 IU/L (ref 0–32)
AST: 15 IU/L (ref 0–40)
Albumin: 4.7 g/dL (ref 3.9–4.9)
Alkaline Phosphatase: 70 IU/L (ref 41–116)
BUN/Creatinine Ratio: 19 (ref 9–23)
BUN: 20 mg/dL (ref 6–20)
Bilirubin Total: <0.2 mg/dL (ref 0.0–1.2)
CO2: 24 mmol/L (ref 20–29)
Calcium: 9.4 mg/dL (ref 8.7–10.2)
Chloride: 101 mmol/L (ref 96–106)
Creatinine, Ser: 1.06 mg/dL — ABNORMAL HIGH (ref 0.57–1.00)
Globulin, Total: 2.4 g/dL (ref 1.5–4.5)
Glucose: 95 mg/dL (ref 70–99)
Potassium: 4.6 mmol/L (ref 3.5–5.2)
Sodium: 136 mmol/L (ref 134–144)
Total Protein: 7.1 g/dL (ref 6.0–8.5)
eGFR: 69 mL/min/1.73

## 2024-05-14 NOTE — Patient Instructions (Signed)
 Please continue to take medications as prescribed.  Please continue to follow low carb diet and perform moderate exercise/walking at least 150 mins/week.

## 2024-05-14 NOTE — Assessment & Plan Note (Signed)
 Well controlled with Emgality and as needed Imitrex Does not have a neurologist currently

## 2024-05-14 NOTE — Assessment & Plan Note (Signed)
 Chronic, watery to loose BM -likely IBS-D Followed by GI On Levsin  now -provides minimal relief Has tried dicyclomine , Benefiber and probiotics without any relief Recently had colitis, improved with Augmentin  Completed Xifaxan , which had improved symptoms overall

## 2024-05-14 NOTE — Assessment & Plan Note (Signed)
 Usually well controlled with Prozac  60 mg QD

## 2024-05-14 NOTE — Progress Notes (Signed)
 "  Established Patient Office Visit  Subjective:  Patient ID: Julia Rangel, female    DOB: 1985-06-25  Age: 39 y.o. MRN: [id number]  CC:  Chief Complaint  Patient presents with   Follow-up    Would like to discuss wegovy     HPI Julia Rangel is a 39 y.o. female with past medical history of migraine, IDA, HLD and obesity who presents for f/u of her chronic medical conditions.  Migraine: Currently well controlled with Emgality  and as needed Imitrex .  She requires Imitrex  very rarely now.   She has history of obesity, for which she has been following low-carb diet and has continued regular exercise regimen for more than a year now.  She was tolerating Zepbound , used to get it through [employer] direct program.  She had lost about 20 lbs with it in 6 months. She stopped Zepbound  later as she was having food aversion with it. She had been on Wegovy  in the past.  She had lost about 30 lbs with Wegovy . She lost coverage for Wegovy  in 04/24, but prefers to start Wegovy  tablets now. She has tried compounded semaglutide  injections through [facility] hormone clinic, but could not continue due to lack of efficacy.  She has chronic diarrhea, which has improved with Xifaxan .  She still takes Levsin  as needed for abdominal cramping.  Her abdominal pain and cramping before and during bowel movement have also improved.  She also had rectal bleeding after bowel movement from hemorrhoids, but has improved now.  She takes Prozac  60 mg once daily for GAD.  Denies anhedonia, SI or HI currently. She tries to keep herself motivated and has continued to work as a psychologist, forensic.  Denies delusions, hallucinations, SI or HI currently.  Her thyroid  function tests have been wnl without NP thyroid  now.  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   Dysplasia of cervix    Family history of BRCA1 gene positive    Family history of breast cancer    History of cesarean  delivery 10/09/2016   X2 plans rpeat   History of kidney stones    Menorrhagia    Migraines    since age 35 per patient   Patient desires pregnancy 06/01/2013   PMDD (premenstrual dysphoric disorder)    PROZAC  20 MG BUT NONE WITH PREGNANCY   PONV (postoperative nausea and vomiting)    Vaginal Pap smear, abnormal     Past Surgical History:  Procedure Laterality Date   BICEPT TENODESIS Right 11/09/2021   Procedure: BICEPS TENODESIS;  Surgeon: Onesimo Oneil LABOR, MD;  Location: [facility];  Service: Orthopedics;  Laterality: Right;   BIOPSY N/A 05/04/2021   Procedure: BIOPSY;  Surgeon: Golda Claudis PENNER, MD;  Location: [facility];  Service: Endoscopy;  Laterality: N/A;   BIOPSY  08/07/2022   Procedure: BIOPSY;  Surgeon: Eartha Angelia Sieving, MD;  Location: [facility];  Service: Gastroenterology;;   CESAREAN SECTION  2010   CESAREAN SECTION N/A 03/29/2014   Procedure: REPEAT CESAREAN SECTION;  Surgeon: Vonn VEAR Inch, MD;  Location: [facility];  Service: Obstetrics;  Laterality: N/A;   CESAREAN SECTION WITH BILATERAL TUBAL LIGATION Bilateral 05/08/2017   Procedure: CESAREAN SECTION WITH BILATERAL TUBAL LIGATION;  Surgeon: Edsel Norleen GAILS, MD;  Location: [facility];  Service: Obstetrics;  Laterality: Bilateral;   COLONOSCOPY N/A 05/04/2021   Procedure: COLONOSCOPY;  Surgeon: Golda Claudis PENNER, MD;  Location: [facility];  Service: Endoscopy;  Laterality: N/A;  1245   COLONOSCOPY WITH PROPOFOL  N/A 08/07/2022   Procedure: COLONOSCOPY WITH PROPOFOL ;  Surgeon: Eartha Angelia Sieving, MD;  Location: [facility];  Service: Gastroenterology;  Laterality: N/A;  830am, asa 2   HYSTERECTOMY ABDOMINAL WITH SALPINGECTOMY N/A 09/15/2019   Procedure: HYSTERECTOMY ABDOMINAL and OPEN BILATERAL SALPINGECTOMY;  Surgeon: Edsel Norleen GAILS, MD;  Location: [facility];  Service: Gynecology;  Laterality: N/A;   KIDNEY STONE SURGERY  2010   stent   LEEP  2008   phylloid removed  2009   left breast    POLYPECTOMY  08/07/2022   Procedure: POLYPECTOMY INTESTINAL;  Surgeon: Eartha Angelia Sieving, MD;  Location: [facility];  Service: Gastroenterology;;   SHOULDER ARTHROSCOPY WITH DEBRIDEMENT AND BICEP TENDON REPAIR Right 11/09/2021   Procedure: SHOULDER ARTHROSCOPY WITH DEBRIDEMENT OF CALCIFIC TENDONITIS AND BICEP TENDON REPAIR;  Surgeon: Onesimo Oneil LABOR, MD;  Location: [facility];  Service: Orthopedics;  Laterality: Right;   TONSILLECTOMY     WISDOM TOOTH EXTRACTION      Family History  Problem Relation Age of Onset   Diabetes Maternal Grandmother    Breast cancer Maternal Grandmother        dx 40s-50   Diabetes Paternal Grandmother    Breast cancer Paternal Grandmother 68   COPD Paternal Grandfather    Heart disease Maternal Grandfather    Breast cancer Mother 81   BRCA 1/2 Mother        BRCA1 pos   Ovarian cancer Mother    Other Mother        peritoneal cancer   Colon polyps Maternal Uncle    SIDS Maternal Aunt        2 babies died of SIDS at 93 months    Social History   Socioeconomic History   Marital status: Married    Spouse name: Not on file   Number of children: 3   Years of education: Not on file   Highest education level: Not on file  Occupational History   Not on file  Tobacco Use   Smoking status: Never   Smokeless tobacco: Never  Vaping Use   Vaping status: Never Used  Substance and Sexual Activity   Alcohol use: Yes    Alcohol/week: 7.0 standard drinks of alcohol    Types: 7 Glasses of wine per week   Drug use: No   Sexual activity: Not Currently    Birth control/protection: Surgical    Comment: tubal,hysterectomy  Other Topics Concern   Not on file  Social History Narrative   Married for 6 years.Lives with husband and 3 kids.Teacher at [employer].   Social Drivers of Health   Tobacco Use: Low Risk (05/14/2024)   Patient History    Smoking Tobacco Use: Never    Smokeless Tobacco Use: Never    Passive Exposure: Not on file   Financial Resource Strain: Not on file  Food Insecurity: Not on file  Transportation Needs: Not on file  Physical Activity: Not on file  Stress: Not on file  Social Connections: Not on file  Intimate Partner Violence: Not on file  Depression (PHQ2-9): High Risk (01/09/2024)   Depression (PHQ2-9)    PHQ-2 Score: 14  Alcohol Screen: Not on file  Housing: Not on file  Utilities: Not on file  Health Literacy: Not on file    Outpatient Medications Prior to Visit  Medication Sig Dispense Refill   Bacillus Coagulans-Inulin (PROBIOTIC-PREBIOTIC PO) Take  2 tablets by mouth in the morning. Lemme Debloat - [facility] Gummies with Probiotics & Prebiotic     EMGALITY  120 MG/ML SOAJ INJECT CONTENTS OF 1 PEN SUBCUTANEOUSLY ONCE EVERY MONTH 1 mL 5   FLUoxetine  (PROZAC ) 40 MG capsule Take 80 mg by mouth daily.     hydrocortisone  2.5 % cream Apply 1 Application topically 2 (two) times daily as needed (skin irritation.).     hydrOXYzine  (VISTARIL ) 25 MG capsule TAKE 1 CAPSULE BY MOUTH AT BEDTIME AS NEEDED FOR ANXIETY (INSOMNIA). 30 capsule 2   hyoscyamine  (LEVSIN ) 0.125 MG tablet Take 1 tablet (0.125 mg total) by mouth every 8 (eight) hours as needed. 30 tablet 0   MELATONIN-THEANINE PO Take 2 tablets by mouth at bedtime. Lemme Sleep Gummies with 5mg  Melatonin, Elderberry, Magnesium, L-Theanine, Chamomile and Lavender     omeprazole  (PRILOSEC) 40 MG capsule Take 1 capsule by mouth once daily 90 capsule 0   ondansetron  (ZOFRAN ) 4 MG tablet Take 4 mg by mouth every 8 (eight) hours as needed for nausea or vomiting.     SUMAtriptan  (IMITREX ) 100 MG tablet Take 100 mg by mouth every 2 (two) hours as needed for migraine. May repeat in 2 hours if headache persists or recurs.     tretinoin (RETIN-A) 0.025 % cream Apply 1 Application topically in the morning.     amoxicillin -clavulanate (AUGMENTIN ) 875-125 MG tablet Take 1 tablet by mouth 2 (two) times daily. 14 tablet 0   No facility-administered  medications prior to visit.    No Known Allergies  ROS Review of Systems  Constitutional:  Negative for chills and fever.  HENT:  Negative for congestion, sinus pressure, sinus pain and sore throat.   Eyes:  Negative for pain and discharge.  Respiratory:  Negative for cough and shortness of breath.   Cardiovascular:  Negative for chest pain and palpitations.  Gastrointestinal:  Negative for abdominal pain, diarrhea, nausea and vomiting.  Endocrine: Negative for polydipsia and polyuria.  Genitourinary:  Negative for dysuria and hematuria.  Musculoskeletal:  Negative for neck pain and neck stiffness.  Skin:  Negative for rash.  Neurological:  Positive for tremors. Negative for dizziness and weakness.  Psychiatric/Behavioral:  Positive for dysphoric mood and sleep disturbance. Negative for agitation and behavioral problems. The patient is nervous/anxious.       Objective:    Physical Exam Vitals reviewed.  Constitutional:      General: She is not in acute distress.    Appearance: She is obese. She is not diaphoretic.  HENT:     Head: Normocephalic and atraumatic.     Nose: Nose normal.     Mouth/Throat:     Mouth: Mucous membranes are moist.  Eyes:     General: No scleral icterus.    Extraocular Movements: Extraocular movements intact.  Cardiovascular:     Rate and Rhythm: Normal rate and regular rhythm.     Heart sounds: Normal heart sounds. No murmur heard. Pulmonary:     Breath sounds: Normal breath sounds. No wheezing or rales.  Musculoskeletal:     Cervical back: Neck supple. No tenderness.     Right lower leg: No edema.     Left lower leg: No edema.  Skin:    General: Skin is warm.     Findings: No rash.  Neurological:     General: No focal deficit present.     Mental Status: She is alert and oriented to person, place, and time.  Psychiatric:  Mood and Affect: Mood normal.        Behavior: Behavior normal.     BP 119/79   Pulse 79   Ht 5' 6 (1.676  m)   Wt 178 lb (80.7 kg)   LMP 07/14/2019   SpO2 99%   BMI 28.73 kg/m  Wt Readings from Last 3 Encounters:  05/14/24 178 lb (80.7 kg)  01/09/24 176 lb 12.8 oz (80.2 kg)  09/10/23 180 lb 3.2 oz (81.7 kg)    Lab Results  Component Value Date   TSH 1.820 09/11/2023   Lab Results  Component Value Date   WBC 4.3 09/11/2023   HGB 12.2 09/11/2023   HCT 35.5 09/11/2023   MCV 92 09/11/2023   PLT 268 09/11/2023   Lab Results  Component Value Date   NA 136 05/13/2024   K 4.6 05/13/2024   CO2 24 05/13/2024   GLUCOSE 95 05/13/2024   BUN 20 05/13/2024   CREATININE 1.06 (H) 05/13/2024   BILITOT <0.2 05/13/2024   ALKPHOS 70 05/13/2024   AST 15 05/13/2024   ALT 17 05/13/2024   PROT 7.1 05/13/2024   ALBUMIN  4.7 05/13/2024   CALCIUM 9.4 05/13/2024   ANIONGAP 11 11/02/2022   EGFR 69 05/13/2024   Lab Results  Component Value Date   CHOL 151 09/11/2023   Lab Results  Component Value Date   HDL 45 09/11/2023   Lab Results  Component Value Date   LDLCALC 89 09/11/2023   Lab Results  Component Value Date   TRIG 88 09/11/2023   Lab Results  Component Value Date   CHOLHDL 3.4 09/11/2023   Lab Results  Component Value Date   HGBA1C 5.0 09/11/2023      Assessment & Plan:   Problem List Items Addressed This Visit       Cardiovascular and Mediastinum   Migraine (Chronic)   Well controlled with Emgality  and as needed Imitrex  Does not have a neurologist currently        Digestive   Irritable bowel syndrome with diarrhea   Chronic, watery to loose BM -likely IBS-D Followed by GI On Levsin  now -provides minimal relief Has tried dicyclomine , Benefiber and probiotics without any relief Recently had colitis, improved with Augmentin  Completed Xifaxan , which had improved symptoms overall        Other   Overweight - Primary   BMI Readings from Last 3 Encounters:  05/14/24 28.73 kg/m  01/09/24 28.54 kg/m  09/10/23 29.09 kg/m    She has been following  low-carb diet and performs moderate exercise/walking for more than a year now Was on Wegovy  pen - initial BMI was 33.93 - had excellent response, but had to stop due to insurance coverage concern Had to stop Zepbound  due to food aversion She prefers to try Wegovy  1.5 mg tablets - but advised to contact after 2 weeks as EMR setup for ordering and local supplies have not be arranged yet She is encouraged to continue to follow low-carb diet and engage in physical exercise/walking at least 150 minutes/week  Checked CMP      GAD (generalized anxiety disorder)   Usually well controlled with Prozac  60 mg QD           No orders of the defined types were placed in this encounter.   Follow-up: Return in about 6 months (around 11/11/2024) for Migraine and weight management.    Suzzane MARLA Blanch, MD "

## 2024-05-14 NOTE — Assessment & Plan Note (Addendum)
 BMI Readings from Last 3 Encounters:  05/14/24 28.73 kg/m  01/09/24 28.54 kg/m  09/10/23 29.09 kg/m    She has been following low-carb diet and performs moderate exercise/walking for more than a year now Was on Wegovy  pen - initial BMI was 33.93 - had excellent response, but had to stop due to insurance coverage concern Had to stop Zepbound  due to food aversion She prefers to try Wegovy  1.5 mg tablets - but advised to contact after 2 weeks as EMR setup for ordering and local supplies have not be arranged yet She is encouraged to continue to follow low-carb diet and engage in physical exercise/walking at least 150 minutes/week  Checked CMP

## 2024-05-28 ENCOUNTER — Encounter: Payer: Self-pay | Admitting: Internal Medicine

## 2024-06-05 ENCOUNTER — Other Ambulatory Visit: Payer: Self-pay | Admitting: Internal Medicine

## 2024-06-05 DIAGNOSIS — E663 Overweight: Secondary | ICD-10-CM

## 2024-06-05 MED ORDER — WEGOVY 1.5 MG PO TABS: 1.5000 mg | ORAL_TABLET | Freq: Every day | ORAL | 2 refills | Status: AC

## 2024-06-08 ENCOUNTER — Telehealth: Payer: Self-pay | Admitting: Pharmacy Technician

## 2024-06-08 ENCOUNTER — Other Ambulatory Visit (HOSPITAL_COMMUNITY): Payer: Self-pay

## 2024-06-08 NOTE — Telephone Encounter (Signed)
 Pharmacy Patient Advocate Encounter   Received notification from [facility] that prior authorization for Wegovy  1.5mg  tablets is required/requested.   Insurance verification completed.   The patient is insured through [facility].   Per test claim: Per test claim, medication is not covered due to plan/benefit exclusion, PA not submitted at this time Archived Key: [id number].

## 2024-11-12 ENCOUNTER — Ambulatory Visit: Payer: Self-pay | Admitting: Internal Medicine

## 2024-11-26 ENCOUNTER — Ambulatory Visit: Payer: Self-pay | Admitting: Internal Medicine
